# Patient Record
Sex: Female | Born: 1945 | Race: White | Hispanic: No | Smoking: Former smoker
Health system: Southern US, Community
[De-identification: ages and names within clinical notes are randomized; demographics above are authoritative.]

## PROBLEM LIST (undated history)

## (undated) DIAGNOSIS — E785 Hyperlipidemia, unspecified: Secondary | ICD-10-CM

## (undated) DIAGNOSIS — K624 Stenosis of anus and rectum: Secondary | ICD-10-CM

## (undated) DIAGNOSIS — K644 Residual hemorrhoidal skin tags: Secondary | ICD-10-CM

## (undated) DIAGNOSIS — I10 Essential (primary) hypertension: Secondary | ICD-10-CM

## (undated) DIAGNOSIS — N3281 Overactive bladder: Secondary | ICD-10-CM

## (undated) DIAGNOSIS — R000 Tachycardia, unspecified: Secondary | ICD-10-CM

## (undated) DIAGNOSIS — I77811 Abdominal aortic ectasia: Secondary | ICD-10-CM

## (undated) DIAGNOSIS — IMO0001 Reserved for inherently not codable concepts without codable children: Secondary | ICD-10-CM

## (undated) DIAGNOSIS — G459 Transient cerebral ischemic attack, unspecified: Secondary | ICD-10-CM

## (undated) DIAGNOSIS — I739 Peripheral vascular disease, unspecified: Secondary | ICD-10-CM

## (undated) DIAGNOSIS — IMO0002 Reserved for concepts with insufficient information to code with codable children: Secondary | ICD-10-CM

## (undated) DIAGNOSIS — Z923 Personal history of irradiation: Secondary | ICD-10-CM

## (undated) DIAGNOSIS — F4321 Adjustment disorder with depressed mood: Secondary | ICD-10-CM

## (undated) DIAGNOSIS — K589 Irritable bowel syndrome without diarrhea: Secondary | ICD-10-CM

## (undated) DIAGNOSIS — I679 Cerebrovascular disease, unspecified: Secondary | ICD-10-CM

## (undated) DIAGNOSIS — F419 Anxiety disorder, unspecified: Secondary | ICD-10-CM

## (undated) DIAGNOSIS — I951 Orthostatic hypotension: Secondary | ICD-10-CM

## (undated) DIAGNOSIS — C50919 Malignant neoplasm of unspecified site of unspecified female breast: Secondary | ICD-10-CM

## (undated) DIAGNOSIS — I872 Venous insufficiency (chronic) (peripheral): Secondary | ICD-10-CM

## (undated) DIAGNOSIS — K5900 Constipation, unspecified: Secondary | ICD-10-CM

## (undated) DIAGNOSIS — K602 Anal fissure, unspecified: Secondary | ICD-10-CM

## (undated) DIAGNOSIS — K222 Esophageal obstruction: Secondary | ICD-10-CM

## (undated) DIAGNOSIS — L814 Other melanin hyperpigmentation: Secondary | ICD-10-CM

## (undated) DIAGNOSIS — K200 Eosinophilic esophagitis: Secondary | ICD-10-CM

## (undated) DIAGNOSIS — K219 Gastro-esophageal reflux disease without esophagitis: Secondary | ICD-10-CM

## (undated) DIAGNOSIS — R32 Unspecified urinary incontinence: Secondary | ICD-10-CM

## (undated) HISTORY — DX: Reserved for concepts with insufficient information to code with codable children: IMO0002

## (undated) HISTORY — PX: ABDOMINAL HYSTERECTOMY: SHX81

## (undated) HISTORY — DX: Anxiety disorder, unspecified: F41.9

## (undated) HISTORY — DX: Abdominal aortic ectasia: I77.811

## (undated) HISTORY — DX: Anal fissure, unspecified: K60.2

## (undated) HISTORY — PX: BREAST SURGERY: SHX581

## (undated) HISTORY — DX: Eosinophilic esophagitis: K20.0

## (undated) HISTORY — DX: Constipation, unspecified: K59.00

## (undated) HISTORY — DX: Residual hemorrhoidal skin tags: K64.4

## (undated) HISTORY — DX: Irritable bowel syndrome without diarrhea: K58.9

## (undated) HISTORY — DX: Hyperlipidemia, unspecified: E78.5

## (undated) HISTORY — PX: OTHER SURGICAL HISTORY: SHX169

## (undated) HISTORY — DX: Adjustment disorder with depressed mood: F43.21

## (undated) HISTORY — DX: Tachycardia, unspecified: R00.0

## (undated) HISTORY — DX: Unspecified urinary incontinence: R32

## (undated) HISTORY — DX: Malignant neoplasm of unspecified site of unspecified female breast: C50.919

## (undated) HISTORY — DX: Peripheral vascular disease, unspecified: I73.9

## (undated) HISTORY — DX: Esophageal obstruction: K22.2

## (undated) HISTORY — DX: Venous insufficiency (chronic) (peripheral): I87.2

## (undated) HISTORY — DX: Essential (primary) hypertension: I10

## (undated) HISTORY — DX: Gastro-esophageal reflux disease without esophagitis: K21.9

## (undated) HISTORY — DX: Reserved for inherently not codable concepts without codable children: IMO0001

## (undated) HISTORY — DX: Cerebrovascular disease, unspecified: I67.9

## (undated) HISTORY — DX: Transient cerebral ischemic attack, unspecified: G45.9

## (undated) HISTORY — PX: CARDIAC CATHETERIZATION: SHX172

## (undated) HISTORY — DX: Stenosis of anus and rectum: K62.4

## (undated) HISTORY — DX: Orthostatic hypotension: I95.1

## (undated) HISTORY — DX: Other melanin hyperpigmentation: L81.4

## (undated) HISTORY — DX: Irritable bowel syndrome, unspecified: K58.9

## (undated) HISTORY — DX: Overactive bladder: N32.81

---

## 1970-10-22 HISTORY — PX: BREAST EXCISIONAL BIOPSY: SUR124

## 2000-07-24 ENCOUNTER — Encounter (INDEPENDENT_AMBULATORY_CARE_PROVIDER_SITE_OTHER): Payer: Self-pay | Admitting: Specialist

## 2000-07-24 ENCOUNTER — Other Ambulatory Visit: Admission: RE | Admit: 2000-07-24 | Discharge: 2000-07-24 | Payer: Self-pay | Admitting: Gastroenterology

## 2002-12-18 ENCOUNTER — Encounter: Payer: Self-pay | Admitting: Internal Medicine

## 2002-12-18 ENCOUNTER — Observation Stay (HOSPITAL_COMMUNITY): Admission: EM | Admit: 2002-12-18 | Discharge: 2002-12-19 | Payer: Self-pay | Admitting: Emergency Medicine

## 2002-12-18 ENCOUNTER — Encounter: Payer: Self-pay | Admitting: Emergency Medicine

## 2003-09-15 ENCOUNTER — Ambulatory Visit (HOSPITAL_COMMUNITY): Admission: RE | Admit: 2003-09-15 | Discharge: 2003-09-15 | Payer: Self-pay | Admitting: Cardiology

## 2003-09-30 ENCOUNTER — Ambulatory Visit (HOSPITAL_COMMUNITY): Admission: RE | Admit: 2003-09-30 | Discharge: 2003-09-30 | Payer: Self-pay | Admitting: Gastroenterology

## 2004-08-30 ENCOUNTER — Encounter: Admission: RE | Admit: 2004-08-30 | Discharge: 2004-08-30 | Payer: Self-pay | Admitting: Orthopedic Surgery

## 2004-09-11 ENCOUNTER — Ambulatory Visit: Payer: Self-pay | Admitting: Internal Medicine

## 2004-09-19 ENCOUNTER — Ambulatory Visit: Payer: Self-pay | Admitting: Internal Medicine

## 2004-09-19 ENCOUNTER — Encounter
Admission: RE | Admit: 2004-09-19 | Discharge: 2004-10-19 | Payer: Self-pay | Admitting: Physical Medicine and Rehabilitation

## 2004-10-31 ENCOUNTER — Ambulatory Visit: Payer: Self-pay | Admitting: Internal Medicine

## 2005-01-11 ENCOUNTER — Ambulatory Visit: Payer: Self-pay | Admitting: Internal Medicine

## 2005-01-13 ENCOUNTER — Ambulatory Visit (HOSPITAL_COMMUNITY): Admission: RE | Admit: 2005-01-13 | Discharge: 2005-01-13 | Payer: Self-pay | Admitting: Internal Medicine

## 2005-01-15 ENCOUNTER — Ambulatory Visit: Payer: Self-pay | Admitting: Internal Medicine

## 2005-02-20 ENCOUNTER — Ambulatory Visit: Payer: Self-pay | Admitting: Internal Medicine

## 2005-04-13 ENCOUNTER — Ambulatory Visit: Payer: Self-pay | Admitting: Gastroenterology

## 2005-04-23 ENCOUNTER — Ambulatory Visit: Payer: Self-pay | Admitting: Internal Medicine

## 2005-05-08 ENCOUNTER — Ambulatory Visit: Payer: Self-pay | Admitting: Gastroenterology

## 2005-05-14 ENCOUNTER — Ambulatory Visit: Payer: Self-pay | Admitting: Gastroenterology

## 2005-05-17 ENCOUNTER — Ambulatory Visit: Payer: Self-pay | Admitting: Internal Medicine

## 2005-07-20 ENCOUNTER — Ambulatory Visit: Payer: Self-pay | Admitting: Internal Medicine

## 2005-08-02 ENCOUNTER — Ambulatory Visit: Payer: Self-pay | Admitting: Internal Medicine

## 2005-09-14 ENCOUNTER — Ambulatory Visit: Payer: Self-pay | Admitting: Internal Medicine

## 2005-09-21 ENCOUNTER — Ambulatory Visit: Payer: Self-pay | Admitting: Internal Medicine

## 2006-01-31 ENCOUNTER — Ambulatory Visit: Payer: Self-pay | Admitting: Gastroenterology

## 2006-04-01 ENCOUNTER — Ambulatory Visit: Payer: Self-pay | Admitting: Internal Medicine

## 2006-04-16 ENCOUNTER — Ambulatory Visit: Payer: Self-pay | Admitting: Internal Medicine

## 2006-04-25 ENCOUNTER — Ambulatory Visit: Payer: Self-pay | Admitting: Internal Medicine

## 2006-06-12 ENCOUNTER — Ambulatory Visit: Payer: Self-pay | Admitting: Cardiology

## 2006-06-19 ENCOUNTER — Ambulatory Visit: Payer: Self-pay | Admitting: Internal Medicine

## 2006-07-08 ENCOUNTER — Ambulatory Visit: Payer: Self-pay | Admitting: Internal Medicine

## 2006-08-08 ENCOUNTER — Ambulatory Visit: Payer: Self-pay | Admitting: Internal Medicine

## 2006-10-17 ENCOUNTER — Ambulatory Visit: Payer: Self-pay | Admitting: Internal Medicine

## 2006-10-17 LAB — CONVERTED CEMR LAB
Chol/HDL Ratio, serum: 3.5
Cholesterol: 207 mg/dL (ref 0–200)
HDL: 58.6 mg/dL (ref >39.0)
LDL DIRECT: 134.5 mg/dL
Triglyceride fasting, serum: 58 mg/dL (ref 0–149)
VLDL: 12 mg/dL (ref 0–40)

## 2006-10-30 ENCOUNTER — Ambulatory Visit: Payer: Self-pay | Admitting: Internal Medicine

## 2006-10-30 HISTORY — PX: ELECTROCARDIOGRAM: SHX264

## 2007-04-11 ENCOUNTER — Ambulatory Visit: Payer: Self-pay | Admitting: Gastroenterology

## 2007-04-11 LAB — CONVERTED CEMR LAB
ALT: 22 units/L (ref 0–40)
AST: 22 units/L (ref 0–37)
Albumin: 3.9 g/dL (ref 3.5–5.2)
Alkaline Phosphatase: 78 units/L (ref 39–117)
Amylase: 69 units/L (ref 27–131)
BUN: 17 mg/dL (ref 6–23)
Basophils Absolute: 0.1 10*3/uL (ref 0.0–0.1)
Basophils Relative: 0.6 % (ref 0.0–1.0)
Bilirubin, Direct: 0.2 mg/dL (ref 0.0–0.3)
CO2: 34 meq/L — ABNORMAL HIGH (ref 19–32)
Calcium: 9.5 mg/dL (ref 8.4–10.5)
Chloride: 103 meq/L (ref 96–112)
Creatinine, Ser: 1 mg/dL (ref 0.4–1.2)
Eosinophils Absolute: 0.1 10*3/uL (ref 0.0–0.6)
Eosinophils Relative: 1.3 % (ref 0.0–5.0)
GFR calc Af Amer: 72 mL/min
GFR calc non Af Amer: 60 mL/min
Glucose, Bld: 94 mg/dL (ref 70–99)
HCT: 37.6 % (ref 36.0–46.0)
Hemoglobin: 12.8 g/dL (ref 12.0–15.0)
Lipase: 27 units/L (ref 11.0–59.0)
Lymphocytes Relative: 12.5 % (ref 12.0–46.0)
MCHC: 34.1 g/dL (ref 30.0–36.0)
MCV: 93 fL (ref 78.0–100.0)
Monocytes Absolute: 0.5 10*3/uL (ref 0.2–0.7)
Monocytes Relative: 6.3 % (ref 3.0–11.0)
Neutro Abs: 6.8 10*3/uL (ref 1.4–7.7)
Neutrophils Relative %: 79.3 % — ABNORMAL HIGH (ref 43.0–77.0)
Platelets: 282 10*3/uL (ref 150–400)
Potassium: 4.3 meq/L (ref 3.5–5.1)
RBC: 4.04 M/uL (ref 3.87–5.11)
RDW: 11.7 % (ref 11.5–14.6)
Sed Rate: 11 mm/hr (ref 0–25)
Sodium: 140 meq/L (ref 135–145)
TSH: 2.68 microintl units/mL (ref 0.35–5.50)
Total Bilirubin: 1.3 mg/dL — ABNORMAL HIGH (ref 0.3–1.2)
Total Protein: 6.7 g/dL (ref 6.0–8.3)
WBC: 8.6 10*3/uL (ref 4.5–10.5)

## 2007-04-15 ENCOUNTER — Ambulatory Visit: Payer: Self-pay | Admitting: Gastroenterology

## 2007-04-21 ENCOUNTER — Ambulatory Visit: Payer: Self-pay | Admitting: Gastroenterology

## 2007-04-21 HISTORY — PX: OTHER SURGICAL HISTORY: SHX169

## 2007-05-10 ENCOUNTER — Encounter: Payer: Self-pay | Admitting: Internal Medicine

## 2007-05-10 DIAGNOSIS — I10 Essential (primary) hypertension: Secondary | ICD-10-CM | POA: Insufficient documentation

## 2007-05-10 DIAGNOSIS — E785 Hyperlipidemia, unspecified: Secondary | ICD-10-CM | POA: Insufficient documentation

## 2007-05-10 DIAGNOSIS — K219 Gastro-esophageal reflux disease without esophagitis: Secondary | ICD-10-CM

## 2007-05-10 HISTORY — DX: Gastro-esophageal reflux disease without esophagitis: K21.9

## 2007-05-10 HISTORY — DX: Hyperlipidemia, unspecified: E78.5

## 2007-05-10 HISTORY — DX: Essential (primary) hypertension: I10

## 2007-05-20 ENCOUNTER — Ambulatory Visit: Payer: Self-pay | Admitting: Gastroenterology

## 2007-07-31 ENCOUNTER — Ambulatory Visit: Payer: Self-pay | Admitting: Internal Medicine

## 2007-08-02 ENCOUNTER — Ambulatory Visit: Payer: Self-pay | Admitting: Family Medicine

## 2007-08-25 LAB — CONVERTED CEMR LAB: Pap Smear: NORMAL

## 2007-11-19 ENCOUNTER — Ambulatory Visit: Payer: Self-pay | Admitting: Internal Medicine

## 2007-11-19 LAB — CONVERTED CEMR LAB
ALT: 18 units/L (ref 0–35)
AST: 24 units/L (ref 0–37)
Albumin: 4.3 g/dL (ref 3.5–5.2)
Alkaline Phosphatase: 72 units/L (ref 39–117)
BUN: 14 mg/dL (ref 6–23)
Basophils Absolute: 0.1 10*3/uL (ref 0.0–0.1)
Basophils Relative: 1 % (ref 0.0–1.0)
Bilirubin Urine: NEGATIVE
Bilirubin, Direct: 0.2 mg/dL (ref 0.0–0.3)
CO2: 33 meq/L — ABNORMAL HIGH (ref 19–32)
Calcium: 10.2 mg/dL (ref 8.4–10.5)
Chloride: 103 meq/L (ref 96–112)
Cholesterol: 229 mg/dL (ref 0–200)
Creatinine, Ser: 0.7 mg/dL (ref 0.4–1.2)
Direct LDL: 137.1 mg/dL
Eosinophils Absolute: 0.2 10*3/uL (ref 0.0–0.6)
Eosinophils Relative: 2.5 % (ref 0.0–5.0)
GFR calc Af Amer: 108 mL/min
GFR calc non Af Amer: 90 mL/min
Glucose, Bld: 100 mg/dL — ABNORMAL HIGH (ref 70–99)
HCT: 38 % (ref 36.0–46.0)
HDL: 60.7 mg/dL (ref >39.0)
Hemoglobin: 12.7 g/dL (ref 12.0–15.0)
Ketones, ur: NEGATIVE mg/dL
Leukocytes, UA: NEGATIVE
Lymphocytes Relative: 16.4 % (ref 12.0–46.0)
MCHC: 33.3 g/dL (ref 30.0–36.0)
MCV: 94.7 fL (ref 78.0–100.0)
Monocytes Absolute: 0.4 10*3/uL (ref 0.2–0.7)
Monocytes Relative: 5.1 % (ref 3.0–11.0)
Neutro Abs: 6.2 10*3/uL (ref 1.4–7.7)
Neutrophils Relative %: 75 % (ref 43.0–77.0)
Nitrite: NEGATIVE
Platelets: 267 10*3/uL (ref 150–400)
Potassium: 4.8 meq/L (ref 3.5–5.1)
RBC: 4.01 M/uL (ref 3.87–5.11)
RDW: 11.6 % (ref 11.5–14.6)
Sodium: 142 meq/L (ref 135–145)
Specific Gravity, Urine: 1.025 (ref 1.000–1.03)
TSH: 2.75 microintl units/mL (ref 0.35–5.50)
Total Bilirubin: 1.2 mg/dL (ref 0.3–1.2)
Total CHOL/HDL Ratio: 3.8
Total Protein, Urine: NEGATIVE mg/dL
Total Protein: 6.7 g/dL (ref 6.0–8.3)
Triglycerides: 75 mg/dL (ref 0–149)
Urine Glucose: NEGATIVE mg/dL
Urobilinogen, UA: 0.2 (ref 0.0–1.0)
VLDL: 15 mg/dL (ref 0–40)
WBC: 8.3 10*3/uL (ref 4.5–10.5)
pH: 5.5 (ref 5.0–8.0)

## 2007-11-27 ENCOUNTER — Ambulatory Visit: Payer: Self-pay | Admitting: Internal Medicine

## 2008-04-19 ENCOUNTER — Ambulatory Visit: Payer: Self-pay | Admitting: Internal Medicine

## 2008-04-19 LAB — CONVERTED CEMR LAB
Free T4: 1 ng/dL (ref 0.6–1.6)
TSH: 2.67 microintl units/mL (ref 0.35–5.50)

## 2008-04-20 ENCOUNTER — Encounter: Payer: Self-pay | Admitting: Internal Medicine

## 2008-04-26 ENCOUNTER — Encounter: Payer: Self-pay | Admitting: Internal Medicine

## 2008-04-26 ENCOUNTER — Ambulatory Visit: Payer: Self-pay

## 2008-04-27 ENCOUNTER — Telehealth: Payer: Self-pay | Admitting: Internal Medicine

## 2008-05-01 ENCOUNTER — Encounter: Payer: Self-pay | Admitting: Internal Medicine

## 2008-06-11 ENCOUNTER — Encounter: Payer: Self-pay | Admitting: Internal Medicine

## 2008-08-13 ENCOUNTER — Ambulatory Visit: Payer: Self-pay | Admitting: Internal Medicine

## 2009-01-10 ENCOUNTER — Ambulatory Visit: Payer: Self-pay | Admitting: Internal Medicine

## 2009-01-11 ENCOUNTER — Telehealth: Payer: Self-pay | Admitting: Gastroenterology

## 2009-01-12 DIAGNOSIS — K644 Residual hemorrhoidal skin tags: Secondary | ICD-10-CM

## 2009-01-12 DIAGNOSIS — K222 Esophageal obstruction: Secondary | ICD-10-CM

## 2009-01-12 DIAGNOSIS — K5900 Constipation, unspecified: Secondary | ICD-10-CM

## 2009-01-12 DIAGNOSIS — K589 Irritable bowel syndrome without diarrhea: Secondary | ICD-10-CM | POA: Insufficient documentation

## 2009-01-12 HISTORY — DX: Irritable bowel syndrome, unspecified: K58.9

## 2009-01-12 HISTORY — DX: Residual hemorrhoidal skin tags: K64.4

## 2009-01-12 HISTORY — DX: Esophageal obstruction: K22.2

## 2009-01-12 HISTORY — DX: Constipation, unspecified: K59.00

## 2009-01-14 ENCOUNTER — Ambulatory Visit: Payer: Self-pay | Admitting: Gastroenterology

## 2009-01-17 ENCOUNTER — Ambulatory Visit: Payer: Self-pay | Admitting: Gastroenterology

## 2009-01-17 LAB — HM COLONOSCOPY

## 2009-01-27 ENCOUNTER — Ambulatory Visit: Payer: Self-pay | Admitting: Internal Medicine

## 2009-01-27 LAB — CONVERTED CEMR LAB
ALT: 26 units/L (ref 0–35)
AST: 26 units/L (ref 0–37)
Albumin: 4 g/dL (ref 3.5–5.2)
Alkaline Phosphatase: 69 units/L (ref 39–117)
BUN: 16 mg/dL (ref 6–23)
Basophils Absolute: 0 10*3/uL (ref 0.0–0.1)
Basophils Relative: 0.1 % (ref 0.0–3.0)
Bilirubin Urine: NEGATIVE
Bilirubin, Direct: 0.2 mg/dL (ref 0.0–0.3)
CO2: 32 meq/L (ref 19–32)
Calcium: 9.6 mg/dL (ref 8.4–10.5)
Chloride: 107 meq/L (ref 96–112)
Cholesterol: 210 mg/dL — ABNORMAL HIGH (ref 0–200)
Creatinine, Ser: 0.7 mg/dL (ref 0.4–1.2)
Direct LDL: 137.5 mg/dL
Eosinophils Absolute: 0.2 10*3/uL (ref 0.0–0.7)
Eosinophils Relative: 2.4 % (ref 0.0–5.0)
GFR calc non Af Amer: 89.2 mL/min (ref >60)
Glucose, Bld: 96 mg/dL (ref 70–99)
HCT: 36.2 % (ref 36.0–46.0)
HDL: 56.1 mg/dL (ref >39.00)
Hemoglobin: 12.3 g/dL (ref 12.0–15.0)
Ketones, ur: NEGATIVE mg/dL
Leukocytes, UA: NEGATIVE
Lymphocytes Relative: 18.4 % (ref 12.0–46.0)
Lymphs Abs: 1.2 10*3/uL (ref 0.7–4.0)
MCHC: 33.9 g/dL (ref 30.0–36.0)
MCV: 95.1 fL (ref 78.0–100.0)
Monocytes Absolute: 0.4 10*3/uL (ref 0.1–1.0)
Monocytes Relative: 6.2 % (ref 3.0–12.0)
Neutro Abs: 4.7 10*3/uL (ref 1.4–7.7)
Neutrophils Relative %: 72.9 % (ref 43.0–77.0)
Nitrite: NEGATIVE
Platelets: 253 10*3/uL (ref 150.0–400.0)
Potassium: 4.7 meq/L (ref 3.5–5.1)
RBC: 3.81 M/uL — ABNORMAL LOW (ref 3.87–5.11)
RDW: 11.5 % (ref 11.5–14.6)
Sodium: 145 meq/L (ref 135–145)
Specific Gravity, Urine: 1.025 (ref 1.000–1.030)
TSH: 2.12 microintl units/mL (ref 0.35–5.50)
Total Bilirubin: 0.9 mg/dL (ref 0.3–1.2)
Total CHOL/HDL Ratio: 4
Total Protein, Urine: NEGATIVE mg/dL
Total Protein: 6.5 g/dL (ref 6.0–8.3)
Triglycerides: 66 mg/dL (ref 0.0–149.0)
Urine Glucose: NEGATIVE mg/dL
Urobilinogen, UA: 0.2 (ref 0.0–1.0)
VLDL: 13.2 mg/dL (ref 0.0–40.0)
WBC: 6.5 10*3/uL (ref 4.5–10.5)
pH: 5.5 (ref 5.0–8.0)

## 2009-01-30 ENCOUNTER — Encounter: Payer: Self-pay | Admitting: Internal Medicine

## 2009-02-16 DIAGNOSIS — K602 Anal fissure, unspecified: Secondary | ICD-10-CM

## 2009-02-16 DIAGNOSIS — K624 Stenosis of anus and rectum: Secondary | ICD-10-CM

## 2009-02-16 HISTORY — DX: Stenosis of anus and rectum: K62.4

## 2009-02-16 HISTORY — DX: Anal fissure, unspecified: K60.2

## 2009-02-17 ENCOUNTER — Ambulatory Visit: Payer: Self-pay | Admitting: Gastroenterology

## 2009-06-22 LAB — CONVERTED CEMR LAB: Pap Smear: NORMAL

## 2009-06-30 ENCOUNTER — Encounter: Payer: Self-pay | Admitting: Internal Medicine

## 2009-07-20 ENCOUNTER — Encounter: Payer: Self-pay | Admitting: Internal Medicine

## 2009-08-03 ENCOUNTER — Ambulatory Visit: Payer: Self-pay | Admitting: Internal Medicine

## 2009-08-19 ENCOUNTER — Ambulatory Visit: Payer: Self-pay | Admitting: Internal Medicine

## 2009-09-02 ENCOUNTER — Ambulatory Visit: Payer: Self-pay | Admitting: Internal Medicine

## 2009-09-03 DIAGNOSIS — R000 Tachycardia, unspecified: Secondary | ICD-10-CM

## 2009-09-03 HISTORY — DX: Tachycardia, unspecified: R00.0

## 2010-01-31 ENCOUNTER — Ambulatory Visit: Payer: Self-pay | Admitting: Internal Medicine

## 2010-01-31 DIAGNOSIS — I77811 Abdominal aortic ectasia: Secondary | ICD-10-CM

## 2010-01-31 HISTORY — DX: Abdominal aortic ectasia: I77.811

## 2010-01-31 LAB — CONVERTED CEMR LAB
ALT: 26 units/L (ref 0–35)
AST: 24 units/L (ref 0–37)
Albumin: 4.5 g/dL (ref 3.5–5.2)
Alkaline Phosphatase: 85 units/L (ref 39–117)
BUN: 15 mg/dL (ref 6–23)
Basophils Absolute: 0.1 10*3/uL (ref 0.0–0.1)
Basophils Relative: 0.7 % (ref 0.0–3.0)
Bilirubin, Direct: 0.1 mg/dL (ref 0.0–0.3)
CO2: 34 meq/L — ABNORMAL HIGH (ref 19–32)
Calcium: 10.2 mg/dL (ref 8.4–10.5)
Chloride: 103 meq/L (ref 96–112)
Cholesterol: 229 mg/dL — ABNORMAL HIGH (ref 0–200)
Creatinine, Ser: 0.7 mg/dL (ref 0.4–1.2)
Direct LDL: 144.7 mg/dL
Eosinophils Absolute: 0.2 10*3/uL (ref 0.0–0.7)
Eosinophils Relative: 2 % (ref 0.0–5.0)
Folate: 12.7 ng/mL
GFR calc non Af Amer: 88.92 mL/min (ref >60)
Glucose, Bld: 98 mg/dL (ref 70–99)
HCT: 37.3 % (ref 36.0–46.0)
HDL: 69.4 mg/dL (ref >39.00)
Hemoglobin: 12.9 g/dL (ref 12.0–15.0)
Lymphocytes Relative: 17.5 % (ref 12.0–46.0)
Lymphs Abs: 1.4 10*3/uL (ref 0.7–4.0)
MCHC: 34.6 g/dL (ref 30.0–36.0)
MCV: 93.6 fL (ref 78.0–100.0)
Monocytes Absolute: 0.4 10*3/uL (ref 0.1–1.0)
Monocytes Relative: 4.7 % (ref 3.0–12.0)
Neutro Abs: 5.9 10*3/uL (ref 1.4–7.7)
Neutrophils Relative %: 75.1 % (ref 43.0–77.0)
Platelets: 246 10*3/uL (ref 150.0–400.0)
Potassium: 4.2 meq/L (ref 3.5–5.1)
RBC: 3.98 M/uL (ref 3.87–5.11)
RDW: 12.3 % (ref 11.5–14.6)
Sodium: 145 meq/L (ref 135–145)
TSH: 1.9 microintl units/mL (ref 0.35–5.50)
Total Bilirubin: 0.9 mg/dL (ref 0.3–1.2)
Total CHOL/HDL Ratio: 3
Total Protein: 7.3 g/dL (ref 6.0–8.3)
Triglycerides: 103 mg/dL (ref 0.0–149.0)
VLDL: 20.6 mg/dL (ref 0.0–40.0)
Vitamin B-12: 370 pg/mL (ref 211–911)
WBC: 7.9 10*3/uL (ref 4.5–10.5)

## 2010-02-03 ENCOUNTER — Encounter: Admission: RE | Admit: 2010-02-03 | Discharge: 2010-02-03 | Payer: Self-pay | Admitting: Internal Medicine

## 2010-02-05 ENCOUNTER — Telehealth: Payer: Self-pay | Admitting: Internal Medicine

## 2010-03-02 ENCOUNTER — Ambulatory Visit: Payer: Self-pay | Admitting: Internal Medicine

## 2010-03-02 DIAGNOSIS — G459 Transient cerebral ischemic attack, unspecified: Secondary | ICD-10-CM

## 2010-03-02 HISTORY — DX: Transient cerebral ischemic attack, unspecified: G45.9

## 2010-03-08 ENCOUNTER — Ambulatory Visit (HOSPITAL_COMMUNITY): Admission: RE | Admit: 2010-03-08 | Discharge: 2010-03-08 | Payer: Self-pay | Admitting: Internal Medicine

## 2010-03-08 ENCOUNTER — Telehealth: Payer: Self-pay | Admitting: Internal Medicine

## 2010-06-16 ENCOUNTER — Encounter: Payer: Self-pay | Admitting: Internal Medicine

## 2010-06-16 ENCOUNTER — Ambulatory Visit: Payer: Self-pay | Admitting: Gastroenterology

## 2010-06-16 ENCOUNTER — Ambulatory Visit: Payer: Self-pay | Admitting: Internal Medicine

## 2010-06-16 DIAGNOSIS — I679 Cerebrovascular disease, unspecified: Secondary | ICD-10-CM

## 2010-06-16 HISTORY — DX: Cerebrovascular disease, unspecified: I67.9

## 2010-06-16 LAB — CONVERTED CEMR LAB
BUN: 11 mg/dL (ref 6–23)
Basophils Absolute: 0 10*3/uL (ref 0.0–0.1)
Basophils Relative: 0.3 % (ref 0.0–3.0)
CO2: 32 meq/L (ref 19–32)
Calcium: 10 mg/dL (ref 8.4–10.5)
Chloride: 104 meq/L (ref 96–112)
Creatinine, Ser: 0.6 mg/dL (ref 0.4–1.2)
Eosinophils Absolute: 0 10*3/uL (ref 0.0–0.7)
Eosinophils Relative: 0.4 % (ref 0.0–5.0)
GFR calc non Af Amer: 98.5 mL/min (ref >60)
Glucose, Bld: 100 mg/dL — ABNORMAL HIGH (ref 70–99)
HCT: 38 % (ref 36.0–46.0)
Hemoglobin: 12.9 g/dL (ref 12.0–15.0)
Lymphocytes Relative: 15.1 % (ref 12.0–46.0)
Lymphs Abs: 1.5 10*3/uL (ref 0.7–4.0)
MCHC: 34 g/dL (ref 30.0–36.0)
MCV: 95.8 fL (ref 78.0–100.0)
Monocytes Absolute: 0.5 10*3/uL (ref 0.1–1.0)
Monocytes Relative: 4.8 % (ref 3.0–12.0)
Neutro Abs: 7.6 10*3/uL (ref 1.4–7.7)
Neutrophils Relative %: 79.4 % — ABNORMAL HIGH (ref 43.0–77.0)
Platelets: 280 10*3/uL (ref 150.0–400.0)
Potassium: 4.5 meq/L (ref 3.5–5.1)
RBC: 3.96 M/uL (ref 3.87–5.11)
RDW: 12.8 % (ref 11.5–14.6)
Sodium: 145 meq/L (ref 135–145)
WBC: 9.6 10*3/uL (ref 4.5–10.5)

## 2010-06-17 DIAGNOSIS — I739 Peripheral vascular disease, unspecified: Secondary | ICD-10-CM

## 2010-06-17 HISTORY — DX: Peripheral vascular disease, unspecified: I73.9

## 2010-06-20 ENCOUNTER — Encounter (INDEPENDENT_AMBULATORY_CARE_PROVIDER_SITE_OTHER): Payer: Self-pay | Admitting: *Deleted

## 2010-06-20 ENCOUNTER — Ambulatory Visit: Payer: Self-pay | Admitting: Gastroenterology

## 2010-06-21 ENCOUNTER — Ambulatory Visit: Payer: Self-pay | Admitting: Internal Medicine

## 2010-06-28 ENCOUNTER — Ambulatory Visit: Payer: Self-pay | Admitting: Gastroenterology

## 2010-07-04 ENCOUNTER — Encounter: Payer: Self-pay | Admitting: Gastroenterology

## 2010-07-04 ENCOUNTER — Encounter: Payer: Self-pay | Admitting: Internal Medicine

## 2010-07-04 LAB — HM MAMMOGRAPHY: HM Mammogram: NORMAL

## 2010-07-06 ENCOUNTER — Telehealth: Payer: Self-pay | Admitting: Internal Medicine

## 2010-07-14 ENCOUNTER — Ambulatory Visit: Payer: Self-pay | Admitting: Gastroenterology

## 2010-07-18 ENCOUNTER — Ambulatory Visit: Payer: Self-pay | Admitting: Internal Medicine

## 2010-07-18 DIAGNOSIS — I872 Venous insufficiency (chronic) (peripheral): Secondary | ICD-10-CM

## 2010-07-18 HISTORY — DX: Venous insufficiency (chronic) (peripheral): I87.2

## 2010-08-15 ENCOUNTER — Ambulatory Visit: Payer: Self-pay | Admitting: Gastroenterology

## 2010-08-21 ENCOUNTER — Ambulatory Visit: Payer: Self-pay | Admitting: Gastroenterology

## 2010-08-21 ENCOUNTER — Ambulatory Visit (HOSPITAL_COMMUNITY): Admission: RE | Admit: 2010-08-21 | Discharge: 2010-08-21 | Payer: Self-pay | Admitting: Gastroenterology

## 2010-11-07 ENCOUNTER — Ambulatory Visit
Admission: RE | Admit: 2010-11-07 | Discharge: 2010-11-07 | Payer: Self-pay | Source: Home / Self Care | Attending: Gastroenterology | Admitting: Gastroenterology

## 2010-11-07 ENCOUNTER — Other Ambulatory Visit: Payer: Self-pay | Admitting: Gastroenterology

## 2010-11-07 ENCOUNTER — Encounter: Payer: Self-pay | Admitting: Gastroenterology

## 2010-11-07 LAB — CBC WITH DIFFERENTIAL/PLATELET
Basophils Absolute: 0 10*3/uL (ref 0.0–0.1)
Basophils Relative: 0.3 % (ref 0.0–3.0)
Eosinophils Absolute: 0 10*3/uL (ref 0.0–0.7)
Eosinophils Relative: 0.5 % (ref 0.0–5.0)
HCT: 38.3 % (ref 36.0–46.0)
Hemoglobin: 13.1 g/dL (ref 12.0–15.0)
Lymphocytes Relative: 14.1 % (ref 12.0–46.0)
Lymphs Abs: 1 10*3/uL (ref 0.7–4.0)
MCHC: 34.1 g/dL (ref 30.0–36.0)
MCV: 94.6 fl (ref 78.0–100.0)
Monocytes Absolute: 0.4 10*3/uL (ref 0.1–1.0)
Monocytes Relative: 4.8 % (ref 3.0–12.0)
Neutro Abs: 5.9 10*3/uL (ref 1.4–7.7)
Neutrophils Relative %: 80.3 % — ABNORMAL HIGH (ref 43.0–77.0)
Platelets: 274 10*3/uL (ref 150.0–400.0)
RBC: 4.05 Mil/uL (ref 3.87–5.11)
RDW: 12.2 % (ref 11.5–14.6)
WBC: 7.4 10*3/uL (ref 4.5–10.5)

## 2010-11-07 LAB — HEPATIC FUNCTION PANEL
ALT: 18 U/L (ref 0–35)
AST: 23 U/L (ref 0–37)
Albumin: 4.3 g/dL (ref 3.5–5.2)
Alkaline Phosphatase: 96 U/L (ref 39–117)
Bilirubin, Direct: 0.1 mg/dL (ref 0.0–0.3)
Total Bilirubin: 1.1 mg/dL (ref 0.3–1.2)
Total Protein: 7 g/dL (ref 6.0–8.3)

## 2010-11-07 LAB — FOLATE: Folate: 19.3 ng/mL (ref >5.9)

## 2010-11-07 LAB — SEDIMENTATION RATE: Sed Rate: 9 mm/hr (ref 0–22)

## 2010-11-07 LAB — BASIC METABOLIC PANEL
BUN: 16 mg/dL (ref 6–23)
CO2: 30 mEq/L (ref 19–32)
Calcium: 9.8 mg/dL (ref 8.4–10.5)
Chloride: 103 mEq/L (ref 96–112)
Creatinine, Ser: 0.7 mg/dL (ref 0.4–1.2)
GFR: 90.2 mL/min (ref >60.00)
Glucose, Bld: 109 mg/dL — ABNORMAL HIGH (ref 70–99)
Potassium: 4.9 mEq/L (ref 3.5–5.1)
Sodium: 142 mEq/L (ref 135–145)

## 2010-11-07 LAB — TSH: TSH: 1.67 u[IU]/mL (ref 0.35–5.50)

## 2010-11-07 LAB — IBC PANEL
Iron: 92 ug/dL (ref 42–145)
Saturation Ratios: 26 % (ref 20.0–50.0)
Transferrin: 252.8 mg/dL (ref 212.0–360.0)

## 2010-11-07 LAB — IGA: IgA: 200 mg/dL (ref 68–378)

## 2010-11-07 LAB — CONVERTED CEMR LAB: Tissue Transglutaminase Ab, IgA: 3.8 units (ref <20)

## 2010-11-07 LAB — FERRITIN: Ferritin: 35.8 ng/mL (ref 10.0–291.0)

## 2010-11-07 LAB — VITAMIN B12: Vitamin B-12: 354 pg/mL (ref 211–911)

## 2010-11-12 ENCOUNTER — Encounter: Payer: Self-pay | Admitting: Internal Medicine

## 2010-11-14 ENCOUNTER — Ambulatory Visit (HOSPITAL_COMMUNITY)
Admission: RE | Admit: 2010-11-14 | Discharge: 2010-11-14 | Payer: Self-pay | Source: Home / Self Care | Attending: Gastroenterology | Admitting: Gastroenterology

## 2010-11-21 NOTE — Procedures (Signed)
Summary: MANOMETRY  NORMAL MANOMETRY: 1. UES--NORMAL 2.LES NORMAL,MEAN PRESSURE 40MMHG,NORMAL RELAXATION 3.NORMAL MOTILITY,90% PERISTALSIS,MEAN AMPLITUDE 58 MM HG.  NORMAL ESOPHAGEAL MANOMETRY  Appended Document: MANOMETRY pt aware

## 2010-11-21 NOTE — Letter (Signed)
Summary: Patient Notice-Endo Biopsy Results  [facility]  [address] [place], [place] [address]   Phone: [phone]  Fax: [phone]        July 04, 2010 MRN: [id number]    Crystal [name] [address] [place], [place]  [address]    Dear Crystal [name],  I am pleased to inform you that the biopsies taken during your recent endoscopic examination did not show any evidence of cancer upon pathologic examination.  Additional information/recommendations:  __No further action is needed at this time.  Please follow-up with      your primary care physician for your other healthcare needs.  _x_ Please call [phone] to schedule a return visit to review      your condition.  __ Continue with the treatment plan as outlined on the day of your      exam.  __ You should have a repeat endoscopic examination for this problem              in _ months/years.   Please call us if you are having persistent problems or have questions about your condition that have not been fully answered at this time.  Sincerely,  Mardella Layman MD [facility]  This letter has been electronically signed by your physician.  Appended Document: Patient Notice-Endo Biopsy Results letter mailed

## 2010-11-21 NOTE — Letter (Signed)
Summary: EGD Instructions  [facility]  [address] [place], [place] [address]   Phone: [phone]  Fax: [phone]       Crystal [name]    26-Oct-1945    MRN: [id number]       Procedure Day Dorna Bloom: Wednesday, 06/28/10     Arrival Time: 10:30     Procedure Time: 11:30     Location of Procedure:                    _X  _ [facility] (4th Floor)    PREPARATION FOR ENDOSCOPY   On 06/28/10 THE DAY OF THE PROCEDURE:  1.   No solid foods, milk or milk products are allowed after midnight the night before your procedure.  2.   Do not drink anything colored red or purple.  Avoid juices with pulp.  No orange juice.  3.  You may drink clear liquids until 9:30, which is 2 hours before your procedure.                                                                                                CLEAR LIQUIDS INCLUDE: Water Jello Ice Popsicles Tea (sugar ok, no milk/cream) Powdered fruit flavored drinks Coffee (sugar ok, no milk/cream) Gatorade Juice: apple, white grape, white cranberry  Lemonade Clear bullion, consomm, broth Carbonated beverages (any kind) Strained chicken noodle soup Hard Candy   MEDICATION INSTRUCTIONS  Unless otherwise instructed, you should take regular prescription medications with a small sip of water as early as possible the morning of your procedure.                     OTHER INSTRUCTIONS  You will need a responsible adult at least 67 years of age to accompany you and drive you home.   This person must remain in the waiting room during your procedure.  Wear loose fitting clothing that is easily removed.  Leave jewelry and other valuables at home.  However, you may wish to bring a book to read or an iPod/MP3 player to listen to music as you wait for your procedure to start.  Remove all body piercing jewelry and leave at home.  Total time from sign-in until discharge is approximately 2-3 hours.  You  should go home directly after your procedure and rest.  You can resume normal activities the day after your procedure.  The day of your procedure you should not:   Drive   Make legal decisions   Operate machinery   Drink alcohol   Return to work  You will receive specific instructions about eating, activities and medications before you leave.    The above instructions have been reviewed and explained to me by   _______________________    I fully understand and can verbalize these instructions _____________________________ Date _________

## 2010-11-21 NOTE — Assessment & Plan Note (Signed)
Summary: trouble swallowing...e.m    History of Present Illness Visit Type: Follow-up Visit Primary GI MD: Sheryn Bison MD FACP FAGA Primary Provider: Illene Regulus, MD Requesting Provider: n/a Chief Complaint: dysphagia with solids and liquids after meals History of Present Illness:   Crystal [name] has chest pain, shortness of breath, and tachycardia. Refer to primary care for further evaluation before addressing her GI complaints.   GI Review of Systems    Reports dysphagia with liquids and  dysphagia with solids.      Denies abdominal pain, acid reflux, belching, bloating, chest pain, heartburn, loss of appetite, nausea, vomiting, vomiting blood, weight loss, and  weight gain.        Denies anal fissure, black tarry stools, change in bowel habit, constipation, diarrhea, diverticulosis, fecal incontinence, heme positive stool, hemorrhoids, irritable bowel syndrome, jaundice, light color stool, liver problems, rectal bleeding, and  rectal pain.    Current Medications (verified): 1)  Bayer Aspirin 325 Mg  Tabs (Aspirin) .... Take 1 By Mouth Qd 2)  Perdiem Overnight Relief 15 Mg  Tabs (Sennosides) .... Take 2 At Bedtime Prn 3)  Miralax   Powd (Polyethylene Glycol 3350) .... Take 17 Grams Qd 4)  Canasa 1000 Mg  Supp (Mesalamine) .... Insert One Into Rectum As Needed 5)  Amlodipine Besylate 5 Mg Tabs (Amlodipine Besylate) .Marland Kitchen.. 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 Lives alone. I-ADLs End of Life: no resuscitation, no mechanical ventilation, no futile heroic measures. Patient is a former smoker. quit 40 years ago Alcohol Use - yes occ wine Daily Caffeine Use 2 cups coffee/day Illicit Drug Use - no  Vital Signs:  Patient profile:   65 year old female Height:      63 inches Weight:      106 pounds BMI:     18.84 BSA:     1.48 Pulse rate:   128 / minute Pulse rhythm:   regular BP sitting:   170 / 100  (left arm)  Vitals Entered By: Merri Ray CMA [name] Dull) (June 16, 2010 11:04 AM)  Physical Exam  General:  Well developed, well nourished, no acute distress.healthy appearing.   Head:  Normocephalic and atraumatic. Eyes:  PERRLA, no icterus.exam deferred to patient's ophthalmologist.   Lungs:  Clear throughout to auscultation. Heart:  Regular rate and rhythm; no murmurs, rubs,  or bruits.tachycardic but appears to be in a regular rhythm. Abdomen:  Soft, nontender and nondistended. No masses, hepatosplenomegaly or hernias noted. Normal bowel sounds. Extremities:  No clubbing, cyanosis, edema or deformities noted. Neurologic:  Alert and  oriented x4;  grossly normal neurologically.   Impression & Recommendations:  Problem # 1:  TACHYARRHYTHMIA (  ICD-785.0) Assessment Deteriorated Referral to primary care for  cardiopulmonary evaluation before proceeding with further GI testing. This patient does have hypertension with an elevated blood pressure today, and recently had had MRI which demonstrated cerebral vascular disease.  Patient Instructions: 1)  referral to primary care

## 2010-11-21 NOTE — Procedures (Signed)
Summary: Upper Endoscopy  Patient: Crystal [name] Note: All result statuses are Final unless otherwise noted.  Tests: (1) Upper Endoscopy (EGD)   EGD Upper Endoscopy       DONE     [facility]     [address]     [place], [place]  [address]           ENDOSCOPY PROCEDURE REPORT           PATIENT:  [name], [name]  MR#:  [id number]     BIRTHDATE:  1946/06/16, 64 yrs. old  GENDER:  female           ENDOSCOPIST:  Vania Rea. Jarold Motto, MD, [facility]     Referred by:           PROCEDURE DATE:  06/28/2010     PROCEDURE:  EGD with biopsy     ASA CLASS:  Class II     INDICATIONS:  chest pain           MEDICATIONS:   Fentanyl 50 mcg IV, Versed 6 mg IV     TOPICAL ANESTHETIC:  Exactacain Spray           DESCRIPTION OF PROCEDURE:   After the risks benefits and     alternatives of the procedure were thoroughly explained, informed     consent was obtained.  The LB [id number]-H180 [id number] endoscope was     introduced through the mouth and advanced to the second portion of     the duodenum, without limitations.  The instrument was slowly     withdrawn as the mucosa was fully examined.     <<PROCEDUREIMAGES>>           The upper, middle, and distal third of the esophagus were     carefully inspected and no abnormalities were noted. The z-line     was well seen at the GEJ. The endoscope was pushed into the fundus     which was normal including a retroflexed view. The antrum,gastric     body, first and second part of the duodenum were unremarkable.     ESOPHAGEAL BIOPSIES DONE.    Retroflexed views revealed no     abnormalities.    The scope was then withdrawn from the patient     and the procedure completed.           COMPLICATIONS:  None           ENDOSCOPIC IMPRESSION:     1) Normal EGD     R/O EOSINOPHILIC ESOPHAGITIS VS. MOTILITY DISORDER.     RECOMMENDATIONS:     1) Await biopsy results     CONSIDER MANOMETRY AND 24H PH PROBE.           REPEAT EXAM:  No        ______________________________     Vania Rea. Jarold Motto, MD, [facility]           CC:  Jacques Navy, MD           n.     Rosalie DoctorMarland Kitchen   Vania Rea. Haidy Kackley at 06/28/2010 12:13 PM           Jeral Fruit, [id number]  Note: An exclamation mark (!) indicates a result that was not dispersed into the flowsheet. Document Creation Date: 06/28/2010 12:14 PM _______________________________________________________________________  (1) Order result status: Final Collection or observation date-time: 06/28/2010 12:06 Requested date-time:  Receipt date-time:  Reported date-time:  Referring Physician:   Ordering Physician:  Sheryn Bison [phone]) Specimen Source:  Source: Launa Grill Order Number: [id number] Lab site:

## 2010-11-21 NOTE — Assessment & Plan Note (Signed)
Summary: f/u to 8/26 appt/cd   Vital Signs:  Patient profile:   65 year old female Height:      62.5 inches Weight:      106 pounds BMI:     19.15 O2 Sat:      99 % on Room air Temp:     97.5 degrees F oral Pulse rate:   87 / minute BP sitting:   148 / 86  (left arm) Cuff size:   regular  Vitals Entered By: Bill Salinas CMA (June 21, 2010 11:10 AM)  O2 Flow:  Room air CC: follow-up visit on bp/ ab   Primary Care Provider:  Illene Regulus, MD  CC:  follow-up visit on bp/ ab.  History of Present Illness: Patient was seen last week by Dr. Jonny Ruiz: she was urgently referred from GI due to high BP. Reviewed Dr. Raphael Gibney note, labs, EKG. She had been started on benicar 20mg  once daily and stopped amlodipine. She returns for follow-up. She reports that Benicar has made her feel "draggy" and her get up and go got up and went. BP remains suboptimally controlled.  Current Medications (verified): 1)  Bayer Aspirin 325 Mg  Tabs (Aspirin) .... Take 1 By Mouth Qd 2)  Perdiem Overnight Relief 15 Mg  Tabs (Sennosides) .... Take 2 At Bedtime Prn 3)  Miralax   Powd (Polyethylene Glycol 3350) .... Take 17 Grams Qd 4)  Canasa 1000 Mg  Supp (Mesalamine) .... Insert One Into Rectum As Needed 5)  Amlodipine Besylate 5 Mg Tabs (Amlodipine Besylate) .Marland Kitchen.. 1 By Mouth Once Daily 6)  Benicar 20 Mg Tabs (Olmesartan Medoxomil) .Marland Kitchen.. 1po Once Daily 7)  Dexilant 60 Mg Cpdr (Dexlansoprazole) .... Qd  Allergies (verified): No Known Drug Allergies PMH-FH-SH reviewed-no changes except otherwise noted  Review of Systems  The patient denies anorexia, fever, weight loss, weight gain, chest pain, syncope, dyspnea on exertion, headaches, muscle weakness, and difficulty walking.         persistent swallowing difficulty.   Physical Exam  General:  alert, well-developed, well-nourished, and well-hydrated.   Head:  normocephalic and atraumatic.   Eyes:  vision grossly intact, pupils equal, pupils round, and corneas  and lenses clear.   Lungs:  normal respiratory effort.   Heart:  normal rate and regular rhythm.   Msk:  normal ROM.   Pulses:  2+ radial Neurologic:  alert & oriented X3, cranial nerves II-XII intact, gait normal, and DTRs symmetrical and normal.   Skin:  turgor normal and color normal.   Psych:  Oriented X3, memory intact for recent and remote, and normally interactive.     Impression & Recommendations:  Problem # 1:  HYPERTENSION (ICD-401.9) Intolerant of Benicar.  Plan - resume amlodipine but at increased dose of 10mg  once daily.           will follow-up Saturday, Sept 3rd - if not controlled will add low dose diuretic.   The following medications were removed from the medication list:    Benicar 20 Mg Tabs (Olmesartan medoxomil) .Marland Kitchen... 1po once daily Her updated medication list for this problem includes:    Amlodipine Besylate 10 Mg Tabs (Amlodipine besylate) .Marland Kitchen... 1 by mouth once daily  Complete Medication List: 1)  Bayer Aspirin 325 Mg Tabs (Aspirin) .... Take 1 by mouth qd 2)  Perdiem Overnight Relief 15 Mg Tabs (Sennosides) .... Take 2 at bedtime prn 3)  Miralax Powd (Polyethylene glycol 3350) .... Take 17 grams qd 4)  Canasa 1000 Mg Supp (  Mesalamine) .... Insert one into rectum as needed 5)  Amlodipine Besylate 10 Mg Tabs (Amlodipine besylate) .Marland Kitchen.. 1 by mouth once daily 6)  Dexilant 60 Mg Cpdr (Dexlansoprazole) .... Qd  Patient Instructions: 1)  Blood pressure management - today 148/86. It is common for the blood pressure to go up at the doctor's office and during stress. Plan - STOP benicar. Resume amlodipine at 10 mg once a day. Check you Blood pressure. I will call Saturday to see how it is going. If there is suboptimal control the next step will be to add a low dose diuretic.

## 2010-11-21 NOTE — Assessment & Plan Note (Signed)
Summary: F/U APPT...LSW.    History of Present Illness Visit Type: Follow-up Visit Primary GI MD: Sheryn Bison MD FACP FAGA Primary Adalida Garver: Illene Regulus, MD Requesting Jevaun Strick: n/a Chief Complaint: Belching, chest pain, dysphagia with soilds and liquids, cough, and voice changes History of Present Illness:   Continued burning substernal chest pain with new onset solid food dysphagia a 65 year old Caucasian female with chronic anxiety and depression, IBS, recurrent hemorrhoidal bleeding, stable hypertension.  She was recently reevaluated by Dr. Oliver Barre for blood pressure difficulties and she currently is on Benicar 20 mg a day and amlodipine 5 mg a day. She takes regular Perdiem and MiraLax for chronic constipation. Her last endoscopy was several years ago. In the past she has not responded to PPI therapy. I cannot see where she's had esophageal manometry 24-hour pH probe testing.  She's had some mild anorexia and weight loss related to the death of her husband. She has a chronic resting tachycardia of unexplained etiology with normal thyroid function tests. She does have known peripheral vascular disease and cerebral vascular disease. She is on daily aspirin tablet but not other anticoagulants. She denies any current neurovascular problems. Recent EKG and cardiac evaluation by Dr. Jonny Ruiz were unremarkable.   GI Review of Systems    Reports belching, chest pain, dysphagia with liquids, and  dysphagia with solids.      Denies abdominal pain, acid reflux, bloating, heartburn, loss of appetite, nausea, vomiting, vomiting blood, weight loss, and  weight gain.        Denies anal fissure, black tarry stools, change in bowel habit, constipation, diarrhea, diverticulosis, fecal incontinence, heme positive stool, hemorrhoids, irritable bowel syndrome, jaundice, light color stool, liver problems, rectal bleeding, and  rectal pain.    Current Medications (verified): 1)  Bayer Aspirin 325 Mg   Tabs (Aspirin) .... Take 1 By Mouth Qd 2)  Perdiem Overnight Relief 15 Mg  Tabs (Sennosides) .... Take 2 At Bedtime Prn 3)  Miralax   Powd (Polyethylene Glycol 3350) .... Take 17 Grams Qd 4)  Canasa 1000 Mg  Supp (Mesalamine) .... Insert One Into Rectum As Needed 5)  Amlodipine Besylate 5 Mg Tabs (Amlodipine Besylate) .Marland Kitchen.. 1 By Mouth Once Daily 6)  Benicar 20 Mg Tabs (Olmesartan Medoxomil) .Marland Kitchen.. 1po Once Daily  Allergies (verified): No Known Drug Allergies  Past History:  Past medical, surgical, family and social histories (including risk factors) reviewed for relevance to current acute and chronic problems.  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) DIZZINESS (ICD-780.4) TACHYARRHYTHMIA (ICD-785.0) SINUSITIS- ACUTE-NOS (ICD-461.9) MELANOSIS COLI (ICD-569.89) RECTAL FISSURE (ICD-565.0) STENOSIS, RECTAL (ICD-569.2) HELICOBACTER PYLORI GASTRITIS, HX OF (ICD-V12.79) IRRITABLE BOWEL SYNDROME (ICD-564.1) CONSTIPATION (ICD-564.00) EXTERNAL HEMORRHOIDS (ICD-455.3) ESOPHAGEAL STRICTURE (ICD-530.3) RECTAL PAIN (ICD-569.42) WEAKNESS (ICD-780.79) SYNCOPE (ICD-780.2) GRIEF REACTION, ACUTE (ICD-309.0) ROUTINE GENERAL MEDICAL EXAM@HEALTH  CARE FACL (ICD-V70.0) HYPERLIPIDEMIA (ICD-272.4) HYPERTENSION (ICD-401.9) GERD (ICD-530.81)  Past Surgical History: Reviewed history from 11/27/2007 and no changes required. 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 father - deceased @ 49; cancer-bone  primary, mother-deceased @49  MVA brother - deceased @67 ; CAD/MI, Neg-breast, colon cancer; DM  Social History: Reviewed history from 01/14/2009 and no changes required. HSG married '71- widowed '08 1 son - '76; no grandchildren retired.  Lives alone. I-ADLs End of Life: no resuscitation, no mechanical ventilation, no futile heroic measures. Patient is a former smoker. quit 40 years ago Alcohol Use - yes occ wine Daily Caffeine Use 2 cups coffee/day Illicit Drug Use - no  Review of Systems       The patient complains of cough, depression-new, shortness of breath, and voice change.  The patient denies allergy/sinus, anemia, anxiety-new, arthritis/joint pain, back pain, blood in urine, breast changes/lumps, change in vision, confusion, coughing up blood, fainting, fatigue, fever, headaches-new, hearing problems, heart murmur, heart rhythm changes, itching, menstrual pain, muscle pains/cramps, night sweats, nosebleeds, pregnancy symptoms, skin rash, sleeping problems, sore throat, swelling of feet/legs, swollen lymph glands, thirst - excessive , urination - excessive , urination changes/pain, urine leakage, and vision changes.    Vital Signs:  Patient profile:   65 year old female Height:      62.5 inches Weight:      107 pounds BMI:     19.33 BSA:     1.48 Pulse rate:   108 / minute Pulse rhythm:   regular BP sitting:   152 / 86  (left arm) Cuff size:   regular  Vitals Entered By: Ok Anis CMA (June 20, 2010 2:49 PM)  Physical Exam  General:  Well developed, well nourished, no acute distress.Very thin but well-nourished female. Head:  Normocephalic and atraumatic. Eyes:  PERRLA, no icterus. Neck:  Supple; no masses or thyromegaly. Lungs:  Clear throughout to auscultation. Heart:  Regular rate and rhythm; no murmurs, rubs,  or bruits.Resting tachycardia noted but she appeared to be in a regular rhythm. Abdomen:  Soft, nontender and nondistended. No masses,  hepatosplenomegaly or hernias noted. Normal bowel sounds.Prominence of the liver the right upper quadrant. I cannot appreciate splenomegaly, other abdominal masses or tenderness. Rectal:  deferred Msk:  Symmetrical with no gross deformities. Normal posture. Extremities:  No clubbing, cyanosis, edema or deformities noted. Neurologic:  Alert and  oriented x4;  grossly normal neurologically. Skin:  Intact without significant lesions or rashes. Cervical Nodes:  No significant cervical adenopathy. Psych:  Alert and cooperative. Normal mood and affect.   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 I have restarted Dexilant 60 mg a day along with standard antireflux maneuvers, and repeat her endoscopy, esophageal biopsies and dilation.Consider possible manometry and 24-hour pH probe testing.  Problem # 2:  PERIPHERAL VASCULAR DISEASE (ICD-443.9) Assessment: Unchanged blood pressure today 152/86 and pulse 108 and regular. She may need further anti-hypertensive therapy. She has been started on a statin medication.  Problem # 3:  HYPOTENSION, ORTHOSTATIC (ICD-458.0) Assessment: Improved  Problem # 4:  TACHYARRHYTHMIA (ICD-785.0) Assessment: Unchanged Consider Per Dr. Debby Bud further thyroid evaluation. Clinically observing this patient, she appears to be hyperthyroid.  Problem # 5:  RECTAL FISSURE (ICD-565.0) Assessment: Improved Continue treatment for constipation and local anal care as previously outlined  Patient Instructions: 1)  Begin Dexilant once daily.  Take before breakfast. 2)  You are scheduled for an upper endoscopy. 3)  The medication list was reviewed and reconciled.  All changed / newly prescribed medications were explained.  A complete medication list was provided to the patient / caregiver. 4)  Copy sent to  : Dr. Illene Regulus 5)  Avoid foods high in acid content ( tomatoes, citrus juices, spicy foods) . Avoid eating within 3 to 4 hours of lying down  or before exercising. Do not over eat; try smaller more frequent meals. Elevate head of bed four inches when sleeping.  6)  Conscious Sedation brochure given.  7)  Upper Endoscopy with Dilatation brochure given.   Appended Document: F/U APPT...LSW.    Clinical Lists Changes  Medications: Added new medication of DEXILANT 60 MG CPDR (DEXLANSOPRAZOLE) qd Orders: Added new Test order of EGD (EGD) - Signed

## 2010-11-21 NOTE — Assessment & Plan Note (Signed)
Summary: CPX/MEDICARE//LABS AFTER - PT AWARE/CD   Vital Signs:  Patient profile:   65 year old female Height:      63 inches (160.02 cm) Weight:      106.75 pounds (75.80 kg) BMI:     18.98 O2 Sat:      98 % on Room air Temp:     98.6 degrees F (37.00 degrees C) oral Pulse rate:   83 / minute Pulse rhythm:   regular BP sitting:   142 / 100  (left arm) Cuff size:   regular  Vitals Entered By: Brenton Grills (January 31, 2010 2:27 PM)  O2 Flow:  Room air CC: pt here for CPX/pt states she hasn't been taking bystolic/pt is due for tetanus/aj   Primary Care Provider:  Illene Regulus, MD  CC:  pt here for CPX/pt states she hasn't been taking bystolic/pt is due for tetanus/aj.  History of Present Illness: Patient presents for routine medical follow-up.  She reports a 3 month h/o dizziness and balance chronically: she has been bumping into objects and falls. She will occasionally have room spinning., especially when she first sits up in the morning. She will have position veritigo at other itmes as well. She feels very fatigued. No focal weakness. She has had intermittent paresthesia of the left arm. Blood pressues at home have been running in the [phone] range.  She reports she is sleeping better and feeling better other than the fatigue. She is adapting to being alone.   Has had mammogram, DXA and PAP with Dr. Patterson Hammersmith.   Current Medications (verified): 1)  Bayer Aspirin 325 Mg  Tabs (Aspirin) .... Take 1 By Mouth Qd 2)  Perdiem Overnight Relief 15 Mg  Tabs (Sennosides) .... Take 2 At Bedtime Prn 3)  Miralax   Powd (Polyethylene Glycol 3350) .... Take 17 Grams Qd 4)  Canasa 1000 Mg  Supp (Mesalamine) .... Insert One Into Rectum As Needed 5)  Bystolic 5 Mg Tabs (Nebivolol Hcl) .Marland Kitchen.. 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 Lives alone. I-ADLs End of Life: no resuscitation, no mechanical ventilation, no futile heroic measures. Patient is a former smoker. quit 40 years ago Alcohol Use - yes occ wine Daily Caffeine Use 2 cups coffee/day Illicit Drug Use - no  Risk Factors: Alcohol Use: 0 (12/21/07) Caffeine Use: 4 (12/21/07) Exercise: yes (December 21, 2007)  Risk Factors: Smoking Status: quit (01/14/2009)  Review of Systems  The patient denies anorexia, fever, weight loss, weight gain, vision loss, chest pain, syncope, dyspnea on exertion, headaches, abdominal pain, severe indigestion/heartburn, incontinence, muscle weakness, difficulty walking, unusual weight change, enlarged lymph nodes, angioedema, and breast masses.    Physical  Exam  General:  alert, well-developed, well-nourished, and well-hydrated slender white female.   Head:  Normocephalic and atraumatic without obvious abnormalities. No apparent alopecia or  balding. Eyes:  No corneal or conjunctival inflammation noted. EOMI. Perrla. Funduscopic exam benign, without hemorrhages, exudates or papilledema. Vision grossly normal. Ears:  External ear exam shows no significant lesions or deformities.  Otoscopic examination reveals clear canals, tympanic membranes are intact bilaterally without bulging, retraction, inflammation or discharge. Hearing is grossly normal bilaterally. Nose:  no external deformity and no external erythema.   Mouth:  Oral mucosa and oropharynx without lesions or exudates.  Teeth in good repair. Neck:  supple, full ROM, no thyromegaly, and no carotid bruits.   Chest Wall:  no deformities.   Breasts:  No mass, nodules, thickening, tenderness, bulging, retraction, inflamation, nipple discharge or skin changes noted.   Lungs:  Normal respiratory effort, chest expands symmetrically. Lungs are clear to auscultation, no crackles or wheezes. Heart:  Normal rate and regular rhythm. S1 and S2 normal without gallop, murmur, click, rub or other extra sounds. Abdomen:  soft, non-tender, normal bowel sounds, no distention, no masses, no guarding, no rigidity, and no hepatomegaly.   Msk:  normal ROM, no joint tenderness, no joint swelling, no joint warmth, no redness over joints, and no joint instability.   Pulses:  2+ radial and DP pulses Extremities:  No clubbing, cyanosis, edema, or deformity noted with normal full range of motion of all joints.   Neurologic:  alert & oriented X3, cranial nerves II-XII intact, strength normal in all extremities, and sensation intact to light touch, deep vibratory sensaton, nl gait, nl DTRs throughout, nl finger to nose, nl heel to shin, no dysdiadochokinesia, nl rapid alternating finger movement, negative rhomberg, no pronator drift.   Skin:  turgor normal, color normal, no rashes, no suspicious lesions, and no edema.   Cervical Nodes:  no anterior cervical adenopathy and no posterior cervical adenopathy.    Axillary Nodes:  no R axillary adenopathy and no L axillary adenopathy.   Psych:  Oriented X3, memory intact for recent and remote, normally interactive, good eye contact, and not anxious appearing.     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  Plan - U/S to r/o AAA  Orders: Radiology Referral (Radiology) Radiology Referral (Radiology)  Problem # 2:  DIZZINESS (ICD-780.4) Non-focal neuro exam except for minor decrease in deep vibratory sensation. she does have dizziness with position change. 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  Problem # 5:  HYPERTENSION (ICD-401.9)  Her updated medication list for this problem includes:    Bystolic 5 Mg Tabs (Nebivolol hcl) .Marland Kitchen... 1 by mouth once daily  Orders: TLB-BMP (Basic Metabolic Panel-BMET) (80048-METABOL) Prescription Created Electronically [phone])  BP today: 142/100 Prior BP: 162/100 (09/02/2009)  elevated BP. Pt never provided Rx for bystolic after using sample  Plan - bystolic 5mg  once daily          f/u BP monitoring  Labs Reviewed: K+: 4.7 (01/27/2009) Creat: : 0.7 (01/27/2009)   Chol: 210 (01/27/2009)   HDL: 56.10 (01/27/2009)   LDL: DEL (11/19/2007)   TG: 66.0 (01/27/2009)  Problem # 6:  Preventive Health Care (ICD-V70.0) Current with  mammography and colonoscopy. Current immunizations for tetnus, pneumo, flu. She is a candidate for zostavax. General labs are within normal limits.  In summary - a very nice woman who appears to be medically stable with moderate positional vertigo. She will return as needed or 1 year for exam. She should return for BP check in one month or call in report of readings.   Complete Medication List: 1)  Bayer Aspirin 325 Mg Tabs (Aspirin) .... Take 1 by mouth qd 2)  Perdiem Overnight Relief 15 Mg Tabs (Sennosides) .... Take 2 at bedtime prn 3)  Miralax Powd (Polyethylene glycol 3350) .... Take 17 grams qd 4)  Canasa 1000 Mg Supp (Mesalamine) .... Insert one into rectum as needed 5)  Bystolic 5 Mg Tabs (Nebivolol hcl) .Marland Kitchen.. 1 by mouth once daily  Other Orders: TLB-CBC Platelet - w/Differential (85025-CBCD) TD Toxoids IM 7 YR + (01027) Admin 1st Vaccine (25366)  Patient: Crystal [name] Note: All result statuses are Final unless otherwise noted.  Tests: (1) B12 + Folate Panel (B12/FOL)   Vitamin B12               370 pg/mL                   211-911   Folate                    12.7 ng/mL     Deficient  0.4 - 3.4 ng/mL     Indeterminate  3.4 - 5.4 ng/mL     Normal  >5.4 ng/mL  Tests: (2) TSH (TSH)   FastTSH                   1.90 uIU/mL                 0.35-5.50  Tests: (3) CBC Platelet w/Diff (CBCD)   White Cell Count          7.9 K/uL                    4.5-10.5   Red Cell Count            3.98 Mil/uL                 3.87-5.11   Hemoglobin                12.9 g/dL                   44.0-34.7   Hematocrit                37.3 %                      36.0-46.0   MCV                       93.6 fl                     78.0-100.0   MCHC  34.6 g/dL                   60.4-54.0   RDW                       12.3 %                      11.5-14.6   Platelet Count            246.0 K/uL                  150.0-400.0   Neutrophil %              75.1 %                      43.0-77.0    Lymphocyte %              17.5 %                      12.0-46.0   Monocyte %                4.7 %                       3.0-12.0   Eosinophils%              2.0 %                       0.0-5.0   Basophils %               0.7 %                       0.0-3.0   Neutrophill Absolute      5.9 K/uL                    1.4-7.7   Lymphocyte Absolute       1.4 K/uL                    0.7-4.0   Monocyte Absolute         0.4 K/uL                    0.1-1.0  Eosinophils, Absolute                             0.2 K/uL                    0.0-0.7   Basophils Absolute        0.1 K/uL                    0.0-0.1  Tests: (4) Lipid Panel (LIPID)   Cholesterol          [H]  229 mg/dL                   9-811     ATP III Classification            Desirable:  < 200 mg/dL                    Borderline High:  200 - 239 mg/dL  High:  > = 240 mg/dL   Triglycerides             103.0 mg/dL                 1.6-109.6     Normal:  <150 mg/dL     Borderline High:  045 - 199 mg/dL   HDL                       40.98 mg/dL                 >11.91   VLDL Cholesterol          20.6 mg/dL                  4.7-82.9  CHO/HDL Ratio:  CHD Risk                             3                    Men          Women     1/2 Average Risk     3.4          3.3     Average Risk          5.0          4.4     2X Average Risk          9.6          7.1     3X Average Risk          15.0          11.0                           Tests: (5) Hepatic/Liver Function Panel (HEPATIC)   Total Bilirubin           0.9 mg/dL                   5.6-2.1   Direct Bilirubin          0.1 mg/dL                   3.0-8.6   Alkaline Phosphatase      85 U/L                      39-117   AST                       24 U/L                      0-37   ALT                       26 U/L                      0-35   Total Protein             7.3 g/dL                    5.7-8.4   Albumin                   4.5 g/dL  3.5-5.2  Tests: (6) BMP  (METABOL)   Sodium                    145 mEq/L                   135-145   Potassium                 4.2 mEq/L                   3.5-5.1   Chloride                  103 mEq/L                   96-112   Carbon Dioxide       [H]  34 mEq/L                    19-32   Glucose                   98 mg/dL                    13-08   BUN                       15 mg/dL                    6-57   Creatinine                0.7 mg/dL                   8.4-6.9   Calcium                   10.2 mg/dL                  6.2-95.2   GFR                       88.92 mL/min                >60  Tests: (7) Cholesterol LDL - Direct (DIRLDL)  Cholesterol LDL - Direct                             144.7 mg/dL     Optimal:  <841 mg/dL     Near or Above Optimal:  100-129 mg/dL     Borderline High:  324-401 mg/dL     High:  027-253 mg/dL     Very High:  >664 mg/dLPrescriptions: BYSTOLIC 5 MG TABS (NEBIVOLOL HCL) 1 by mouth once daily  #30 x 5   Entered and Authorized by:   Jacques Navy MD   Signed by:   Jacques Navy MD on 01/31/2010   Method used:   Electronically to        [facility] [phone]* (retail)       [address] [place], [place]  [address]       Ph: [phone] or [phone]       Fax: [phone]   RxID:   [id number]    Preventive Care Screening  Pap Smear:    Date:  06/22/2009    Results:  normal  Immunizations Administered:  Tetanus Vaccine:    Vaccine Type: Td    Site: left deltoid    Mfr: Sanofi Pasteur    Dose: 0.5 ml    Route: IM    Given by: Ami Bullins CMA    Exp. Date: 11/04/2011    Lot #: [id number]    VIS given: 09/09/07 version given January 31, 2010.

## 2010-11-21 NOTE — Assessment & Plan Note (Signed)
Summary: follow up egd path/eosinophilic esophagitis/lk    History of Present Illness Primary GI MD: Sheryn Bison MD FACP FAGA Primary Provider: Illene Regulus, MD Requesting Provider: n/a Chief Complaint: F/u after EGD, Pt still having solid food and liquid dysphagia with chest tightness, and choking on saliva.  History of Present Illness:   65 year old Caucasian female who has had intractable noncardiac chest pain with multiple negative GI and cardiac evaluations. She recently has had chest pain and dysphagia and I repeat her endoscopic exam which appeared normal. However, mucosa biopsy of the esophagus have suggested eosinophilic esophagitis. She does not have a complicated or long history of drug or food allergies. She had been on PPI therapy for many years without any improvement in her noncardiac chest pain. She also has chronic constipation and a mild proctitis which has responded to Perdiem, MiraLax, and Canasa suppositories.   GI Review of Systems    Reports chest pain, dysphagia with liquids, and  dysphagia with solids.      Denies abdominal pain, acid reflux, belching, bloating, heartburn, loss of appetite, nausea, vomiting, vomiting blood, weight loss, and  weight gain.        Denies anal fissure, black tarry stools, change in bowel habit, constipation, diarrhea, diverticulosis, fecal incontinence, heme positive stool, hemorrhoids, irritable bowel syndrome, jaundice, light color stool, liver problems, rectal bleeding, and  rectal pain.    Current Medications (verified): 1)  Bayer Aspirin 325 Mg  Tabs (Aspirin) .... Take 1 By Mouth Qd 2)  Perdiem Overnight Relief 15 Mg  Tabs (Sennosides) .... Take 2 At Bedtime Prn 3)  Miralax   Powd (Polyethylene Glycol 3350) .... Take 17 Grams Qd 4)  Canasa 1000 Mg  Supp (Mesalamine) .... Insert One Into Rectum As Needed 5)  Amlodipine Besylate 10 Mg Tabs (Amlodipine Besylate) .Marland Kitchen.. 1 By Mouth Once Daily  Allergies (verified): No Known Drug  Allergies  Past History:  Past medical, surgical, family and social histories (including risk factors) reviewed for relevance to current acute and chronic problems.  Past Medical History: Reviewed history from 07/13/2010 and no changes required. 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 father - deceased @ 46; cancer-bone primary, mother-deceased @49  MVA brother - deceased @67 ; CAD/MI, Neg-breast, colon cancer; DM  Social History: Reviewed history from 01/14/2009 and no changes required. HSG married '71- widowed '08 1 son - '76; no grandchildren retired. Lives alone. I-ADLs End of Life: no resuscitation, no mechanical ventilation, no futile heroic measures. Patient is a former smoker.  quit 40 years ago Alcohol Use - yes occ wine Daily Caffeine Use 2 cups coffee/day Illicit Drug Use - no  Review of Systems  The patient denies allergy/sinus, anemia, anxiety-new, arthritis/joint pain, back pain, blood in urine, breast changes/lumps, change  in vision, confusion, cough, coughing up blood, depression-new, fainting, fatigue, fever, headaches-new, hearing problems, heart murmur, heart rhythm changes, itching, menstrual pain, muscle pains/cramps, night sweats, nosebleeds, pregnancy symptoms, shortness of breath, skin rash, sleeping problems, sore throat, swelling of feet/legs, swollen lymph glands, thirst - excessive , urination - excessive , urination changes/pain, urine leakage, vision changes, and voice change.    Vital Signs:  Patient profile:   65 year old female Height:      62.5 inches Weight:      107.25 pounds BMI:     19.37 Pulse rate:   88 / minute Pulse rhythm:   regular BP sitting:   124 / 68  (left arm) Cuff size:   regular  Vitals Entered By: Christie Nottingham CMA [name] Dull) (July 14, 2010 11:22 AM)  Physical Exam  General:  Well developed, well nourished, no acute distress.healthy appearing.   Head:  Normocephalic and atraumatic. Eyes:  vision grossly intact, pupils equal, pupils round, and corneas and lenses clear.   Psych:  Alert and cooperative. Normal mood and affect.   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 We will try this for one month office followup at that time. Hopefully, this will make a difference in her symptomatology. I see no need for PPI therapy at this time.  Problem # 2:  HYPOTENSION, ORTHOSTATIC (ICD-458.0) Assessment: Improved blood pressure today is 124/66 and pulse is 88 and regular. She currently is on Amiodipine 10 mg once a day for hypertension.  Patient Instructions: 1)  Copy sent to  : Illene Regulus, MD 2)  We have sent the Flovent to your pharmacy. Do NOT use a Spacer. 3)  Handout on eosinophilic esophagitis given. 4)  The medication list was reviewed and reconciled.  All changed / newly prescribed medications were explained.  A complete medication list was provided to the patient / caregiver. 5)  Please schedule a follow-up appointment in 1 month.  Prescriptions: FLOVENT HFA 110 MCG/ACT AERO (FLUTICASONE PROPIONATE  HFA) Swallow 4 puffs three times a day for 6 weeks---NO SPACER  #1 x 2   Entered by:   Harlow Mares CMA (AAMA)   Authorized by:   Mardella Layman MD [facility]   Signed by:   Harlow Mares CMA (AAMA) on 07/14/2010   Method used:   Electronically to        [facility] [phone]* (retail)       [address] [place], [place]  [address]       Ph: [phone] or [phone]       Fax: [phone]   RxID:   [id number]

## 2010-11-21 NOTE — Assessment & Plan Note (Signed)
Summary: BP IS HIGH Ann Held /PER DR PATTERSON/NWS  MEN'S PT   Vital Signs:  Patient profile:   65 year old female Height:      62.5 inches Weight:      106.75 pounds BMI:     19.28 O2 Sat:      98 % on Room air Temp:     98.1 degrees F oral Pulse rate:   91 / minute BP sitting:   162 / 92  (left arm) Cuff size:   regular  Vitals Entered By: Zella Ball Ewing CMA [name] Dull) (June 16, 2010 11:48 AM)  O2 Flow:  Room air CC: BP Elevated, dizzy/RE Comments Orth. vitals: Supine:  BP 180/100  Pulse 89  O2 sat 97  Sitting:  BP 180/110  Pulse 89  O2 sat  98 Standing:  BP 160/100  Pulse 98  O2 sat [address] CMA [name] Dull)  June 16, 2010 12:18 PM    Primary Care Lisabeth Mian:  Illene Regulus, MD  CC:  BP Elevated and dizzy/RE.  History of Present Illness: here referred per GI seen briefly this AM noting pt c/o CP, sob and elevated HR when presenting for evaluation for difficulty swallowing.  Upon my arrival here, pt has extremely tense demeanor, and mild angry at the turn of events , somewhat directing this towards me, although she did soften and smile twice during the exam with gentle explanations.  To me she denies significant CP or SOB, and HR is improved on arrival here, but instead c/o significant dizziness, lightheadedness, general weakness with recent recurring nausea with difficulty swallowing, which is what I think she related as "pain" at her GI visit.  Denies fever, headache, ST, cough, and Pt denies CP, worsening sob, doe, wheezing, orthopnea, pnd, worsening LE edema, palps, or syncope . Pt denies new neuro symptoms such as headache, facial or extremity weakness . Did have significant loose stool yesterday as well yesterday, which she thinks is similar to her IBS.  No worsening abd pain, vomiting, wt loss or blood.  Noted April 2011 U/S abd and labs essentially negative/normal.  No fever, wt loss, but I did not ask about other constitutional symtpoms or worsening depression/anxiety or  panic.  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) 9)  Rectal Fissure  (ICD-565.0) 10)  Stenosis, Rectal  (ICD-569.2) 11)  Helicobacter Pylori Gastritis, Hx of  (ICD-V12.79) 12)  Irritable Bowel Syndrome  (ICD-564.1) 13)  Constipation  (ICD-564.00) 14)  External Hemorrhoids  (ICD-455.3) 15)  Esophageal Stricture  (ICD-530.3) 16)  Rectal Pain  (ICD-569.42) 17)  Weakness  (ICD-780.79) 18)  Syncope  (ICD-780.2) 19)  Grief Reaction, Acute  (ICD-309.0) 20)  Routine General Medical Exam@health  Care [facility]  (ICD-V70.0) 21)  Hyperlipidemia  (ICD-272.4) 22)  Hypertension  (ICD-401.9) 23)  Gerd  (ICD-530.81)  Medications Prior to Update: 1)  Bayer Aspirin 325 Mg  Tabs (Aspirin) .... Take 1 By Mouth Qd 2)  Perdiem Overnight Relief 15 Mg  Tabs (Sennosides) .... Take 2 At Bedtime Prn 3)  Miralax   Powd (Polyethylene Glycol 3350) .... Take 17 Grams Qd 4)  Canasa 1000 Mg  Supp (Mesalamine) .... Insert One Into Rectum As Needed 5)  Amlodipine Besylate 5 Mg Tabs (Amlodipine Besylate) .Marland KitchenMarland KitchenMarland Kitchen  1 By Mouth Once Daily  Current Medications (verified): 1)  Bayer Aspirin 325 Mg  Tabs (Aspirin) .... Take 1 By Mouth Qd 2)  Perdiem Overnight Relief 15 Mg  Tabs (Sennosides) .... Take 2 At Bedtime Prn 3)  Miralax   Powd (Polyethylene Glycol 3350) .... Take 17 Grams Qd 4)  Canasa 1000 Mg  Supp (Mesalamine) .... Insert One Into Rectum As Needed 5)  Amlodipine Besylate 5 Mg Tabs (Amlodipine Besylate) .Marland Kitchen.. 1 By Mouth Once Daily 6)  Benicar 20 Mg Tabs (Olmesartan Medoxomil) .Marland Kitchen.. 1po Once Daily  Allergies (verified): No Known Drug Allergies  Past History:  Family History: Last updated: 2007-12-23 father - deceased @ 24; cancer-bone  primary, mother-deceased @49  MVA brother - deceased @67 ; CAD/MI, Neg-breast, colon cancer; DM  Social History: Last updated: 01/14/2009 HSG married '71- widowed '08 1 son - '76; no grandchildren retired. Lives alone. I-ADLs End of Life: no resuscitation, no mechanical ventilation, no futile heroic measures. Patient is a former smoker. quit 40 years ago Alcohol Use - yes occ wine Daily Caffeine Use 2 cups coffee/day Illicit Drug Use - no  Risk Factors: Alcohol Use: 0 (12/23/2007) Caffeine Use: 4 (12-23-07) Exercise: yes (12-23-2007)  Risk Factors: Smoking Status: quit (01/14/2009)  Past Medical History: Current Problems:  MELANOSIS COLI (ICD-569.89) RECTAL FISSURE (ICD-565.0) STENOSIS, RECTAL (ICD-569.2) HELICOBACTER PYLORI GASTRITIS, HX OF (ICD-V12.79) IRRITABLE BOWEL SYNDROME (ICD-564.1) CONSTIPATION (ICD-564.00) EXTERNAL HEMORRHOIDS (ICD-455.3) ESOPHAGEAL STRICTURE (ICD-530.3) RECTAL PAIN (ICD-569.42) WEAKNESS (ICD-780.79) SYNCOPE (ICD-780.2) GRIEF REACTION, ACUTE (ICD-309.0) ROUTINE GENERAL MEDICAL EXAM@HEALTH  CARE FACL (ICD-V70.0) HYPERLIPIDEMIA (ICD-272.4) HYPERTENSION (ICD-401.9) GERD (ICD-530.81) Peripheral vascular disease - 50% left subclavian, and mild bilat carotid bulb Cerebrovascular disease  Past Surgical History: Reviewed history from 12/23/2007 and no changes required. 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         all otherwise negative per pt -    Physical  Exam  General:  alert and well-developed.   Head:  normocephalic and atraumatic.   Eyes:  vision grossly intact, pupils equal, and pupils round.   Ears:  R ear normal and L ear normal.   Nose:  no external deformity and no nasal discharge.   Mouth:  no gingival abnormalities and fair dentition.   Neck:  supple and no masses.   Lungs:  normal respiratory effort and normal breath sounds.   Heart:  normal rate and regular rhythm.   Abdomen:  soft and normal bowel sounds.  with very mild low mid abd and llq tender Msk:  no joint tenderness and no joint swelling.   Extremities:  no edema, no erythema  Neurologic:  cranial nerves II-XII grossly intact, strength normal in all extremities, sensation intact to light touch, gait normal, and DTRs symmetrical and normal.   Skin:  no rashes.   Psych:  as above   Impression & Recommendations:  Problem # 1:  HYPOTENSION, ORTHOSTATIC (ICD-458.0)  has relative hypotension with postural change today that likely accounts for the dizziness and weakness, in the setting of essential HTN and recent dysphagia, nausea , and loose stool , each of unclear etiology;  currently afeb and benign exam except for extremely tense demeanor, orthostasis, and mild lower  left/mid abd mild tenderness; ;  ecg reviewed -  NSR/PAC's - o/w no signif change; unable to do UA today due to just urinated ("you didnt tell me you needed the specimen") but will check cbc, bmet and encouraged increased by mouth fluids in freq small amounts, and extra table salt in the next few days as well; does not appear to need IVF's at this time as she can take po  Orders: EKG w/ Interpretation (93000) TLB-BMP (Basic Metabolic Panel-BMET) (80048-METABOL) TLB-CBC Platelet - w/Differential (85025-CBCD)  Problem # 2:  HYPERTENSION (ICD-401.9)  Her updated medication list for this problem includes:    Amlodipine Besylate 5 Mg Tabs (Amlodipine besylate) .Marland Kitchen... 1 by mouth once daily    Benicar 20 Mg  Tabs (Olmesartan medoxomil) .Marland Kitchen... 1po once daily to add the benicar 20 mg for overall uncontrolled essential HTN after chart review in the setting of known cerebrovascular disease  BP today: 162/92 Prior BP: 170/100 (06/16/2010)  Labs Reviewed: K+: 4.2 (01/31/2010) Creat: : 0.7 (01/31/2010)   Chol: 229 (01/31/2010)   HDL: 69.40 (01/31/2010)   LDL: DEL (11/19/2007)   TG: 103.0 (01/31/2010)  Problem # 3:  GERD (ICD-530.81) as above, for PPI  Problem # 4:  OTHER DYSPHAGIA (ICD-787.29) will need further consideration per GI - encourage pt to f/u with GI as above  Complete Medication List: 1)  Bayer Aspirin 325 Mg Tabs (Aspirin) .... Take 1 by mouth qd 2)  Perdiem Overnight Relief 15 Mg Tabs (Sennosides) .... Take 2 at bedtime prn 3)  Miralax Powd (Polyethylene glycol 3350) .... Take 17 grams qd 4)  Canasa 1000 Mg Supp (Mesalamine) .... Insert one into rectum as needed 5)  Amlodipine Besylate 5 Mg Tabs (Amlodipine besylate) .Marland Kitchen.. 1 by mouth once daily 6)  Benicar 20 Mg Tabs (Olmesartan medoxomil) .Marland Kitchen.. 1po once daily  Patient Instructions: 1)  please for now take the aciphex samples at 20 mg twice per day (you can cut back to once per day if worsening diarrhea) 2)  Please drink small amounts of fluid as much as you can in the next few days 3)  You can also take some extra table salt in your diet in the next few days as well 4)  Please take all new medications as prescribed  - the benicar 20 mg per day 5)  Continue all previous medications as before this visit 6)  Please schedule an appointment with your primary doctor in : 3-5 days 7)  Please go to the Lab in the basement for your blood and/or urine tests today 8)  Please call the number on the [facility] Card for results of your testing  9)  Please re-schedule your appt with Dr Jarold Motto for next available Prescriptions: BENICAR 20 MG TABS (OLMESARTAN MEDOXOMIL) 1po once daily  #30 x 11   Entered and Authorized by:   Corwin Levins MD   Signed  by:   Corwin Levins MD on 06/16/2010   Method used:   Print then Give to Patient   RxID:   [id number]

## 2010-11-21 NOTE — Assessment & Plan Note (Signed)
Summary: SWOLLEN ANKLES AND FEET -REFUSED 415P APPT TODAY -REFUSED ANO...   Vital Signs:  Patient profile:   65 year old female Height:      62.5 inches Weight:      106 pounds BMI:     19.15 O2 Sat:      98 % on Room air Temp:     98.0 degrees F oral Pulse rate:   93 / minute BP sitting:   126 / 68  (left arm) Cuff size:   regular  Vitals Entered By: Bill Salinas CMA (July 18, 2010 9:21 AM)  O2 Flow:  Room air CC: Pt c/o swelling in feet and ankles with aching in left leg from knee down, Pt states symptoms started about 1 week after increasing her amlodipine from 5mg  to 10mg / ab Comments Pt will get flu shot today/ ab   Primary Care Provider:  Illene Regulus, MD  CC:  Pt c/o swelling in feet and ankles with aching in left leg from knee down and Pt states symptoms started about 1 week after increasing her amlodipine from 5mg  to 10mg / ab.  History of Present Illness: patinet presents for the on-set of peripheral edema that leads to pain in the legs. She has recently increased her amlodipine to 10mg  with good results and this can be a contributing factor. Her chart and labs are reviewed: normal renal function in August and no history of CHF or cardiac disease.  Current Medications (verified): 1)  Bayer Aspirin 325 Mg  Tabs (Aspirin) .... Take 1 By Mouth Qd 2)  Perdiem Overnight Relief 15 Mg  Tabs (Sennosides) .... Take 2 At Bedtime Prn 3)  Miralax   Powd (Polyethylene Glycol 3350) .... Take 17 Grams Qd 4)  Canasa 1000 Mg  Supp (Mesalamine) .... Insert One Into Rectum As Needed 5)  Amlodipine Besylate 10 Mg Tabs (Amlodipine Besylate) .Marland Kitchen.. 1 By Mouth Once Daily 6)  Flovent Hfa 110 Mcg/act Aero (Fluticasone Propionate  Hfa) .... Swallow 4 Puffs Three Times A Day For 6 Weeks---No Spacer  Allergies (verified): No Known Drug Allergies  Past History:  Past Medical History: Last updated: 07/13/2010 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) OTHER DYSPHAGIA  (ICD-787.29) PERIPHERAL VASCULAR DISEASE (ICD-443.9) CEREBROVASCULAR DISEASE (ICD-437.9) HYPOTENSION, ORTHOSTATIC (ICD-458.0) DETRUSOR, OVERACTIVE (ICD-596.51) TRANSIENT ISCHEMIC ATTACK (ICD-435.9) ABDOMINAL AORTIC ECTASIA (ICD-447.72) DIZZINESS (ICD-780.4) TACHYARRHYTHMIA (ICD-785.0) SINUSITIS- ACUTE-NOS (ICD-461.9) MELANOSIS COLI (ICD-569.89) RECTAL FISSURE (ICD-565.0) STENOSIS, RECTAL (ICD-569.2) HELICOBACTER PYLORI GASTRITIS, HX OF (ICD-V12.79) IRRITABLE BOWEL SYNDROME (ICD-564.1) CONSTIPATION (ICD-564.00) EXTERNAL HEMORRHOIDS (ICD-455.3) ESOPHAGEAL STRICTURE (ICD-530.3) RECTAL PAIN (ICD-569.42) WEAKNESS (ICD-780.79) SYNCOPE (ICD-780.2) GRIEF REACTION, ACUTE (ICD-309.0) ROUTINE GENERAL MEDICAL EXAM@HEALTH  CARE FACL (ICD-V70.0) HYPERLIPIDEMIA (ICD-272.4) HYPERTENSION (ICD-401.9) GERD (ICD-530.81)  Past Surgical History: Last updated: 11/27/2007 Cath (11/20004)-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    Physical Exam  General:  Well-developed,well-nourished,in no acute distress; alert,appropriate and cooperative throughout examination Head:  normocephalic and atraumatic.   Eyes:  C&S clear Neck:  supple.   Lungs:  normal respiratory effort and normal breath sounds.   Heart:  normal rate and regular rhythm.   Extremities:  no edema noted this AM Neurologic:  alert & oriented X3, cranial nerves II-XII intact, and gait normal.   Skin:  turgor normal, color normal, and no suspicious lesions.   Psych:  Oriented X3, memory intact for recent and remote, normally interactive, and good  eye contact.     Impression & Recommendations:  Problem # 1:  VENOUS INSUFFICIENCY, LEGS (ICD-459.81) Reviewed lab and pt with normal exam. Provided full explanation of venous insufficiency (with cartoon).  Plan - no change in medication          elevate legs as needed; use knee high otc support hosiery  Problem # 2:  HYPERTENSION (ICD-401.9)  Her updated medication list for this problem includes:    Amlodipine Besylate 10 Mg Tabs (Amlodipine besylate) .Marland Kitchen... 1 by mouth once daily  BP today: 126/68 Prior BP: 124/68 (07/14/2010)  Labs Reviewed: K+: 4.5 (06/16/2010) Creat: : 0.6 (06/16/2010)   Excellent control!!!!  Complete Medication List: 1)  Bayer Aspirin 325 Mg Tabs (Aspirin) .... Take 1 by mouth qd 2)  Perdiem Overnight Relief 15 Mg Tabs (Sennosides) .... Take 2 at bedtime prn 3)  Miralax Powd (Polyethylene glycol 3350) .... Take 17 grams qd 4)  Canasa 1000 Mg Supp (Mesalamine) .... Insert one into rectum as needed 5)  Amlodipine Besylate 10 Mg Tabs (Amlodipine besylate) .Marland Kitchen.. 1 by mouth once daily 6)  Flovent Hfa 110 Mcg/act Aero (Fluticasone propionate  hfa) .... Swallow 4 puffs three times a day for 6 weeks---no spacer  Appended Document: SWOLLEN ANKLES AND FEET -REFUSED 415P APPT TODAY -REFUSED ANO...     Clinical Lists Changes  Orders: Added new Service order of Flu Vaccine 30yrs + MEDICARE PATIENTS (J1914) - Signed Added new Service order of Administration Flu vaccine - MCR (N8295) - Signed Observations: Added new observation of FLU VAX VIS: 05/16/2010 version (07/18/2010 11:41) Added new observation of FLU VAXLOT: [id number] (07/18/2010 11:41) Added new observation of FLU VAXMFR: [employer] (07/18/2010 11:41) Added new observation of FLU VAX EXP: 04/21/2011 (07/18/2010 11:41) Added new observation of FLU VAX DSE: 0.93ml (07/18/2010 11:41) Added new observation of FLU VAX: Fluvax 3+ (07/18/2010 11:41)      Flu Vaccine Consent Questions     Do you have  a history of severe allergic reactions to this vaccine? no    Any prior history of allergic reactions to egg and/or gelatin? no    Do you have a sensitivity to the preservative Thimersol? no    Do you have a past history of Guillan-Barre Syndrome? no    Do you currently have an acute febrile illness? no    Have you ever had a severe reaction to latex? no    Vaccine information given and explained to patient? yes    Are you currently pregnant? no    Lot Number:[id number]   Exp Date:04/21/2011   Site Given  Right Deltoid IMmedflu

## 2010-11-21 NOTE — Progress Notes (Signed)
  Phone Note From Other Clinic   Summary of Call: [facility] needs corrected orders, OK PER DR Yetta Barre Initial call taken by: Lamar Sprinkles, CMA,  Mar 08, 2010 4:59 PM

## 2010-11-21 NOTE — Progress Notes (Signed)
  Phone Note Outgoing Call   Reason for Call: Discuss lab or test results Summary of Call: please call patinet: U/S abdomen normal with normal aorta  Thanks Initial call taken by: Jacques Navy MD,  February 05, 2010 4:45 PM     Appended Document:  Pt informed

## 2010-11-21 NOTE — Progress Notes (Signed)
    Preventive Care Screening  Mammogram:    Date:  07/04/2010    Results:  normal

## 2010-11-21 NOTE — Assessment & Plan Note (Signed)
Summary: 1 MO ROV /NWS  #   Vital Signs:  Patient profile:   65 year old female Height:      63 inches Weight:      107 pounds BMI:     19.02 O2 Sat:      97 % on Room air Temp:     98.0 degrees F oral Pulse rate:   73 / minute BP sitting:   138 / 72  (left arm) Cuff size:   regular  Vitals Entered By: Bill Salinas CMA (Mar 02, 2010 10:58 AM)  O2 Flow:  Room air CC: pt here for one month follow up/ ab   Primary Care Provider:  Illene Regulus, MD  CC:  pt here for one month follow up/ ab.  History of Present Illness: Pt is here for follow-up on her blood pressure.  She has been taking the Bystolic since her last visit and has gotten home blood pressure readings of 114-142/63-75.  However, she reports decreased energy, fatigue, and tiredness since starting the medication a month ago.  She also believes that her dizziness/lightheadedness has worsened since taking the medication.    Patient also reports some concerning episodes of slurred speech.  She says this has happened three times over the past month.  The slurred speech will last for about half a day and improve on its own.  One of these episodes occured while she was with friends.  They reported that she was "losing time", or having periods of inattention.  She also reports some problems with numbness and tingling in her left hand.  This problem is not associated with the episodes of slurred speech.  The numbness and tingling is transient.  She denies muscle weakness and difficulty walking.  She has also recently had problems with urinary frequency and urgency.  She reports nocturia 3x/night.  She denies burning with urination.  These problems have worsened over the last month.    Current Medications (verified): 1)  Bayer Aspirin 325 Mg  Tabs (Aspirin) .... Take 1 By Mouth Qd 2)  Perdiem Overnight Relief 15 Mg  Tabs (Sennosides) .... Take 2 At Bedtime Prn 3)  Miralax   Powd (Polyethylene Glycol 3350) .... Take 17 Grams Qd 4)   Canasa 1000 Mg  Supp (Mesalamine) .... Insert One Into Rectum As Needed 5)  Bystolic 5 Mg Tabs (Nebivolol Hcl) .Marland Kitchen.. 1 By Mouth Once Daily  Allergies (verified): No Known Drug Allergies  Past History:  Past Medical History: Last updated: 02/16/2009 Current Problems:  MELANOSIS COLI (ICD-569.89) RECTAL FISSURE (ICD-565.0) STENOSIS, RECTAL (ICD-569.2) HELICOBACTER PYLORI GASTRITIS, HX OF (ICD-V12.79) IRRITABLE BOWEL SYNDROME (ICD-564.1) CONSTIPATION (ICD-564.00) EXTERNAL HEMORRHOIDS (ICD-455.3) ESOPHAGEAL STRICTURE (ICD-530.3) RECTAL PAIN (ICD-569.42) WEAKNESS (ICD-780.79) SYNCOPE (ICD-780.2) GRIEF REACTION, ACUTE (ICD-309.0) ROUTINE GENERAL MEDICAL EXAM@HEALTH  CARE FACL (ICD-V70.0) HYPERLIPIDEMIA (ICD-272.4) HYPERTENSION (ICD-401.9) GERD (ICD-530.81)  Past Surgical History: Last updated: Dec 05, 2007 Cath (11/20004)-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 Lives alone. I-ADLs End of Life: no resuscitation, no mechanical ventilation, no futile heroic measures. Patient is a former smoker. quit 40 years ago Alcohol Use - yes occ wine Daily Caffeine Use 2 cups coffee/day Illicit Drug Use - no  Review of Systems  The patient denies anorexia, fever, weight loss, weight gain, vision loss, decreased hearing, chest pain, syncope, dyspnea on exertion, peripheral edema, headaches, abdominal pain, hematuria, incontinence, muscle weakness, and difficulty walking.    Physical Exam  General:  alert and well-developed.   Head:  normocephalic and atraumatic.     Eyes:  vision grossly intact, pupils equal, pupils round, and pupils reactive to light.   Ears:  R ear normal and L ear normal.   Mouth:  pharynx pink and moist.   Neck:  supple, no masses, and no thyromegaly.   Lungs:  normal respiratory effort, normal breath sounds, no crackles, and no wheezes.   Heart:  normal rate, regular rhythm, no murmur, no gallop, and no rub.   Abdomen:  soft, non-tender, no guarding, and no rigidity.   Neurologic:  alert & oriented X3, cranial nerves II-XII intact, strength normal in all extremities, sensation intact to light touch, sensation intact to pinprick, gait normal, DTRs symmetrical and normal, finger-to-nose normal, and heel-to-shin normal.   Skin:  turgor normal.   Cervical Nodes:  no anterior cervical adenopathy and no posterior cervical adenopathy.     Impression & Recommendations:  Problem # 1:  TRANSIENT ISCHEMIC ATTACK (ICD-435.9) Patient has had symptoms concerning for a TIA.  She has a history of TIA in 2004.  MRI in 2004 and 2006 showed no abnormalities.  She currently takes an aspirin daily.  Plan- Continue Aspirin 325 mg daily          MRI brain and MRA external vessels.  Her updated medication list for this problem includes:    Bayer Aspirin 325 Mg Tabs (Aspirin) .Marland Kitchen... Take 1 by mouth qd  Orders: Radiology Referral (Radiology)  Problem # 2:  HYPERTENSION (ICD-401.9) Blood pressure is improved since last visit, however patient is intolerant of Bystolic.    Plan- Stop Bystolic          Start Amlodipine 5mg  once daily.  Her updated medication list for this problem includes:    Amlodipine Besylate 5 Mg Tabs (Amlodipine besylate) .Marland Kitchen... 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    Plan- Trial of Vesicare 5mg .  Patient is to call after a week trial and let us know if symptoms are improved.  Will continue vesicare if symptoms improve, will reevaluate if  no improvement.  Complete Medication List: 1)  Bayer Aspirin 325 Mg Tabs (Aspirin) .... Take 1 by mouth qd 2)  Perdiem Overnight Relief 15 Mg Tabs (Sennosides) .... Take 2 at bedtime prn 3)  Miralax Powd (Polyethylene glycol 3350) .... Take 17 grams qd 4)  Canasa 1000 Mg Supp (Mesalamine) .... Insert one into rectum as needed 5)  Amlodipine Besylate 5 Mg Tabs (Amlodipine besylate) .Marland Kitchen.. 1 by mouth once daily   Prescriptions: AMLODIPINE BESYLATE 5 MG TABS (AMLODIPINE BESYLATE) 1 by mouth once daily  #30 x 2   Entered and Authorized by:   Jacques Navy MD   Signed by:   Jacques Navy MD on 03/02/2010   Method used:   Electronically to        [facility] [phone]* (retail)       [address] [place], [place]  [address]       Ph: [phone] or [phone]       Fax: [phone]  RxID:   [id number]

## 2010-11-21 NOTE — Procedures (Signed)
Summary: PH PROBE   pH Probe Study  Procedure date:  08/21/2010  Findings:      Transnasal:  Location: [facility]    NORMAL 24H PH PROBE...DEMEESTER SCORE 8.7 %. TIME LESS THAN4 ONLY IS 2.2%.GOOD CORELATION WITH COUGH AND BRIEF UPRIGHT REFLUX HOWEVER... PLAN...CONTINUE PPI RX...  Appended Document: PH PROBE billed and aware,

## 2010-11-21 NOTE — Assessment & Plan Note (Signed)
Summary: 1 MONTH FU /JMS    History of Present Illness Visit Type: Follow-up Visit Primary GI MD: Sheryn Bison MD FACP FAGA Primary Provider: Illene Regulus, MD Requesting Provider: n/a Chief Complaint: 1 month follow up visit, still c/o a choking sensation, Flo-Vent did not help History of Present Illness:   This Patient continued with choking, hoarseness, and severe coughing. Endoscopy was unremarkable but biopsies suggested possible eosinophilic esophagitis. However, treatment with topical steroids made her symptoms worse. She currently is not on any PPI or H2 blocker. Her extra esophageal symptomatology persists mostly with refractory coughing.   GI Review of Systems    Reports dysphagia with solids.      Denies abdominal pain, acid reflux, belching, bloating, chest pain, dysphagia with liquids, heartburn, loss of appetite, nausea, vomiting, vomiting blood, weight loss, and  weight gain.        Denies anal fissure, black tarry stools, change in bowel habit, constipation, diarrhea, diverticulosis, fecal incontinence, heme positive stool, hemorrhoids, irritable bowel syndrome, jaundice, light color stool, liver problems, rectal bleeding, and  rectal pain.    Current Medications (verified): 1)  Bayer Aspirin 325 Mg  Tabs (Aspirin) .... Take 1 By Mouth Qd 2)  Perdiem Overnight Relief 15 Mg  Tabs (Sennosides) .... Take 2 At Bedtime Prn 3)  Miralax   Powd (Polyethylene Glycol 3350) .... Take 17 Grams Qd 4)  Canasa 1000 Mg  Supp (Mesalamine) .... Insert One Into Rectum As Needed 5)  Amlodipine Besylate 10 Mg Tabs (Amlodipine Besylate) .Marland Kitchen.. 1 By Mouth Once Daily  Allergies (verified): No Known Drug Allergies  Past History:  Family History: Last updated: 12/20/2007 father - deceased @ 36; cancer-bone primary, mother-deceased @49  MVA brother - deceased @67 ; CAD/MI, Neg-breast, colon cancer; DM  Social History: Last updated: 01/14/2009 HSG married '71- widowed '08 1 son -  '76; no grandchildren retired. Lives alone. I-ADLs End of Life: no resuscitation, no mechanical ventilation, no futile heroic measures. Patient is a former smoker. quit 40 years ago Alcohol Use - yes occ wine Daily Caffeine Use 2 cups coffee/day Illicit Drug Use - no  Past medical, surgical, family and social histories (including risk factors) reviewed for relevance to current acute and chronic problems.  Past Medical History: Reviewed history from 07/13/2010 and no changes required. 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 father - deceased @ 92; cancer-bone primary, mother-deceased @49  MVA brother - deceased @67 ; CAD/MI, Neg-breast, colon cancer; DM  Social History: Reviewed history  from 01/14/2009 and no changes required. HSG married '71- widowed '08 1 son - '76; no grandchildren retired. Lives alone. I-ADLs End of Life: no resuscitation, no mechanical ventilation, no futile heroic measures. Patient is a former smoker. quit 40 years ago Alcohol Use - yes occ  wine Daily Caffeine Use 2 cups coffee/day Illicit Drug Use - no  Review of Systems       The patient complains of fatigue and headaches-new.  The patient denies allergy/sinus, anemia, anxiety-new, arthritis/joint pain, back pain, blood in urine, breast changes/lumps, change in vision, confusion, cough, coughing up blood, depression-new, fainting, fever, hearing problems, heart murmur, heart rhythm changes, itching, menstrual pain, muscle pains/cramps, night sweats, nosebleeds, pregnancy symptoms, shortness of breath, skin rash, sleeping problems, sore throat, swelling of feet/legs, swollen lymph glands, thirst - excessive , urination - excessive , urination changes/pain, urine leakage, vision changes, and voice change.    Vital Signs:  Patient profile:   65 year old female Height:      62.5 inches Weight:      105 pounds BMI:     18.97 BSA:     1.46 Pulse rate:   80 / minute Pulse rhythm:   regular BP sitting:   128 / 62  (left arm)  Vitals Entered By: Merri Ray CMA [name] Dull) (August 15, 2010 10:29 AM)  Physical Exam  General:  Well developed, well nourished, no acute distress. Head:  Normocephalic and atraumatic. Eyes:  PERRLA, no icterus.exam deferred to patient's ophthalmologist.  exam deferred to patient's ophthalmologist.   Psych:  Alert and cooperative. Normal mood and affect.   Impression & Recommendations:  Problem # 1:  COUGH (ICD-786.2) Assessment Deteriorated esophageal manometry and 24-hour pH probe testing ordered. Differential diagnosis is between functional problems versus esophageal spasm versus supersensitive esophagitis from acid reflux. We will leave her off all medications at  this time until her workup has been completed.  Problem # 2:  RECTAL FISSURE (ICD-565.0) Assessment: Improved  Other Orders: Mano/24 hour pH Probe (Mano/24 pH)  Patient Instructions: 1)  Copy sent to : Illene Regulus, MD 2)  Your Mano & Ph Study is scheduled for 08/21/2010, please follow your seperate instructions. 3)  Please continue current medications.  4)  The medication list was reviewed and reconciled.  All changed / newly prescribed medications were explained.  A complete medication list was provided to the patient / caregiver.

## 2010-11-23 NOTE — Assessment & Plan Note (Signed)
Summary: REFLUX/YF    History of Present Illness Visit Type: Follow-up Visit Primary GI MD: Sheryn Bison MD FACP FAGA Primary Provider: Illene Regulus, MD Requesting Provider: n/a Chief Complaint: Patient c/o 1.5 months increasing reflux which wakes her at night. She states that if she eats continuously, she is okay but if not, she feels a deep "gnawing pain." She has had increased belching and bloating as well as some midsternal chest disomfort. History of Present Illness:   Crystal [name] has rather typical reflux symptoms with burning substernal pain radiating to her back, also right upper quadrant discomfort without any definite hepatobiliary problems. She also has intermittent bright red blood per rectum with a recent negative colonoscopy except for mild proctitis.  Esophageal manometry was essentially normal, and 24-hour pH probe testing also was unremarkable but she did have documented acid reflux episodes that correlated with coughing. Esophageal biopsies had suggested possible eosinophilic esophagitis, but treatment with topical steroids to her esophagus actually made her worse symptomatically. She continues mild constipation relieved with Perdiem and MiraLax.  She denies anorexia, weight loss, or any specific food intolerances. She does have a history of peripheral vascular disease and is followed by Dr. Illene Regulus.   GI Review of Systems    Reports abdominal pain, acid reflux, belching, bloating, chest pain, and  heartburn.     Location of  Abdominal pain: RUQ.    Denies dysphagia with liquids, dysphagia with solids, loss of appetite, nausea, vomiting, vomiting blood, weight loss, and  weight gain.      Reports irritable bowel syndrome and  rectal bleeding.     Denies anal fissure, black tarry stools, change in bowel habit, constipation, diarrhea, diverticulosis, fecal incontinence, heme positive stool, hemorrhoids, jaundice, light color stool, liver problems, and  rectal  pain. Preventive Screening-Counseling & Management  Caffeine-Diet-Exercise     Does Patient Exercise: no    Current Medications (verified): 1)  Bayer Aspirin 325 Mg  Tabs (Aspirin) .... Take 1 By Mouth Qd 2)  Perdiem Overnight Relief 15 Mg  Tabs (Sennosides) .... Take 2 At Bedtime Prn 3)  Miralax   Powd (Polyethylene Glycol 3350) .... Take 17 Grams Qd 4)  Canasa 1000 Mg  Supp (Mesalamine) .... Insert One Into Rectum As Needed 5)  Amlodipine Besylate 10 Mg Tabs (Amlodipine Besylate) .Marland Kitchen.. 1 By Mouth Once Daily  Allergies (verified): No Known Drug Allergies  Past History:  Past medical, surgical, family and social histories (including risk factors) reviewed for relevance to current acute and chronic problems.  Past Medical History: Reviewed history from 07/13/2010 and no changes required. 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 father - deceased @ 91; cancer-bone primary, mother-deceased @49  MVA brother - deceased @67 ; CAD/MI, Neg-breast, colon cancer; DM No FH of Colon Cancer:  Social History: Reviewed  history from 01/14/2009 and no changes required. HSG married '71- widowed '08 1 son - '76; no grandchildren retired. Lives alone. I-ADLs End of Life: no resuscitation, no mechanical ventilation, no futile heroic measures. Patient is a former smoker. quit 40 years ago Alcohol Use - yes occ wine Daily Caffeine Use 2 cups coffee/day Illicit Drug Use - no Patient does not get regular exercise.  Does Patient Exercise:  no  Review of Systems       The patient complains of allergy/sinus, back pain, change in vision, fatigue, thirst - excessive, urination - excessive, and urine leakage.  The patient denies anemia, anxiety-new, arthritis/joint pain, blood in urine, breast changes/lumps, confusion, cough, coughing up blood, depression-new, fainting, fever, headaches-new, hearing problems, heart murmur, heart rhythm changes, itching, menstrual pain, muscle pains/cramps, night sweats, nosebleeds, pregnancy symptoms, shortness of breath, skin rash, sleeping problems, sore throat, swelling of feet/legs, swollen lymph glands, thirst - excessive , urination - excessive , urination changes/pain, vision changes, and voice change.    Vital Signs:  Patient profile:   65 year old female Height:      62.5 inches Weight:      103.50 pounds BMI:     18.70 BSA:     1.45 Pulse rate:   88 / minute Pulse rhythm:   regular BP sitting:   150 / 86  (left arm)  Vitals Entered By: Lamona Curl CMA [name] Dull) (November 07, 2010 10:06 AM)  Physical Exam  General:  Well developed, well nourished, no acute distress.healthy appearing.  healthy appearing.   Head:  Normocephalic and atraumatic. Eyes:  PERRLA, no icterus.exam deferred to patient's  ophthalmologist.  exam deferred to patient's ophthalmologist.   Lungs:  Clear throughout to auscultation. Heart:  Regular rate and rhythm; no murmurs, rubs,  or bruits. Abdomen:  Soft, nontender and nondistended. No masses, hepatosplenomegaly or hernias noted. Normal bowel sounds. Epigastric bruit noted with palpable aorta which does not seem enlarged. Her liver edge is palpable that is nonnodular and nontender. I cannot appreciate splenomegaly, other abdominal masses or tenderness. Extremities:  No clubbing, cyanosis, edema or deformities noted. Neurologic:  Alert and  oriented x4;  grossly normal neurologically. Psych:  Alert and cooperative. Normal mood and affect.   Impression & Recommendations:  Problem # 1:  ABDOMINAL PAIN RIGHT UPPER QUADRANT (ICD-789.01) Assessment Unchanged rlabs and upper abdominal ultrasound exam to exclude cholelithiasis or any structural liver lesions. Orders: TLB-CBC Platelet - w/Differential (85025-CBCD) TLB-BMP (Basic Metabolic Panel-BMET) (80048-METABOL) TLB-Hepatic/Liver Function Pnl (80076-HEPATIC) TLB-TSH (Thyroid Stimulating Hormone) (84443-TSH) TLB-B12, Serum-Total ONLY (16109-U04) TLB-Ferritin (82728-FER) TLB-Folic Acid (Folate) (82746-FOL) TLB-IBC Pnl (Iron/FE;Transferrin) (83550-IBC) TLB-Sedimentation Rate (ESR) (85652-ESR) TLB-IgA (Immunoglobulin A) (82784-IGA) T-Tissue Transglutamase Ab IgA (54098-11914) Ultrasound Abdomen (UAS)  Problem # 2:  COUGH (ICD-786.2) Assessment: Unchanged Her Cough is correlated with acid reflux and I have restarted Dexilant 60 mg a day with standard antireflux maneuvers.  Problem # 3:  PERIPHERAL VASCULAR DISEASE (ICD-443.9) Assessment: Unchanged  Problem # 4:  MELANOSIS COLI (ICD-569.89) Assessment: Comment Only  Problem # 5:  RECTAL FISSURE (ICD-565.0) Assessment: Improved p.r.n. Canasa 1 g suppositories.  Problem # 6:  GERD (ICD-530.81) Assessment: Deteriorated  Standard reflux maneuvers with  Dexilant 60 mg a day in office followup in one month's time. Orders: TLB-CBC Platelet - w/Differential (85025-CBCD) TLB-BMP (Basic Metabolic Panel-BMET) (80048-METABOL) TLB-Hepatic/Liver Function Pnl (80076-HEPATIC) TLB-TSH (Thyroid Stimulating Hormone) (84443-TSH) TLB-B12, Serum-Total ONLY (78295-A21) TLB-Ferritin (82728-FER) TLB-Folic Acid (Folate) (82746-FOL) TLB-IBC Pnl (Iron/FE;Transferrin) (83550-IBC) TLB-Sedimentation Rate (ESR) (85652-ESR) TLB-IgA (Immunoglobulin A) (82784-IGA) T-Tissue  Transglutamase Ab IgA [phone])  Patient Instructions: 1)  Copy sent to : Illene Regulus, MD 2)  Please go to the basement today for your labs.  3)  Your prescription(s) have been sent to you pharmacy.  4)  Your abdominal ultrasound is scheduled for 11/14/2010, please follow the seperate instructions.  5)  The medication list was reviewed and reconciled.  All changed / newly prescribed medications were explained.  A complete medication list was provided to the patient / caregiver. 6)  Avoid foods high in acid content ( tomatoes, citrus juices, spicy foods) . Avoid eating within 3 to 4 hours of lying down or before exercising. Do not over eat; try smaller more frequent meals. Elevate head of bed four inches when sleeping.   Appended Document: REFLUX/YF Dexiant is not covered by her insurance so I set Lansoprazole instead.   Clinical Lists Changes  Medications: Added new medication of LANSOPRAZOLE 30 MG CPDR (LANSOPRAZOLE) take one by mouth once daily - Signed Rx of LANSOPRAZOLE 30 MG CPDR (LANSOPRAZOLE) take one by mouth once daily;  #30 x 6;  Signed;  Entered by: Harlow Mares CMA (AAMA);  Authorized by: Mardella Layman MD [facility];  Method used: Electronically to [phone]*, [address], [place], [place], [place]  [address], Ph: [phone] or [phone], Fax: [phone]    Prescriptions: LANSOPRAZOLE 30 MG CPDR (LANSOPRAZOLE) take one by mouth once daily  #30 x  6   Entered by:   Harlow Mares CMA (AAMA)   Authorized by:   Mardella Layman MD [facility]   Signed by:   Harlow Mares CMA (AAMA) on 11/07/2010   Method used:   Electronically to        [facility] [phone]* (retail)       [address] [place], [place]  [address]       Ph: [phone] or [phone]       Fax: [phone]   RxID:   [id number]

## 2010-12-07 ENCOUNTER — Encounter: Payer: Self-pay | Admitting: Gastroenterology

## 2010-12-07 ENCOUNTER — Ambulatory Visit (INDEPENDENT_AMBULATORY_CARE_PROVIDER_SITE_OTHER): Payer: Medicare Other | Admitting: Gastroenterology

## 2010-12-07 DIAGNOSIS — K219 Gastro-esophageal reflux disease without esophagitis: Secondary | ICD-10-CM

## 2010-12-13 NOTE — Assessment & Plan Note (Addendum)
Summary: 1 month follow-up    History of Present Illness Visit Type: Follow-up Visit Primary GI MD: Sheryn Bison MD FACP FAGA Primary Provider: Illene Regulus, MD Requesting Provider: n/a Chief Complaint: 1 month follow-up pt. c/o reflux and nausea History of Present Illness:   Markedly Improved without real complaints today except for some early satiety. Review of all labs was unremarkable.   GI Review of Systems    Reports abdominal pain, acid reflux, and  nausea.     Location of  Abdominal pain: upper abdomen.    Denies belching, bloating, chest pain, dysphagia with liquids, dysphagia with solids, heartburn, loss of appetite, vomiting, vomiting blood, weight loss, and  weight gain.        Denies anal fissure, black tarry stools, change in bowel habit, constipation, diarrhea, diverticulosis, fecal incontinence, heme positive stool, hemorrhoids, irritable bowel syndrome, jaundice, light color stool, liver problems, rectal bleeding, and  rectal pain.    Current Medications (verified): 1)  Bayer Aspirin 325 Mg  Tabs (Aspirin) .... Take 1 By Mouth Qd 2)  Perdiem Overnight Relief 15 Mg  Tabs (Sennosides) .... Take 2 At Bedtime Prn 3)  Miralax   Powd (Polyethylene Glycol 3350) .... Take 17 Grams Qd 4)  Canasa 1000 Mg  Supp (Mesalamine) .... Insert One Into Rectum As Needed 5)  Amlodipine Besylate 10 Mg Tabs (Amlodipine Besylate) .Marland Kitchen.. 1 By Mouth Once Daily 6)  Lansoprazole 30 Mg Cpdr (Lansoprazole) .... Take One By Mouth Once Daily  Allergies (verified): No Known Drug Allergies  Past History:  Past medical, surgical, family and social histories (including risk factors) reviewed for relevance to current acute and chronic problems.  Past Medical History: Reviewed history from 07/13/2010 and no changes required. 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 father - deceased @ 18; cancer-bone primary, mother-deceased @49  MVA brother - deceased @67 ; CAD/MI, Neg-breast, colon cancer; DM No FH of Colon Cancer:  Social History: Reviewed history from 11/07/2010 and no changes required. HSG married '71- widowed '08 1 son - '76; no grandchildren retired. Lives alone. I-ADLs End of Life: no resuscitation, no mechanical ventilation, no futile heroic measures. Patient is a former smoker. quit 40 years ago Alcohol Use - yes occ wine Daily Caffeine Use 2 cups coffee/day Illicit Drug Use - no Patient does not get regular exercise.   Review of Systems  The patient denies allergy/sinus, anemia, anxiety-new, arthritis/joint pain, back pain, blood in urine, breast changes/lumps, change in vision, confusion, cough, coughing up blood, depression-new, fainting,  fatigue, fever, headaches-new, hearing problems, heart murmur, heart rhythm changes, itching, muscle pains/cramps, night sweats, nosebleeds, shortness of breath, skin rash, sleeping problems, sore throat, swelling of feet/legs, swollen lymph glands, thirst - excessive, urination - excessive, urination changes/pain, urine  leakage, vision changes, and voice change.    Vital Signs:  Patient profile:   65 year old female Height:      62.5 inches Weight:      106 pounds BMI:     19.15 Pulse rate:   88 / minute Pulse rhythm:   regular BP sitting:   128 / 72  (left arm)  Vitals Entered By: Milford Cage NCMA (December 07, 2010 10:33 AM)  Physical Exam  General:  Well developed, well nourished, no acute distress.healthy appearing.   Head:  Normocephalic and atraumatic. Eyes:  PERRLA, no icterus.exam deferred to patient's ophthalmologist.   Psych:  Alert and cooperative. Normal mood and affect.   Impression & Recommendations:  Problem # 1:  GERD (ICD-530.81) Assessment Improved Continue Lansoprazole 30 mg a day and we'll add Reglan 10 mg at bedtime as tolerated. Standard anti-reflex maneuvers to continue.  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 Canasa 1 g suppositories, continue Perdiem as directed.  Patient Instructions: 1)  Copy sent to : Illene Regulus, MD 2)  Your prescription(s) have been sent to you pharmacy.  3)  Please continue current medications.  4)  The medication list was reviewed and reconciled.  All changed / newly prescribed medications were explained.  A complete medication list was provided to the patient / caregiver. 5)  Please schedule a follow-up appointment as needed.  6)  Avoid foods high in acid content ( tomatoes, citrus juices, spicy foods) . Avoid eating within 3 to 4 hours of lying down or before exercising. Do not over eat; try smaller more frequent  meals. Elevate head of bed four inches when sleeping.  Prescriptions: METOCLOPRAMIDE HCL 10 MG TABS (METOCLOPRAMIDE HCL) take one by mouth at bedtime  #30 x 6   Entered by:   Harlow Mares CMA (AAMA)   Authorized by:   Mardella Layman MD [facility]   Signed by:   Mardella Layman MD [facility] on 12/07/2010   Method used:   Electronically to        [facility] [phone]* (retail)       [address] [place], [place]  [address]       Ph: [phone] or [phone]       Fax: [phone]   RxID:   [id number]

## 2011-01-04 ENCOUNTER — Ambulatory Visit (INDEPENDENT_AMBULATORY_CARE_PROVIDER_SITE_OTHER): Payer: Medicare Other | Admitting: Internal Medicine

## 2011-01-04 ENCOUNTER — Encounter: Payer: Self-pay | Admitting: Internal Medicine

## 2011-01-04 DIAGNOSIS — Z0000 Encounter for general adult medical examination without abnormal findings: Secondary | ICD-10-CM

## 2011-01-04 DIAGNOSIS — I872 Venous insufficiency (chronic) (peripheral): Secondary | ICD-10-CM

## 2011-01-04 DIAGNOSIS — G459 Transient cerebral ischemic attack, unspecified: Secondary | ICD-10-CM

## 2011-01-04 DIAGNOSIS — R1011 Right upper quadrant pain: Secondary | ICD-10-CM

## 2011-01-18 NOTE — Assessment & Plan Note (Signed)
Summary: YEARLY MEDICARE PHYSICAL-LB   Vital Signs:  Patient profile:   64 year old female Height:      62.5 inches Weight:      107 pounds BMI:     19.33 O2 Sat:      98 % on Room air Temp:     98.3 degrees F oral Pulse rate:   78 / minute BP sitting:   146 / 90  (left arm) Cuff size:   regular  Vitals Entered By: Bill Salinas CMA (January 04, 2011 10:09 AM)  O2 Flow:  Room air CC: yearly/ ab   Primary Care Provider:  Illene Regulus, MD  CC:  yearly/ ab.  History of Present Illness: Crystal [name] presents for an annual medicare wellness exam. she has been doing well in the interval. she has been seeing Dr. Jarold Motto for evaluation and is doing well at this time. She has had a normal GB u/s except for a polyp; celiac panel negative, sed rate normal , LFTs normal, general chemistry normal, IgA normal. Her GERD is controlled, her IBS is stable.   She is 100% independent in ADLs. She has had no falls. She is cognitively intact handling all her affairs financially and personally. She has no signs or symptoms of depression.   Preventive Screening-Counseling & Management  Alcohol-Tobacco     Alcohol drinks/day: <1     Alcohol type: wine and vodka     Alcohol Counseling: not indicated; use of alcohol is not excessive or problematic     Smoking Status: quit     Year Quit: 1967  Caffeine-Diet-Exercise     Caffeine use/day: 4 cups     Diet Comments: regular diet     Does Patient Exercise: yes     Type of exercise: walking     Times/week: 3  Hep-HIV-STD-Contraception     Hepatitis Risk: no risk noted     HIV Risk: no risk noted     STD Risk: no risk noted     Dental Visit-last 6 months yes     SBE monthly: yes     Sun Exposure-Excessive: no  Safety-Violence-Falls     Seat Belt Use: yes     Helmet Use: n/a     Firearms in the Home: no firearms in the home     Smoke Detectors: yes     Violence in the Home: no risk noted     Sexual Abuse: no     Fall Risk: low fall risk     Drug Use:  never.        Blood Transfusions:  no.    Current Medications (verified): 1)  Bayer Aspirin 325 Mg  Tabs (Aspirin) .... Take 1 By Mouth Qd 2)  Perdiem Overnight Relief 15 Mg  Tabs (Sennosides) .... Take 2 At Bedtime Prn 3)  Miralax   Powd (Polyethylene Glycol 3350) .... Take 17 Grams Qd 4)  Canasa 1000 Mg  Supp (Mesalamine) .... Insert One Into Rectum As Needed 5)  Amlodipine Besylate 10 Mg Tabs (Amlodipine Besylate) .Marland Kitchen.. 1 By Mouth Once Daily 6)  Lansoprazole 30 Mg Cpdr (Lansoprazole) .... Take One By Mouth Once Daily 7)  Metoclopramide Hcl 10 Mg Tabs (Metoclopramide Hcl) .... 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 Lives alone. I-ADLs End of Life: no resuscitation, no mechanical ventilation, no futile heroic measures. Patient is a former smoker. quit 40 years ago Alcohol Use - yes occ wine Daily Caffeine Use 2 cups coffee/day Illicit Drug Use - no Patient does not get regular exercise.   Social History: Does Patient Exercise:  yes Dental Care w/in 6 mos.:  yes Sun Exposure-Excessive:  no Seat Belt Use:  yes Fall Risk:  low fall risk Caffeine use/day:  4 cups Hepatitis Risk:  no risk noted HIV Risk:  no risk noted STD Risk:  no risk noted Drug Use:  never Blood Transfusions:  no  Review of Systems  The patient denies anorexia, fever, weight loss, weight gain, decreased hearing, chest pain, syncope, dyspnea on exertion, prolonged cough, headaches, abdominal pain, hematochezia, severe indigestion/heartburn, incontinence, muscle weakness, transient blindness, difficulty walking, unusual weight change, abnormal bleeding, enlarged lymph nodes, and breast masses.    Physical Exam  General:  Well-developed,well-nourished,in no acute distress; alert,appropriate and cooperative throughout examination Head:  Normocephalic and atraumatic without obvious abnormalities. No apparent alopecia or balding. Eyes:  vision grossly intact, pupils equal, pupils round, pupils react to accomodation, and corneas and lenses clear.   Ears:  External ear exam shows no significant lesions or deformities.  Otoscopic examination reveals clear canals, tympanic membranes are intact bilaterally without bulging, retraction, inflammation or discharge. Hearing is grossly normal bilaterally. Nose:  no external deformity and no external erythema.    Mouth:  Oral mucosa and oropharynx without lesions or exudates.  Teeth in good repair. Neck:  supple, full ROM, no thyromegaly, and no carotid bruits.   Chest Wall:  No deformities, masses, or tenderness noted. Breasts:  deferred Lungs:  Normal respiratory effort, chest expands symmetrically. Lungs are clear to auscultation, no crackles or wheezes. Heart:  Normal rate and regular rhythm. S1 and S2 normal without gallop, murmur, click, rub or other extra sounds. Abdomen:  soft and normal bowel sounds.   Genitalia:  deferred Msk:  normal ROM, no joint tenderness, no joint swelling, no joint warmth, and no joint deformities.   Pulses:  2+ radial and DP pulses Extremities:  No clubbing, cyanosis, edema, or deformity noted with normal full range of motion of all joints.   Neurologic:  alert & oriented X3, cranial nerves II-XII intact, gait normal, and DTRs symmetrical and normal.   Skin:  turgor normal, color normal, no suspicious lesions, no ecchymoses, and no ulcerations.   Cervical Nodes:  no anterior cervical adenopathy and no posterior cervical adenopathy.   Axillary Nodes:  no R axillary adenopathy and no L axillary adenopathy.   Psych:  Oriented X3, normally  interactive, good eye contact, and not anxious appearing.     Impression & Recommendations:  Problem # 1:  ABDOMINAL PAIN RIGHT UPPER QUADRANT (ICD-789.01) Full work-up with Dr. Jarold Motto. No abnormalities revealed. Her symptoms have abated.  Plan - follow-up with Dr. Jarold Motto as instructed  Her updated medication list for this problem includes:    Canasa 1000 Mg Supp (Mesalamine) ..... Insert one into rectum as needed    Metoclopramide Hcl 10 Mg Tabs (Metoclopramide hcl) .Marland Kitchen... 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  Problem # 3:  TRANSIENT ISCHEMIC ATTACK (ICD-435.9) No recurrent symptoms or neurologic issues.  Plan -  continue aspiri prophylaxis.  Her updated medication list for this problem includes:    Bayer Aspirin 325 Mg Tabs (Aspirin) .Marland Kitchen... Take 1 by mouth qd  Problem # 4:  IRRITABLE BOWEL SYNDROME (ICD-564.1) Currently stable.  Problem # 5:  HYPERLIPIDEMIA (ICD-272.4) last lipid pnel April '10 with very protective HDL at 56.1, close to normal LDL at 137.5. She follows a healthy diet.  Plan - will defer redraw of blood           continue present heart healthy low fat diet           lipid panel at next annual blood draw.   Problem # 6:  HYPERTENSION (ICD-401.9)  Her updated medication list for this problem includes:    Amlodipine Besylate 10 Mg Tabs (Amlodipine besylate) .Marland Kitchen... 1 by mouth once daily  BP today: 146/90 Prior BP: 128/72 (12/07/2010)  Labs Reviewed: K+: 4.9 (11/07/2010) Creat: : 0.7 (11/07/2010)     Mild elevation at today's visit with normal readings recently.  PLan - continue present medical regimen.  Problem # 7:  Preventive Health Care (ICD-V70.0)  Interval history with RUQ abdominal pain thoroughly evaluated with no findings and now the pain is resolved. No other interval events. Limited physical exam is normal. She is current with her gynecologist. Current with mamography. Up to date with colorectal cancer screening with last exam March '10. Immunizations: tetnus April '11; pneumonia vaccine October '09. Shingles deferred for lack of coverage.   In summary a very nice woman who appears to be medically stable at this time a doing well. She will return as needed.   Orders: Medicare -1st Annual Wellness Visit [phone])  Complete Medication List: 1)  Bayer Aspirin 325 Mg Tabs (Aspirin) .... Take 1 by mouth qd 2)  Perdiem Overnight Relief 15 Mg Tabs (Sennosides) .... Take 2 at bedtime prn 3)  Miralax Powd (Polyethylene glycol 3350) .... Take 17 grams qd 4)  Canasa 1000 Mg Supp (Mesalamine) .... Insert one into rectum as needed 5)  Amlodipine Besylate 10 Mg Tabs  (Amlodipine besylate) .Marland Kitchen.. 1 by mouth once daily 6)  Lansoprazole 30 Mg Cpdr (Lansoprazole) .... Take one by mouth once daily 7)  Metoclopramide Hcl 10 Mg Tabs (Metoclopramide hcl) .... Take one by mouth at bedtime   Orders Added: 1)  Medicare -1st Annual Wellness Visit [[id number]]

## 2011-02-26 ENCOUNTER — Other Ambulatory Visit: Payer: Self-pay | Admitting: Internal Medicine

## 2011-03-06 NOTE — Assessment & Plan Note (Signed)
[facility]                         GASTROENTEROLOGY OFFICE NOTE   NAME:[name], Crystal Donning                   MRN:          [id number]  DATE:05/20/2007                            DOB:          11-15-1945    Crystal [name] is completely asymptomatic on Prevacid and has no reflux  symptoms or abdominal pain.  Her constipation has been relieved with  MiraLax.  She underwent endoscopy on April 21, 2007, which was fairly  unremarkable.  CLO biopsy was negative.   I discussed the acid reflux management protocol with Crystal [name] and will  leave her on Prevacid along with MiraLax for her chronic complaints.  I  suspect she does have chronic acid reflux with a negative endoscopy and  is very sensitive to acid regurgitation.  We will follow her on a p.r.n.  basis along with Dr. Debby Bud.     Vania Rea. Jarold Motto, MD, Caleen Essex, FAGA  Electronically Signed    DRP/MedQ  DD: 05/20/2007  DT: 05/20/2007  Job #: [id number]   cc:   Rosalyn Gess. Norins, MD

## 2011-03-06 NOTE — Assessment & Plan Note (Signed)
[facility]                         GASTROENTEROLOGY OFFICE NOTE   NAME:Crystal [name], Crystal [name]                   MRN:          [id number]  DATE:04/11/2007                            DOB:          21-Jul-1946    Crystal [name], for the last 6 weeks, has had recurrent burning and gnawing  epigastric pain with typical peptic ulcer symptomatology.  It awakens  her from sleep at night and is made better by antacids and food.  She  also has some radiation of the pain into her right posterior back area,  but has no genitourinary or other gastrointestinal symptoms.  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  Her last endoscopic exam was approximately 3 years ago.  She  has also had normal colonoscopies and does suffer from IBS.  There has  been some question in the past as to whether or not the patient has  delayed gastric emptying and a diffuse GI motility disorder.  On  reviewing her chart, she did have severe H. pylori gastritis  approximately 5 years ago that was treated successfully with antibiotic  therapy.   Additional medical problems have included a history of rectal stenosis  form hemorrhoid surgery, borderline hypertension, and previous  appendectomy and hysterectomy.  She denies currently any hepatobiliary  complaints or history of pancreatitis, or hepatitis, abuse of NSAID,  cigarettes or alcohol.   CURRENT MEDICATIONS:  1. Aspirin 325 mg a day.  2. Perdiem daily.  3. Diuretics.  4. MiraLax daily.   PHYSICAL EXAM:  She is a healthy-appearing white female in no distress,  appearing her stated age.  She weighs 115 pounds, which is her normal weight.  Her blood pressure  is 110/70.  Pulse was 80 and regular.  I could not appreciate scleral icterus or stigmata of chronic liver  disease.  Chest was clear.  There were no murmurs, gallops, or rubs.  She was in a regular rhythm.  ABDOMEN:  She had no  distension, organomegaly, masses, and only mild  epigastric tenderness without rebound.  Bowel sounds normal.  The extremities were unremarkable.  Mental status was clear.   ASSESSMENT:  Certainly sounds like Crystal [name] has peptic ulcer disease  and may have recurrence of Helicobacter pylori infection versus  functional dyspepsia.   RECOMMENDATIONS:  1. Repeat ultrasound and endoscopic exam.  2. Check screening laboratory parameters.  3. Check exam for H. pylori at the time of gastroscopy.  4. Start Prevacid 30 mg 30 minutes before breakfast each morning.     Vania Rea. Jarold Motto, MD, Caleen Essex, FAGA  Electronically Signed    DRP/MedQ  DD: 04/11/2007  DT: 04/11/2007  Job #: [id number]

## 2011-03-08 ENCOUNTER — Telehealth: Payer: Self-pay | Admitting: *Deleted

## 2011-03-08 MED ORDER — ZOSTER VACCINE LIVE 19400 UNT/0.65ML ~~LOC~~ SOLR: 0.6500 mL | Freq: Once | SUBCUTANEOUS | Status: AC

## 2011-03-08 NOTE — Telephone Encounter (Signed)
Patient requesting a call back regarding shingles vaccine.

## 2011-03-08 NOTE — Telephone Encounter (Signed)
Rx sent in, Patient informed  

## 2011-03-09 NOTE — Assessment & Plan Note (Signed)
[facility]                           PRIMARY CARE OFFICE NOTE   NAME:Crystal [name]                   MRN:          [id number]  DATE:10/30/2006                            DOB:          1945-11-15    Ms. File is a delightful 65 year old woman who presents for followup  evaluation and exam.  She was last seen in the office on July 08, 2006 for reaction to cholesterol medications with muscle aches and pains  after being on Pravachol.  Of note, the patient now has failed multiple  statin drugs, as well as Lopid.  She has been seen in the lipid clinic  as recently as April 25, 2006.  At this time off medications, her muscle  aches, pains and discomforts have resolved.   CARDIAC RISKS:  The patient has hypertension and mild hyperlipidemia.  She otherwise is negative for diabetes, family history, obesity,  sedentary lifestyle or tobacco abuse.   Using Framingham hard data risk evaluation, her 10-year risk is 4% with  an LDL goal of less than 130.   PAST MEDICAL HISTORY:   SURGICAL:  1. 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  2. Hysterectomy.  3. Left breast lumpectomy x2.  4. Hemorrhoidal surgery.  5. Appendectomy.   MEDICAL ILLNESSES:  1. Usual childhood disease.  2. Hypertension.  3. Hyperlipidemia.  4. Hemorrhoids.  5. GERD.  6. IBS.  7. Mild hyperlipidemia.  8. Degenerative disk disease with back pain.   CHART REVIEW:  Last colonoscopy May 14, 2005 with diverticulosis,  external hemorrhoids with no other changes.  Last hospitalization was in  2004.  The patient did have a cardiac catheterization associated with  that visit which showed no coronary artery disease or obstruction with a  normal left ventricle.  Last mammogram dates from May 20, 2006 and was  a normal study.  The patient has had a lumbar myelogram performed in  November of 2005 which showed a disk protrusion at L4-5, mild narrowing  of the lateral  recesses.  The last EKG was in 2003 and was unremarkable.   FAMILY HISTORY:  Father died of cancer.  Mother was killed in a motor  vehicle accident.  The patient has a brother who died of a massive MI at  age 10.   SOCIAL HISTORY:  The patient is married.  She has 1 son.  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  She and her husband travel.  She remains very  active.   CURRENT MEDICATIONS:  1. Aspirin 325 mg daily.  2. Perdiem 2 tablet daily.  3. Uniretic 25/15 one-half tablet daily.  4. Glycolax 17 gm daily.   REVIEW OF SYSTEMS:  The patient has had no constitution, cardiovascular,  respiratory, GI or GU complaints or problems.   PHYSICAL EXAMINATION:  VITAL SIGNS:  Temperature was 97.4, blood  pressure 138/87, pulse was 90, weight 116.  GENERAL APPEARANCE:  This a slender woman looking under her stated  chronologic age in no acute distress.  HEENT:  Normocephalic and atraumatic.  EAC's and TM's were normal.  Oropharynx with native dentition  in good repair.  No buccal or palatal  lesions were normal.  Posterior pharynx was clear  Conjunctivae and  sclerae were clear.  Pupils equal, round and reactive to light and  accommodation.  Extraocular movements intact.  Funduscopic examination  was unremarkable.  NECK:  Supple without thyromegaly.  NODES:  No adenopathy was noted in the cervical or supraclavicular  regions.  CHEST:  No CVA tenderness.  LUNGS:  Clear to auscultation and percussion.  BREAST EXAM:  Deferred to gynecology.  CARDIOVASCULAR:  There were 2+ radial pulses.  No JVD or carotid bruit.  She had a quiet precordium with regular rate and rhythm without murmurs,  rubs, or gallops.  ABDOMEN:  Soft.  No guarding or rebound.  No organosplenomegaly was  noted.  RECTAL/PELVIC:  Deferred.  EXTREMITIES:  Without clubbing, cyanosis, edema or deformity.  NEUROLOGIC:  Nonfocal.   DATABASE:  A 12-lead electrocardiogram revealed a normal sinus rhythm  with a normal EKG.    Lipid panel with a cholesterol of 207, triglycerides of 58, HDL of 58.6,  LDL of 134.5.   ASSESSMENT AND PLAN:  1. Irritable bowel syndrome, stable.  The patient will continue with      Glycolax.  2. Gastroesophageal reflux disease.  The patient with no active      symptoms at this time, and is doing well without medication.  3. Hypertension.  The patient's blood pressure is adequately      controlled at 138/87, and she will continue her Uniretic.  4. Lipids.  The patient's LDL cholesterol at 134.5 is actually close      to goal of 130 or less.  In reviewing her record, the patient has      had an LDL cholesterol as high as 166.  At this time, without      medication, as noted, she is close to goal and does not require      medical intervention at this time.  5. Health maintenance.  The patient is current with her gynecologist.      Last pelvic and Pap in August of 2007, and she is status post      hysterectomy.  She is current with colorectal cancer screening.      EKG is normal.   In summary, this is a very pleasant patient who is medically stable at  this time.  I have asked her to return to see me in 1 year or on a  p.r.n. basis.     Rosalyn Gess Norins, MD  Electronically Signed    MEN/MedQ  DD: 10/30/2006  DT: 10/30/2006  Job #: [id number]   cc:   Jeral Fruit

## 2011-03-09 NOTE — Cardiovascular Report (Signed)
NAMESARABELLE, Crystal [name]                      ACCOUNT NO.:  [id number]   MEDICAL RECORD NO.:  [id number]                   PATIENT TYPE:  OIB   LOCATION:  2899                                 FACILITY:  [facility]   PHYSICIAN:  Jonelle Sidle, M.D. [facility]        DATE OF BIRTH:  Apr 23, 1946   DATE OF PROCEDURE:  09/15/2003  DATE OF DISCHARGE:                              CARDIAC CATHETERIZATION   PRIMARY CARE PHYSICIAN:  Rosalyn Gess. Norins, M.D. [facility]   [place] CARDIOLOGIST:  Rollene Rotunda, M.D.   INDICATIONS:  [name] is a 65 year old woman with a history of  hypertension, dyslipidemia, gastroesophageal reflux disease, and family  history of coronary artery disease.  She has had recent episodes of chest  discomfort and is referred for the assessment of her coronary anatomy via  coronary angiography.   PROCEDURES PERFORMED:  1. Left heart catheterization.  2. Selective coronary angiography.  3. Left ventriculography.   ACCESS AND EQUIPMENT:  The area about the right femoral artery was  anesthetized with 1% lidocaine and a 6-French sheath was placed in the right  femoral artery via the modified Seldinger technique.  Standard preformed 6-  [place] and JR4 catheters were used for selective coronary angiography  and an angled pigtail catheter was used for left heart catheterization, left  ventriculography.  All exchanges were made over a wire and the patient  tolerated the procedure well without immediate complications.   HEMODYNAMICS:  1. Left ventricle 140/11.  2. Aorta 140/75.   ANGIOGRAPHIC FINDINGS:  1. The left main coronary artery is free of significant flow limiting     coronary atherosclerosis.  2. The left anterior descending has two small diagonal branches and is     itself medium to large in caliber wrapping around the apex.  There is no     significant flow limiting coronary atherosclerosis noted.  3. The circumflex coronary artery is large and dominant with three  obtuse     marginal branches and a posterior descending branch.  No significant flow     limiting coronary atherosclerosis is noted.  4. The right coronary artery is small and nondominant without significant     flow limiting coronary atherosclerosis.   LEFT VENTRICULOGRAPHY:  Left ventriculography was performed in the RAO  projection revealing an ejection fraction of 55-60% with no focal wall  motion abnormalities and no significant mitral regurgitation.   DIAGNOSES:  1. No significant flow limiting coronary atherosclerosis within the major     epicardial vessels.  2. Left ventricular ejection fraction in the range of 55-60% without     significant mitral regurgitation or focal wall motion abnormalities.   RECOMMENDATIONS:  Continue risk factor modification.  Would search for other  etiologies of chest discomfort.  Jonelle Sidle, M.D. [facility]    SGM/MEDQ  D:  09/15/2003  T:  09/15/2003  Job:  [id number]

## 2011-03-09 NOTE — H&P (Signed)
NAME:  Crystal [name], Crystal [name]                      ACCOUNT NO.:  [id number]   MEDICAL RECORD NO.:  [id number]                   PATIENT TYPE:  EMS   LOCATION:  ED                                   FACILITY:  [facility]   PHYSICIAN:  Rosalyn Gess. Norins, M.D. [facility]         DATE OF BIRTH:  [birth year]   DATE OF ADMISSION:  12/18/2002  DATE OF DISCHARGE:                                HISTORY & PHYSICAL   CHIEF COMPLAINT:  Transient memory loss.   HISTORY OF PRESENT ILLNESS:  The patient is a very pleasant 65 year old  married white female with a history of syncope thought to be caused by  hypotension in the past, but no history of prior cerebrovascular disease.  The patient reports that this past Tuesday she had an episode in the early  morning of nausea, became diaphoretic, laid down, and then had a 30 second  loss of consciousness.  Her husband was present and witnessed this.  There  was no tonic-clonic movement or other seizure activity.   On the morning of admission the patient went to her usual activities but the  last thing she remembers is making breakfast.  After that she has no memory  until she was in the emergency department.  Her husband reports he found her  in the kitchen with her make-up kit in her hand saying that something was  wrong.  At the time of admission examination she is awake and alert, but  still has only partial memory of the morning's events.  She is now admitted  for telemetry monitoring, neurologic evaluation to rule out CVD/TIA.  Of  note, the patient does have a sinus infection for which she is taking  amoxicillin.   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  She had an abdominal hysterectomy with  appendectomy secondary to endometriosis.  Prior to that she had a BTL.  The  patient had a benign tumor removed from her left breast.   PAST MEDICAL HISTORY:  She had the usual childhood diseases.  She is a  gravida 1, para 1.  She has irritable bowel syndrome.  She has had a problem  with intermittent hypertension, currently on no medications.  She has had  mildly elevated cholesterol, but has been intolerant of Lipitor and Crestor.  Physical roster:  Vania Rea. Jarold Motto, M.D. [facility] for GI, Corwin Levins, M.D.  [facility] for primary care, Colon Flattery, M.D. in [place] for local care.   CURRENT MEDICATIONS:  Zelnorm 6 mg daily.   HABITS:  Tobacco none.  Alcohol 1-2 ounces per month.   ALLERGIES:  SULFA causes a rash.   FAMILY HISTORY:  Negative for breast cancer, colon cancer, coronary artery  disease, diabetes, or hypertension.   SOCIAL HISTORY:  The patient is married 32 years.  She has a son age 68.  She is a full-time Therapist, music.  REVIEW OF SYSTEMS:  The patient has had no constitutional symptoms.  Specifically denies fevers, chills.  Her weight has been stable.  She does  report increased fatigue and decreased exercise tolerance.  The patient has  had no ENT complaints.  She has had no visual changes or problems.  She has  had no cardiovascular or respiratory complaints except she does have  occasional palpitations.  She has no GI complaints except for IBS which is  well controlled.   HEALTH MAINTENANCE:  Last mammogram in 2003.  Last pelvic and Pap in 2003  despite being status post hysterectomy.  Last colonoscopy in 2002.   PHYSICAL EXAMINATION:  VITAL SIGNS:  Temperature was 96.9, blood pressure  initially 192/109, follow-up blood pressure not posted, heart rate 99,  respirations 18.  GENERAL:  Well-nourished, well-developed Caucasian woman in no acute  distress.  HEENT:  Normocephalic, atraumatic.  No tenderness to percussion over frontal  or maxillary sinuses.  Conjunctiva and sclera were clear.  Oropharynx  without lesions.  NECK:  Supple.  No thyromegaly.  NODES:  No adenopathy noted in the cervical or supraclavicular region.  CHEST:  No CVA tenderness.  LUNGS:  Clear  to auscultation and percussion.  CARDIOVASCULAR:  2+ peripheral pulses.  No JVD or carotid bruits.  She had a  quiet epicordium with a regular rate and rhythm.  No murmurs, rubs, or  gallops were appreciated.  BREASTS:  Deferred to outpatient examination.  ABDOMEN:  Soft.  No guarding or rebound.  No organosplenomegaly.  PELVIC:  Deferred to gynecology.  RECTAL:  Deferred to GI.  EXTREMITIES:  Without clubbing, cyanosis, edema, deformity.  NEUROLOGIC:  The patient is awake, alert, oriented to person, place, time,  and context.  Able to give the day and date.  She can name the President.  She can name current events.  The patient was able to repeat five digits in  the forward order, four digits in reverse order.  She could spell the word  world in reverse order.  The patient had trouble with serial 7's but she  was able to do simple calculation for purchase and change without  difficulty.  The patient did very poorly with parable demonstrating a lack  of abstract thinking ability.  She was able to recall two out of three words  at five minutes.  Cranial nerves II-XII were grossly intact with normal  facial symmetry and movement.  Extraocular muscles were intact.  DTRs were  2+ and symmetrical.  Motor strength was 5/5 and symmetrical throughout.  Toes were downgoing.  Sensation was well preserved.  Cerebellar function:  The patient had no tremor.  She had negative pronator drift.  She was not  stood.   DATABASE:  Hemoglobin 13.3, hematocrit 38.6, white count 7500 with a normal  differential, platelet count 231,000.  Chemistries with sodium 142,  potassium 3.8, chloride 106, CO2 26, BUN 8, creatinine 0.7, glucose 100,  calcium 9.7.  CK was negative at 40.  CT scan of the brain was unremarkable.   ASSESSMENT/PLAN:  1. Neurologic.  The patient with a recent episode of transient brief syncope     and then this morning transient loss of memory and disorientation.  This    is suggestive of  possible transient ischemic attack.  The patient has had     a history of palpitations, but had normal EKG at admission to the     emergency room with sinus rhythm and no abnormalities noted.  Would be     concerned for a possible arrhythmia versus cerebrovascular disease.  Plan     telemetry admission to rule out any arrhythmia.  Will get an MRI and MRA     to look for the extracranial vessels.  Will start the patient on aspirin     325 mg daily.  Will repeat neurologic examination in a.m.  2. Hypertension.  The patient is markedly hypertensive at admission.  She     evidently has had variable high blood pressure as an outpatient.  She was     prescribed Micardis, but discontinued taking this medication.  Will start     hydrochlorothiazide 12.5 mg daily.  Will use clonidine 0.1 mg q.6h.     p.r.n. systolic blood pressure greater than 150, diastolic pressure     greater than 100.  3. Hyperlipidemia.  The patient reports an outpatient cholesterol level of     235.  Will repeat lipid profile in hospital.  4.     Sinusitis.  The patient was treated for a sinus infection in December.  She      was seen four days ago by Colon Flattery, M.D. and was started on     amoxicillin for a recurrent sinus infection.  Of note, CT of the brain     showed sinuses to be normal.  The patient's white count is normal.  Will     continue a course of amoxicillin.                                               Rosalyn Gess Norins, M.D. [facility]    MEN/MEDQ  D:  12/18/2002  T:  12/18/2002  Job:  [id number]   cc:   Corwin Levins, M.D. [facility]  [address]  [place]  [place] [address]  Fax: [phone]

## 2011-03-09 NOTE — Discharge Summary (Signed)
NAMEHENA, Crystal [name]                      ACCOUNT NO.:  [id number]   MEDICAL RECORD NO.:  [id number]                   PATIENT TYPE:  INP   LOCATION:  0367                                 FACILITY:  [facility]   PHYSICIAN:  Rosalyn Gess. Norins, M.D. [facility]         DATE OF BIRTH:  02/15/1946   DATE OF ADMISSION:  12/18/2002  DATE OF DISCHARGE:  12/19/2002                                 DISCHARGE SUMMARY   HISTORY:  The patient is a 65 year old woman who was admitted because of a  history of transient loss of memory, and altered mental status.  Please see  the complete H&P at admission for details.   HOSPITAL COURSE:  The patient has done well.  She was on telemetry and had  no significant arrythmias.  The patient's vital signs remained stable.  She  had no recurrent episodes of mental status change, memory loss or other  neurologic deficits.   The patient had an MRI/MRA results of which are pending at the time of this  dictation.  However, if normal she will be discharged to home.   DISCHARGE EXAMINATION:  VITAL SIGNS:  The patient is afebrile at 98.8, blood  pressure 145/89, pulse 82, respirations 20.  GENERAL APPEARANCE:  This is slender woman in no acute distress.  HEENT:  Exam is unremarkable.  CARDIOVASCULAR:  2+radial pulses, precordium is quiet, she had a regular  rate and rhythm.  NEUROLOGIC:  The patient is awake, alert, oriented to person, place, time  and context.  Speech is clear.  Cranial nerves II-XII are grossly intact.  Motor strength was normal.   DATABASE:  MRI/MRA is pending at the time of discharge dictation.   LABORATORY DATA:  The patient had a lipid profile which revealed a total  cholesterol of 226, HDL 57, LDL 140.  This gives her an LDL/HDL ratio of  3.0.  Triglycerides were normal at 98.  The patient had no other repeat  laboratory other than what was done on admission.   DISPOSITION:  The patient is discharged to home in good shape, if her  MRI/MRA is  negative.   PROBLEM LIST:  1. Gastrointestinal:  The patient will continue on Zelnorm 6 mg daily, which     is a home medication.  2. Neurologic:  The patient with no abnormalities during her hospital     observation.  She is to follow up with Dr. Efrain Sella in regards to her     symptoms and need for any additional workup.  3. Hypertension:  The patient's blood pressure has been mildly elevated in     the hospital.  She was started on HCTZ 12.5 mg daily and will continue     the same.  This is to be monitored by the patient and Dr. Jonny Ruiz will     assist in adjusting her medications as needed.  4. Lipids:  The patient with mildly elevated LDL cholesterol, at 140,  with a     robust HDL.  I have encouraged the patient to follow life style     management being prudent in her use of fats.  She is also to exercise on     a regular     basis.  I believe that if she fails after 3-6 months to bring her LDL     down to a level of 130, she would be a candidate for medical     intervention, although her risk factors overall are low.   CONDITION ON DISCHARGE:  The patient's condition at the time of discharge  dictation is stable.                                               Rosalyn Gess Norins, M.D. [facility]    MEN/MEDQ  D:  12/19/2002  T:  12/19/2002  Job:  [id number]   cc:   Corwin Levins, M.D. [facility]

## 2011-05-27 ENCOUNTER — Other Ambulatory Visit: Payer: Self-pay | Admitting: Internal Medicine

## 2011-08-22 ENCOUNTER — Ambulatory Visit: Payer: Medicare Other

## 2011-08-22 ENCOUNTER — Ambulatory Visit (INDEPENDENT_AMBULATORY_CARE_PROVIDER_SITE_OTHER): Payer: Medicare Other

## 2011-08-22 DIAGNOSIS — Z23 Encounter for immunization: Secondary | ICD-10-CM

## 2011-08-22 NOTE — Progress Notes (Signed)
Addended by: Kristie Cowman on: 08/22/2011 10:52 AM   Modules accepted: Orders

## 2011-10-23 DIAGNOSIS — Z923 Personal history of irradiation: Secondary | ICD-10-CM

## 2011-10-23 HISTORY — PX: BREAST BIOPSY: SHX20

## 2011-10-23 HISTORY — DX: Personal history of irradiation: Z92.3

## 2011-10-23 HISTORY — PX: BREAST LUMPECTOMY: SHX2

## 2012-01-03 ENCOUNTER — Encounter: Payer: Self-pay | Admitting: Internal Medicine

## 2012-01-07 ENCOUNTER — Other Ambulatory Visit (INDEPENDENT_AMBULATORY_CARE_PROVIDER_SITE_OTHER): Payer: Medicare Other

## 2012-01-07 ENCOUNTER — Encounter: Payer: Self-pay | Admitting: Internal Medicine

## 2012-01-07 ENCOUNTER — Ambulatory Visit (INDEPENDENT_AMBULATORY_CARE_PROVIDER_SITE_OTHER): Payer: Medicare Other | Admitting: Internal Medicine

## 2012-01-07 VITALS — BP 122/82 | HR 94 | Temp 97.8°F | Resp 14 | Ht 62.0 in | Wt 108.2 lb

## 2012-01-07 DIAGNOSIS — M25559 Pain in unspecified hip: Secondary | ICD-10-CM

## 2012-01-07 DIAGNOSIS — E785 Hyperlipidemia, unspecified: Secondary | ICD-10-CM

## 2012-01-07 DIAGNOSIS — K644 Residual hemorrhoidal skin tags: Secondary | ICD-10-CM

## 2012-01-07 DIAGNOSIS — K219 Gastro-esophageal reflux disease without esophagitis: Secondary | ICD-10-CM | POA: Diagnosis not present

## 2012-01-07 DIAGNOSIS — I10 Essential (primary) hypertension: Secondary | ICD-10-CM

## 2012-01-07 DIAGNOSIS — I679 Cerebrovascular disease, unspecified: Secondary | ICD-10-CM

## 2012-01-07 DIAGNOSIS — K5900 Constipation, unspecified: Secondary | ICD-10-CM

## 2012-01-07 DIAGNOSIS — Z0000 Encounter for general adult medical examination without abnormal findings: Secondary | ICD-10-CM | POA: Diagnosis not present

## 2012-01-07 LAB — HEPATIC FUNCTION PANEL
ALT: 19 U/L (ref 0–35)
AST: 20 U/L (ref 0–37)
Total Bilirubin: 1.2 mg/dL (ref 0.3–1.2)
Total Protein: 7.3 g/dL (ref 6.0–8.3)

## 2012-01-07 LAB — LIPID PANEL
HDL: 76.1 mg/dL (ref >39.00)
Total CHOL/HDL Ratio: 3
Triglycerides: 93 mg/dL (ref 0.0–149.0)
VLDL: 18.6 mg/dL (ref 0.0–40.0)

## 2012-01-07 LAB — SEDIMENTATION RATE: Sed Rate: 9 mm/hr (ref 0–22)

## 2012-01-07 LAB — CBC WITH DIFFERENTIAL/PLATELET
Basophils Relative: 0.5 % (ref 0.0–3.0)
Eosinophils Absolute: 0.1 10*3/uL (ref 0.0–0.7)
Eosinophils Relative: 1.2 % (ref 0.0–5.0)
Hemoglobin: 13.2 g/dL (ref 12.0–15.0)
Lymphocytes Relative: 15.6 % (ref 12.0–46.0)
Monocytes Relative: 4.8 % (ref 3.0–12.0)
Neutro Abs: 6.2 10*3/uL (ref 1.4–7.7)
Neutrophils Relative %: 77.9 % — ABNORMAL HIGH (ref 43.0–77.0)
RBC: 4.14 Mil/uL (ref 3.87–5.11)
WBC: 8 10*3/uL (ref 4.5–10.5)

## 2012-01-07 LAB — COMPREHENSIVE METABOLIC PANEL
AST: 20 U/L (ref 0–37)
Alkaline Phosphatase: 85 U/L (ref 39–117)
Glucose, Bld: 97 mg/dL (ref 70–99)
Sodium: 143 mEq/L (ref 135–145)
Total Bilirubin: 1.2 mg/dL (ref 0.3–1.2)
Total Protein: 7.3 g/dL (ref 6.0–8.3)

## 2012-01-07 NOTE — Progress Notes (Signed)
Subjective:    Patient ID: Crystal [name], female    DOB: [birth year], 66 y.o.   MRN: [id number]  HPI The patient is here for annual Medicare wellness examination and management of other chronic and acute problems. Very aware of a "wobblying" in the substernal area - not painful, no limitation in activities. It will be spontaneous- not exertional in nature. Happening of the past 8 months.   The risk factors are reflected in the social history.  The roster of all physicians providing medical care to patient - is listed in the Snapshot section of the chart.  Activities of daily living:  The patient is 100% inedpendent in all ADLs: dressing, toileting, feeding as well as independent mobility  Home safety : The patient has smoke detectors in the home. Fall risk - no grab bars, has made the home fall safe. They wear seatbelts. No firearms at home   There is no risks for hepatitis, STDs or HIV. There is no  history of blood transfusion. They have no travel history to infectious disease endemic areas of the world.  The patient has seen their dentist in the last six month. They have  seen their eye doctor in the last year-next in April '13.. They deny any hearing difficulty and have not had audiologic testing in the last year.  They do not  have excessive sun exposure. Discussed the need for sun protection: hats, long sleeves and use of sunscreen if there is significant sun exposure.   Diet: the importance of a healthy diet is discussed. They do eat out a lot at :[place].  The patient has no  regular exercise program.  The benefits of regular aerobic exercise were discussed.  Depression screen: there are no signs or vegative symptoms of depression- irritability, change in appetite, anhedonia, sadness/tearfullness.  Cognitive assessment: the patient manages all their financial and personal affairs and is actively engaged. They could relate day,date,year and events; recalled 3/3  objects at 3 minutes; performed clock-face test normally.  The following portions of the patient's history were reviewed and updated as appropriate: allergies, current medications, past family history, past medical history,  past surgical history, past social history  and problem list.  Vision, hearing, body mass index were assessed and reviewed.   During the course of the visit the patient was educated and counseled about appropriate screening and preventive services including : fall prevention , diabetes screening, nutrition counseling, colorectal cancer screening, and recommended immunizations.  Past Medical History  Diagnosis Date  . Abdominal aortic ectasia 01/31/2010    Qualifier: Diagnosis of  By: Debby Bud MD, Rosalyn Gess ABDOMINAL PAIN RIGHT UPPER QUADRANT 11/07/2010    Qualifier: Diagnosis of  By: Jarold Motto MD Lang Snow CEREBROVASCULAR DISEASE 06/16/2010    Qualifier: Diagnosis of  By: Jonny Ruiz MD, Len Blalock   . CONSTIPATION 01/12/2009    Qualifier: Diagnosis of  By: Koleen Distance CMA (AAMA), Hulan Saas    . Cough 08/15/2010    Qualifier: Diagnosis of  By: Jarold Motto MD Lang Snow DETRUSOR, OVERACTIVE 03/02/2010    Qualifier: Diagnosis of  By: Debby Bud MD, Rosalyn Gess ESOPHAGEAL STRICTURE 01/12/2009    Qualifier: Diagnosis of  By: Koleen Distance CMA (AAMA), Hulan Saas    . EXTERNAL HEMORRHOIDS 01/12/2009    Qualifier: Diagnosis of  By: Koleen Distance CMA (AAMA), Hulan Saas    . GERD 05/10/2007    Qualifier: Diagnosis of  By: Charlsie Quest RMA, Lucy    .  HYPERLIPIDEMIA 05/10/2007    Qualifier: Diagnosis of  By: Tyrone Apple, Lucy    . HYPERTENSION 05/10/2007    Qualifier: Diagnosis of  By: Tyrone Apple, Lucy    . HYPOTENSION, ORTHOSTATIC 06/16/2010    Qualifier: Diagnosis of  By: Jonny Ruiz MD, Len Blalock   . Irritable bowel syndrome 01/12/2009    Qualifier: Diagnosis of  By: Koleen Distance CMA (AAMA), Hulan Saas    . MELANOSIS COLI 02/16/2009    Qualifier: Diagnosis of  By: Koleen Distance CMA (AAMA), Hulan Saas    . PERIPHERAL VASCULAR DISEASE 06/17/2010      Qualifier: Diagnosis of  By: Jonny Ruiz MD, Len Blalock   . RECTAL FISSURE 02/16/2009    Qualifier: Diagnosis of  By: Koleen Distance CMA (AAMA), Hulan Saas    . STENOSIS, RECTAL 02/16/2009    Qualifier: Diagnosis of  By: Koleen Distance CMA (AAMA), Hulan Saas    . SYNCOPE 04/19/2008    Qualifier: Diagnosis of  By: Debby Bud MD, Rosalyn Gess   . TACHYARRHYTHMIA 09/03/2009    Qualifier: Diagnosis of  By: Debby Bud MD, Rosalyn Gess TRANSIENT ISCHEMIC ATTACK 03/02/2010    Qualifier: Diagnosis of  By: Debby Bud MD, Rosalyn Gess VENOUS INSUFFICIENCY, LEGS 07/18/2010    Qualifier: Diagnosis of  By: Debby Bud MD, Rosalyn Gess   . Eosinophilic esophagitis   . Dysphagia   . Orthostatic hypotension   . Overactive detrusor   . Dizziness and giddiness   . Sinusitis acute   . Personal history of other diseases of digestive system   . Anal or rectal pain   . Other malaise and fatigue   . Adjustment disorder with depressed mood   . Routine general medical examination at a health care facility    Past Surgical History  Procedure Date  . Appendectomy   . Abdominal hysterectomy   . Breast surgery     left breast x 2  . Hemmorhoidectomy   . Cardiac catherization   . Esophageal tumor excised; posterior 1986   . Electrocardiogram 10/30/2006  . Edg 04/21/2007   Family History  Problem Relation Age of Onset  . Bone cancer Father   . Heart disease Brother   . Colon cancer Neg Hx   . Breast cancer Neg Hx   . Diabetes Neg Hx    History   Social History  . Marital Status: Widowed    Spouse Name: N/A    Number of Children: N/A  . Years of Education: N/A   Occupational History  . Not on file.   Social History Main Topics  . Smoking status: Former Games developer  . Smokeless tobacco: Not on file  . Alcohol Use: Yes  . Drug Use:   . Sexually Active:    Other Topics Concern  . Not on file   Social History Narrative   HSGMarried '83-  Widowed '081 son - '76; no grandchildrenRetired, Lives alone. I-ADL'sEnd of Life: No resuscitation, no  mechanical ventilation, no futile heroic measures.Patient is a former smoker, quit 40 years ago.Alcohol use-yes occ wineDaily caffeine use 2 cups coffee / dayIllicit drug use-noPatient does not get regular exercise.        Review of Systems Constitutional:  Negative for fever, chills, activity change and unexpected weight change.  HEENT:  Negative for hearing loss, ear pain, congestion, neck stiffness and postnasal drip. Negative for sore throat or swallowing problems. Negative for dental complaints.   Eyes: Negative for vision loss or change in visual acuity.  Respiratory: Negative for chest tightness and wheezing.  Negative for DOE.   Cardiovascular: Negative for chest pain or palpitations. No decreased exercise tolerance Gastrointestinal: No change in bowel habit. No bloating or gas. No reflux or indigestion Genitourinary: Negative for urgency, frequency, flank pain and difficulty urinating. Occasional overflow incontinence. Musculoskeletal: Negative for myalgias, back pain and gait problem. Right hip pain with weight bearing and work.  Neurological: Negative for dizziness, tremors, weakness and headaches.  Hematological: Negative for adenopathy.  Psychiatric/Behavioral: Negative for behavioral problems and dysphoric mood.       Objective:   Physical Exam Filed Vitals:   01/07/12 0902  BP: 122/82  Pulse: 94  Temp: 97.8 F (36.6 C)  Resp: 14   Gen'l: well nourished, well developed white woman in no distress HEENT - [place]/AT, EACs/TMs normal, oropharynx with native dentition in good condition but missing several molars, no buccal or palatal lesions, posterior pharynx clear, mucous membranes moist. C&S clear, PERRLA, fundi - normal Neck - supple, no thyromegaly Nodes- negative submental, cervical, supraclavicular regions Chest - no deformity, no CVAT Lungs - cleat without rales, wheezes. No increased work of breathing Breast - deferred to gyn Cardiovascular - regular rate and  rhythm, quiet precordium, no murmurs, rubs or gallops, 2+ radial, DP and PT pulses Abdomen - BS+ x 4, no HSM, no guarding or rebound or tenderness Pelvic - deferred to gyn Rectal - deferred to gyn Extremities - no clubbing, cyanosis, edema or deformity.  Neuro - A&O x 3, CN II-XII normal, motor strength normal and equal, DTRs 2+ and symmetrical biceps, radial, and patellar tendons. Cerebellar - no tremor, no rigidity, fluid movement and normal gait. Derm - Head, neck, back, abdomen and extremities without suspicious lesions  Lab Results  Component Value Date   WBC 8.0 01/07/2012   HGB 13.2 01/07/2012   HCT 39.2 01/07/2012   PLT 232.0 01/07/2012   GLUCOSE 97 01/07/2012   CHOL 216* 01/07/2012   TRIG 93.0 01/07/2012   HDL 76.10 01/07/2012   LDLDIRECT 128.5 01/07/2012        ALT 19 01/07/2012   AST 20 01/07/2012        NA 143 01/07/2012   K 4.9 01/07/2012   CL 106 01/07/2012   CREATININE 0.7 01/07/2012   BUN 16 01/07/2012   CO2 31 01/07/2012   TSH 1.67 11/07/2010          Assessment & Plan:

## 2012-01-08 NOTE — Assessment & Plan Note (Signed)
BP Readings from Last 3 Encounters:  01/07/12 122/82  01/04/11 146/90  12/07/10 128/72   Good control of BP. Will continue low dose norvasc.

## 2012-01-08 NOTE — Assessment & Plan Note (Signed)
LDL is down from last test, 144, and HDL remains very robust. No indication for medical therapy. Better diet may help.

## 2012-01-08 NOTE — Assessment & Plan Note (Signed)
Has been seen in GI and is now on a regimen that has regulated her bowels. No changes proposed.

## 2012-01-08 NOTE — Assessment & Plan Note (Signed)
Interval history is stable w/ improvement in GI issues. Physical exam, sans breast and pelvic, is normal. She remains current with her gynecologist. Last colonoscopy March '10 - good to '15. Immunizations are up to date except for shingles vaccine. She remains current with mammography although last report on record is '11.  In summary - a nice woman who is medically stable. She knows that she could improve her diet. She will return as needed or in 1 year.

## 2012-01-08 NOTE — Assessment & Plan Note (Signed)
Stable with no symptoms 

## 2012-01-16 ENCOUNTER — Other Ambulatory Visit: Payer: Self-pay | Admitting: Obstetrics and Gynecology

## 2012-01-16 DIAGNOSIS — N6314 Unspecified lump in the right breast, lower inner quadrant: Secondary | ICD-10-CM

## 2012-01-16 DIAGNOSIS — Z0000 Encounter for general adult medical examination without abnormal findings: Secondary | ICD-10-CM | POA: Diagnosis not present

## 2012-01-16 DIAGNOSIS — Z124 Encounter for screening for malignant neoplasm of cervix: Secondary | ICD-10-CM | POA: Diagnosis not present

## 2012-01-23 ENCOUNTER — Other Ambulatory Visit: Payer: Medicare Other

## 2012-01-29 ENCOUNTER — Other Ambulatory Visit: Payer: Self-pay | Admitting: Obstetrics and Gynecology

## 2012-01-29 ENCOUNTER — Ambulatory Visit
Admission: RE | Admit: 2012-01-29 | Discharge: 2012-01-29 | Disposition: A | Discharge status: Home / Self Care | Payer: Medicare Other | Source: Ambulatory Visit | Attending: Obstetrics and Gynecology | Admitting: Obstetrics and Gynecology

## 2012-01-29 ENCOUNTER — Other Ambulatory Visit: Payer: Self-pay | Admitting: Radiology

## 2012-01-29 DIAGNOSIS — N6314 Unspecified lump in the right breast, lower inner quadrant: Secondary | ICD-10-CM

## 2012-01-29 DIAGNOSIS — N63 Unspecified lump in unspecified breast: Secondary | ICD-10-CM | POA: Diagnosis not present

## 2012-01-29 DIAGNOSIS — C50919 Malignant neoplasm of unspecified site of unspecified female breast: Secondary | ICD-10-CM | POA: Diagnosis not present

## 2012-01-30 ENCOUNTER — Other Ambulatory Visit: Payer: Self-pay | Admitting: Obstetrics and Gynecology

## 2012-01-30 ENCOUNTER — Ambulatory Visit
Admission: RE | Admit: 2012-01-30 | Discharge: 2012-01-30 | Disposition: A | Discharge status: Home / Self Care | Payer: Medicare Other | Source: Ambulatory Visit | Attending: Obstetrics and Gynecology | Admitting: Obstetrics and Gynecology

## 2012-01-30 DIAGNOSIS — H25019 Cortical age-related cataract, unspecified eye: Secondary | ICD-10-CM | POA: Diagnosis not present

## 2012-01-30 DIAGNOSIS — N63 Unspecified lump in unspecified breast: Secondary | ICD-10-CM | POA: Diagnosis not present

## 2012-01-30 DIAGNOSIS — C50919 Malignant neoplasm of unspecified site of unspecified female breast: Secondary | ICD-10-CM | POA: Diagnosis not present

## 2012-01-30 DIAGNOSIS — N6489 Other specified disorders of breast: Secondary | ICD-10-CM

## 2012-01-30 DIAGNOSIS — C50911 Malignant neoplasm of unspecified site of right female breast: Secondary | ICD-10-CM

## 2012-01-30 DIAGNOSIS — N6314 Unspecified lump in the right breast, lower inner quadrant: Secondary | ICD-10-CM

## 2012-01-31 ENCOUNTER — Telehealth: Payer: Self-pay | Admitting: *Deleted

## 2012-01-31 ENCOUNTER — Other Ambulatory Visit: Payer: Self-pay | Admitting: *Deleted

## 2012-01-31 DIAGNOSIS — C50319 Malignant neoplasm of lower-inner quadrant of unspecified female breast: Secondary | ICD-10-CM

## 2012-01-31 NOTE — Telephone Encounter (Signed)
Confirmed [facility] for 02/06/12 at 1200.  Instructions and contact information given.  

## 2012-02-04 ENCOUNTER — Ambulatory Visit
Admission: RE | Admit: 2012-02-04 | Discharge: 2012-02-04 | Disposition: A | Discharge status: Home / Self Care | Payer: Medicare Other | Source: Ambulatory Visit | Attending: Obstetrics and Gynecology | Admitting: Obstetrics and Gynecology

## 2012-02-04 DIAGNOSIS — C50919 Malignant neoplasm of unspecified site of unspecified female breast: Secondary | ICD-10-CM | POA: Diagnosis not present

## 2012-02-04 DIAGNOSIS — C50911 Malignant neoplasm of unspecified site of right female breast: Secondary | ICD-10-CM

## 2012-02-04 MED ORDER — GADOBENATE DIMEGLUMINE 529 MG/ML IV SOLN
10.0000 mL | Freq: Once | INTRAVENOUS | Status: AC | PRN
  Administered 2012-02-04: 10 mL via INTRAVENOUS

## 2012-02-06 ENCOUNTER — Other Ambulatory Visit (HOSPITAL_BASED_OUTPATIENT_CLINIC_OR_DEPARTMENT_OTHER): Payer: Medicare Other | Admitting: Lab

## 2012-02-06 ENCOUNTER — Ambulatory Visit (HOSPITAL_BASED_OUTPATIENT_CLINIC_OR_DEPARTMENT_OTHER): Payer: Medicare Other | Admitting: Oncology

## 2012-02-06 ENCOUNTER — Encounter (INDEPENDENT_AMBULATORY_CARE_PROVIDER_SITE_OTHER): Payer: Self-pay | Admitting: General Surgery

## 2012-02-06 ENCOUNTER — Encounter: Payer: Self-pay | Admitting: *Deleted

## 2012-02-06 ENCOUNTER — Ambulatory Visit (HOSPITAL_BASED_OUTPATIENT_CLINIC_OR_DEPARTMENT_OTHER): Payer: Medicare Other | Admitting: General Surgery

## 2012-02-06 ENCOUNTER — Ambulatory Visit: Payer: Medicare Other

## 2012-02-06 ENCOUNTER — Ambulatory Visit
Admission: RE | Admit: 2012-02-06 | Discharge: 2012-02-06 | Disposition: A | Discharge status: Home / Self Care | Payer: Medicare Other | Source: Ambulatory Visit | Attending: Radiation Oncology | Admitting: Radiation Oncology

## 2012-02-06 ENCOUNTER — Telehealth: Payer: Self-pay | Admitting: *Deleted

## 2012-02-06 ENCOUNTER — Encounter: Payer: Self-pay | Admitting: Oncology

## 2012-02-06 ENCOUNTER — Ambulatory Visit: Payer: Medicare Other | Attending: General Surgery | Admitting: Physical Therapy

## 2012-02-06 VITALS — BP 162/91 | HR 92 | Temp 98.8°F | Ht 61.5 in | Wt 109.1 lb

## 2012-02-06 DIAGNOSIS — IMO0001 Reserved for inherently not codable concepts without codable children: Secondary | ICD-10-CM | POA: Insufficient documentation

## 2012-02-06 DIAGNOSIS — C50319 Malignant neoplasm of lower-inner quadrant of unspecified female breast: Secondary | ICD-10-CM

## 2012-02-06 DIAGNOSIS — C50919 Malignant neoplasm of unspecified site of unspecified female breast: Secondary | ICD-10-CM

## 2012-02-06 DIAGNOSIS — Z170 Estrogen receptor positive status [ER+]: Secondary | ICD-10-CM | POA: Diagnosis not present

## 2012-02-06 DIAGNOSIS — C50911 Malignant neoplasm of unspecified site of right female breast: Secondary | ICD-10-CM

## 2012-02-06 DIAGNOSIS — R293 Abnormal posture: Secondary | ICD-10-CM | POA: Insufficient documentation

## 2012-02-06 LAB — CBC WITH DIFFERENTIAL/PLATELET
Basophils Absolute: 0 10*3/uL (ref 0.0–0.1)
EOS%: 0.5 % (ref 0.0–7.0)
HCT: 36.5 % (ref 34.8–46.6)
HGB: 12.3 g/dL (ref 11.6–15.9)
MCH: 31.7 pg (ref 25.1–34.0)
MCV: 93.9 fL (ref 79.5–101.0)
MONO%: 5.8 % (ref 0.0–14.0)
NEUT%: 75.6 % (ref 38.4–76.8)
RDW: 12.5 % (ref 11.2–14.5)

## 2012-02-06 LAB — COMPREHENSIVE METABOLIC PANEL
Alkaline Phosphatase: 93 U/L (ref 39–117)
BUN: 13 mg/dL (ref 6–23)
Glucose, Bld: 95 mg/dL (ref 70–99)
Total Bilirubin: 1 mg/dL (ref 0.3–1.2)

## 2012-02-06 NOTE — Telephone Encounter (Signed)
gave patient appointment for 03-26-2012 at 11:00am for an hour appointment printed out calendar and gave to the patient

## 2012-02-06 NOTE — Progress Notes (Signed)
Mailed after appt letter to pt. 

## 2012-02-06 NOTE — Progress Notes (Signed)
Patient ID: Crystal [name], female   DOB: 19-Oct-1946, 66 y.o.   MRN: [id number]  No chief complaint on file.   HPI Crystal [name] is a 66 y.o. female.  This patient is seen in our multidisciplinary breast clinic for evaluation of a newly diagnosed invasive right breast cancer. She first noticed some dimpling on the inferior portion of her right breast while raising her arms but she could not feel any O. Masses. She presented to her physician and she had a followup mammogram, ultrasound and MRI which demonstrated a concerning lesion at the inferior medial portion of her right breast and biopsy was performed which demonstrated invasive breast cancer which was ER and PR positive and HER-2/neu negative. She says that she does do self breast exam and has not noticed any suspicious lesions. She denies any systemic symptoms such as headaches, bony pains, or weight loss. She does have a history of 2 benign breast tumors which were excised from her left breast. She does have a history of TIA as well in 2004 as well as some shortness of breath with activity. HPI  Past Medical History  Diagnosis Date  . Abdominal aortic ectasia 01/31/2010    Qualifier: Diagnosis of  By: Debby Bud MD, Rosalyn Gess ABDOMINAL PAIN RIGHT UPPER QUADRANT 11/07/2010    Qualifier: Diagnosis of  By: Jarold Motto MD Lang Snow CEREBROVASCULAR DISEASE 06/16/2010    Qualifier: Diagnosis of  By: Jonny Ruiz MD, Len Blalock   . CONSTIPATION 01/12/2009    Qualifier: Diagnosis of  By: Koleen Distance CMA (AAMA), Hulan Saas    . Cough 08/15/2010    Qualifier: Diagnosis of  By: Jarold Motto MD Lang Snow DETRUSOR, OVERACTIVE 03/02/2010    Qualifier: Diagnosis of  By: Debby Bud MD, Rosalyn Gess ESOPHAGEAL STRICTURE 01/12/2009    Qualifier: Diagnosis of  By: Koleen Distance CMA (AAMA), Hulan Saas    . EXTERNAL HEMORRHOIDS 01/12/2009    Qualifier: Diagnosis of  By: Koleen Distance CMA (AAMA), Hulan Saas    . GERD 05/10/2007    Qualifier: Diagnosis of  By: Charlsie Quest RMA, Lucy    .  HYPERLIPIDEMIA 05/10/2007    Qualifier: Diagnosis of  By: Tyrone Apple, Lucy    . HYPERTENSION 05/10/2007    Qualifier: Diagnosis of  By: Tyrone Apple, Lucy    . HYPOTENSION, ORTHOSTATIC 06/16/2010    Qualifier: Diagnosis of  By: Jonny Ruiz MD, Len Blalock   . Irritable bowel syndrome 01/12/2009    Qualifier: Diagnosis of  By: Koleen Distance CMA (AAMA), Hulan Saas    . MELANOSIS COLI 02/16/2009    Qualifier: Diagnosis of  By: Koleen Distance CMA (AAMA), Hulan Saas    . PERIPHERAL VASCULAR DISEASE 06/17/2010    Qualifier: Diagnosis of  By: Jonny Ruiz MD, Len Blalock   . RECTAL FISSURE 02/16/2009    Qualifier: Diagnosis of  By: Koleen Distance CMA (AAMA), Hulan Saas    . STENOSIS, RECTAL 02/16/2009    Qualifier: Diagnosis of  By: Koleen Distance CMA (AAMA), Hulan Saas    . SYNCOPE 04/19/2008    Qualifier: Diagnosis of  By: Debby Bud MD, Rosalyn Gess   . TACHYARRHYTHMIA 09/03/2009    Qualifier: Diagnosis of  By: Debby Bud MD, Rosalyn Gess TRANSIENT ISCHEMIC ATTACK 03/02/2010    Qualifier: Diagnosis of  By: Debby Bud MD, Rosalyn Gess VENOUS INSUFFICIENCY, LEGS 07/18/2010    Qualifier: Diagnosis of  By: Debby Bud MD, Rosalyn Gess   . Eosinophilic esophagitis   . Dysphagia   . Orthostatic hypotension   .  Overactive detrusor   . Dizziness and giddiness   . Sinusitis acute   . Personal history of other diseases of digestive system   . Anal or rectal pain   . Other malaise and fatigue   . Adjustment disorder with depressed mood   . Routine general medical examination at a health care facility     Past Surgical History  Procedure Date  . Appendectomy   . Abdominal hysterectomy   . Breast surgery     left breast x 2  . Hemmorhoidectomy   . Cardiac catherization   . Esophageal tumor excised; posterior 1986   . Electrocardiogram 10/30/2006  . Edg 04/21/2007    Family History  Problem Relation Age of Onset  . Bone cancer Father   . Cancer Father     Bone  . Heart disease Brother   . Colon cancer Neg Hx   . Breast cancer Neg Hx   . Diabetes Neg Hx     Social History History    Substance Use Topics  . Smoking status: Former Smoker    Quit date: 10/22/1965  . Smokeless tobacco: Not on file  . Alcohol Use: 1.8 oz/week    3 Glasses of wine per week    No Known Allergies  Current Outpatient Prescriptions  Medication Sig Dispense Refill  . amLODipine (NORVASC) 5 MG tablet TAKE ONE TABLET BY MOUTH ONE TIME DAILY  30 tablet  8  . aspirin 325 MG EC tablet Take 325 mg by mouth daily.      . lansoprazole (PREVACID) 30 MG capsule Take 30 mg by mouth daily.      . mesalamine (CANASA) 1000 MG suppository Place 1,000 mg rectally at bedtime as needed.       . metoCLOPramide (REGLAN) 10 MG/10ML SOLN Take 10 mg by mouth.      . polyethylene glycol (MIRALAX / GLYCOLAX) packet Take 17 g by mouth daily.      . Sennosides (PERDIEM OVERNIGHT RELIEF) 15 MG TABS Take 30 mg by mouth at bedtime as needed.        Review of Systems Review of Systems All other review of systems negative or noncontributory except as stated in the HPI  There were no vitals taken for this visit.  Physical Exam Physical Exam Wt Readings from Last 3 Encounters:  02/06/12 109 lb 1.6 oz (49.487 kg)  01/07/12 108 lb 4 oz (49.102 kg)  01/04/11 107 lb (48.535 kg)   Temp Readings from Last 3 Encounters:  02/06/12 98.8 F (37.1 C) Oral  01/07/12 97.8 F (36.6 C) Oral   BP Readings from Last 3 Encounters:  02/06/12 162/91  01/07/12 122/82  01/04/11 146/90   Pulse Readings from Last 3 Encounters:  02/06/12 92  01/07/12 94  01/04/11 78  Physical Exam  Nursing note and vitals reviewed. Constitutional: She is oriented to person, place, and time. She appears well-developed and well-nourished. No distress.  HENT:  Head: Normocephalic and atraumatic.  Mouth/Throat: No oropharyngeal exudate.  Eyes: Conjunctivae and EOM are normal. Pupils are equal, round, and reactive to light. Right eye exhibits no discharge. Left eye exhibits no discharge. No scleral icterus.  Neck: Normal range of motion. Neck  supple. No tracheal deviation present.  Cardiovascular: Normal rate, regular rhythm, normal heart sounds and intact distal pulses.   Pulmonary/Chest: Effort normal and breath sounds normal. No stridor. No respiratory distress. She has no wheezes.  Abdominal: Soft. Bowel sounds are normal. She exhibits no distension and no  mass. There is no tenderness. There is no rebound and no guarding.  Musculoskeletal: Normal range of motion. She exhibits no edema and no tenderness.  Neurological: She is alert and oriented to person, place, and time.  Skin: Skin is warm and dry. No rash noted. She is not diaphoretic. No erythema. No pallor.  Psychiatric: She has a normal mood and affect. Her behavior is normal. Judgment and thought content normal.  Breasts: the right breast she does have some dimpling at the 5:00 position of her breast near the inframammary crease. She also has a palpable mass at the 5:00 position which measures approximately 3 cm wide by 2 cm tall with a well-healed biopsy tract in the medial aspect. I do not appreciate any suspicious lymphadenopathy or skin changes on the right. Her left breast is normal on exam with no significant skin changes or palpable masses or lymphadenopathy  Data Reviewed Ultrasound, mammogram, MRI, pathology results  Assessment    Right invasive breast cancer Along discussion with the patient regarding the surgical options including breast conservation therapy versus mastectomy. We discussed the local recurrence risks and survival benefits of each procedure as well as the risks and benefits of each. She is leaning towards breast conservation therapy to include right-sided needle localized lumpectomy with sentinel lymph node biopsy followed by radiation therapy. We discussed the risks of possible need for a completion and axillary dissection or reexcision of margins. We also discussed the risk of infection, bleeding, pain, scarring, recurrence, poor cosmesis, nerve  injury, and lymphedema and she expressed understanding and would like to proceed with needle localized right-sided lumpectomy with sentinel lymph node biopsy. spent counseling this patient.    Plan    We will set her up for surgery as soon as possible and she will also meet with her oncologist Dr. Darnelle Catalan and the radiation oncologist Dr. Mitzi Hansen to discuss adjuvant therapies. Will also request surgical evaluation/clearance with Dr. Arthur Holms.       Lodema Pilot DAVID 02/06/2012, 3:13 PM

## 2012-02-07 ENCOUNTER — Telehealth: Payer: Self-pay | Admitting: *Deleted

## 2012-02-07 NOTE — Telephone Encounter (Signed)
Patient is awaiting surgery date to be set w/Dr Lodema Pilot at [facility] [address] [place]; needs order to stop ASA 325 mg 7 days prior to surgery [breast cancer]. Please advise [in Dr. Debby Bud' absence].

## 2012-02-07 NOTE — Telephone Encounter (Signed)
ok 

## 2012-02-08 NOTE — Telephone Encounter (Signed)
Patient notified and Kristi at Dr. Lodema Pilot office notified [phone])

## 2012-02-10 ENCOUNTER — Other Ambulatory Visit: Payer: Self-pay | Admitting: Oncology

## 2012-02-10 NOTE — Progress Notes (Signed)
ID: Crystal [name]   DOB: 03-01-1946  MR#: [id number]  CSN#:[id number]  HISTORY OF PRESENT ILLNESS: The patient saw Dr. Huel Cote for routine gynecologic followup and she noted a palpable mass in the right breast she said [name] up for right mammography and ultrasonography, performed at the breast center 01/29/2012. The breasts were heterogeneously dense. It mass with dense calcification was noted centrally in the right breast consistent with a benign fibroadenoma. The new palpable mass was not well demonstrated by mammography. It was firm, mobile, and measured approximately 1 cm by palpation. Ultrasound showed an irregularly marginated hypoechoic mass in this area, measuring 1.1 cm.  Biopsy was performed the same day, and showed [phone]) an invasive lobular breast cancer, e-cadherin negative,grade 1, estrogen and progesterone receptor positive, both of 100%, with an MIB-1 of 14%, and no HER-2 amplification.  The patient underwent a left diagnostic mammography 01/30/2012 and bilateral breast MRI 0415/2013.there were no other areas of concern in either breast, and there was no adenopathy noted. By MRI of the right breast mass measured 1.4 cm.  INTERVAL HISTORY: The patient tolerated her biopsies without unusual complications. She was seen at the multidisciplinary breast cancer clinic 02/06/2012 for definitive treatment plan.  REVIEW OF SYSTEMS: She complains of frequent chills, insomnia, mild fatigue, which does not affect her activities of daily living, some changes in her vision a related to changes in her prescription, chronic ringing in your ears, frequent runny nose and sinus problems this time of year, some difficulty swallowing and hoarseness at times, a dry cough associated with her sinus symptoms, poor appetite, nausea, vomiting, history of constipation, history of urinary leakage, easy bruising, low back pain, forgetfulness, anxiety, and a history of fainting. None of these  symptoms are new, none of them are more frequent or intense than previously. A detailed review of systems was otherwise noncontributory  PAST MEDICAL HISTORY: Past Medical History  Diagnosis Date  . Abdominal aortic ectasia 01/31/2010    Qualifier: Diagnosis of  By: Debby Bud MD, Rosalyn Gess ABDOMINAL PAIN RIGHT UPPER QUADRANT 11/07/2010    Qualifier: Diagnosis of  By: Jarold Motto MD Lang Snow CEREBROVASCULAR DISEASE 06/16/2010    Qualifier: Diagnosis of  By: Jonny Ruiz MD, Len Blalock   . CONSTIPATION 01/12/2009    Qualifier: Diagnosis of  By: Koleen Distance CMA (AAMA), Hulan Saas    . Cough 08/15/2010    Qualifier: Diagnosis of  By: Jarold Motto MD Lang Snow DETRUSOR, OVERACTIVE 03/02/2010    Qualifier: Diagnosis of  By: Debby Bud MD, Rosalyn Gess ESOPHAGEAL STRICTURE 01/12/2009    Qualifier: Diagnosis of  By: Koleen Distance CMA (AAMA), Hulan Saas    . EXTERNAL HEMORRHOIDS 01/12/2009    Qualifier: Diagnosis of  By: Koleen Distance CMA (AAMA), Hulan Saas    . GERD 05/10/2007    Qualifier: Diagnosis of  By: Charlsie Quest RMA, Lucy    . HYPERLIPIDEMIA 05/10/2007    Qualifier: Diagnosis of  By: Tyrone Apple, Lucy    . HYPERTENSION 05/10/2007    Qualifier: Diagnosis of  By: Tyrone Apple, Lucy    . HYPOTENSION, ORTHOSTATIC 06/16/2010    Qualifier: Diagnosis of  By: Jonny Ruiz MD, Len Blalock   . Irritable bowel syndrome 01/12/2009    Qualifier: Diagnosis of  By: Koleen Distance CMA (AAMA), Hulan Saas    . MELANOSIS COLI 02/16/2009    Qualifier: Diagnosis of  By: Koleen Distance CMA (AAMA), Hulan Saas    . PERIPHERAL VASCULAR DISEASE 06/17/2010    Qualifier: Diagnosis  of  By: Jonny Ruiz MD, Len Blalock   . RECTAL FISSURE 02/16/2009    Qualifier: Diagnosis of  By: Koleen Distance CMA (AAMA), Hulan Saas    . STENOSIS, RECTAL 02/16/2009    Qualifier: Diagnosis of  By: Koleen Distance CMA (AAMA), Hulan Saas    . SYNCOPE 04/19/2008    Qualifier: Diagnosis of  By: Debby Bud MD, Rosalyn Gess   . TACHYARRHYTHMIA 09/03/2009    Qualifier: Diagnosis of  By: Debby Bud MD, Rosalyn Gess TRANSIENT ISCHEMIC ATTACK 03/02/2010    Qualifier:  Diagnosis of  By: Debby Bud MD, Rosalyn Gess VENOUS INSUFFICIENCY, LEGS 07/18/2010    Qualifier: Diagnosis of  By: Debby Bud MD, Rosalyn Gess   . Eosinophilic esophagitis   . Dysphagia   . Orthostatic hypotension   . Overactive detrusor   . Dizziness and giddiness   . Sinusitis acute   . Personal history of other diseases of digestive system   . Anal or rectal pain   . Other malaise and fatigue   . Adjustment disorder with depressed mood   . Routine general medical examination at a health care facility     PAST SURGICAL HISTORY: Past Surgical History  Procedure Date  . Appendectomy   . Abdominal hysterectomy   . Breast surgery     left breast x 2  . Hemmorhoidectomy   . Cardiac catherization   . Esophageal tumor excised; posterior 1986   . Electrocardiogram 10/30/2006  . Edg 04/21/2007    FAMILY HISTORY Family History  Problem Relation Age of Onset  . Bone cancer Father   . Cancer Father     Bone  . Heart disease Brother   . Colon cancer Neg Hx   . Breast cancer Neg Hx   . Diabetes Neg Hx   The patient's father died from "bone cancer" at the age of 43. The patient's mother died in an automobile accident at the age of 4. The patient had one brother who died from a heart attack at the age of 47. The patient had no sisters.  GYNECOLOGIC HISTORY: Menarche age 70, she is GX P1 with first live birth at age 57. She underwent to the change of life approximately 1978. She did not take hormone replacement.  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 Her husband died in a plane crash approximately 5 years ago. The husband son, Crystal [name], 37, works in [employer] as an Personnel officer. The patient has no grandchildren. She lives alone with her cat Nicholaus Bloom.  ADVANCED DIRECTIVES: in place per Dr Debby Bud' note  HEALTH MAINTENANCE: History  Substance Use Topics  . Smoking status: Former Smoker    Quit date: 10/22/1965  . Smokeless tobacco: Not on file  . Alcohol  Use: 1.8 oz/week    3 Glasses of wine per week     Colonoscopy: 2010  PAP: 2011  Bone density: 2011/ osteopenia  Lipid panel: per Dr Debby Bud  No Known Allergies  Current Outpatient Prescriptions  Medication Sig Dispense Refill  . amLODipine (NORVASC) 5 MG tablet TAKE ONE TABLET BY MOUTH ONE TIME DAILY  30 tablet  8  . aspirin 325 MG EC tablet Take 325 mg by mouth daily.      . lansoprazole (PREVACID) 30 MG capsule Take 30 mg by mouth daily.      . mesalamine (CANASA) 1000 MG suppository Place 1,000 mg rectally at bedtime as needed.       . metoCLOPramide (REGLAN) 10 MG/10ML SOLN  Take 10 mg by mouth.      . polyethylene glycol (MIRALAX / GLYCOLAX) packet Take 17 g by mouth daily.      . Sennosides (PERDIEM OVERNIGHT RELIEF) 15 MG TABS Take 30 mg by mouth at bedtime as needed.        OBJECTIVE: elderly white woman who appears anxious Filed Vitals:   02/06/12 1244  BP: 162/91  Pulse: 92  Temp: 98.8 F (37.1 C)     Body mass index is 20.28 kg/(m^2).    ECOG FS:1  Sclerae unicteric Oropharynx clear No peripheral adenopathy Lungs no rales or rhonchi Heart regular rate and rhythm Abd benign MSK no focal spinal tenderness, no peripheral edema Neuro: nonfocal Breasts: in the lower right breast there is a palpable mass measuring approximately 1-1/2-2 cm by palpation, without associated skin changes or nipple retraction. The right axilla is negative. The left breast and left axilla are unremarkable  LAB RESULTS: Lab Results  Component Value Date   WBC 8.2 02/06/2012   NEUTROABS 6.2 02/06/2012   HGB 12.3 02/06/2012   HCT 36.5 02/06/2012   MCV 93.9 02/06/2012   PLT 248 02/06/2012      Chemistry      Component Value Date/Time   NA 143 02/06/2012 1217   K 3.4* 02/06/2012 1217   CL 103 02/06/2012 1217   CO2 31 02/06/2012 1217   BUN 13 02/06/2012 1217   CREATININE 1.00 02/06/2012 1217      Component Value Date/Time   CALCIUM 9.4 02/06/2012 1217   ALKPHOS 93 02/06/2012 1217   AST 22  02/06/2012 1217   ALT 13 02/06/2012 1217   BILITOT 1.0 02/06/2012 1217       Lab Results  Component Value Date   LABCA2 10 02/06/2012    No components found with this basename: [id number]    No results found for this basename: INR:1;PROTIME:1 in the last 168 hours  Urinalysis    Component Value Date/Time   COLORURINE LT. YELLOW 01/27/2009 0833   APPEARANCEUR CLEAR 01/27/2009 0833   LABSPEC 1.025 01/27/2009 0833   PHURINE 5.5 01/27/2009 0833   BILIRUBINUR NEGATIVE 01/27/2009 0833   KETONESUR NEGATIVE 01/27/2009 0833   UROBILINOGEN 0.2 01/27/2009 0833   NITRITE NEGATIVE 01/27/2009 0833   LEUKOCYTESUR NEGATIVE 01/27/2009 1027    STUDIES:  Mr Breast Bilateral W Wo Contrast  02/05/2012  *RADIOLOGY REPORT*  Clinical Data: New diagnosis right-sided breast cancer  BILATERAL BREAST MRI WITH AND WITHOUT CONTRAST  Technique: Multiplanar, multisequence MR images of both breasts were obtained prior to and following the intravenous administration of 10ml of Multihance.  Three dimensional images were evaluated at the independent DynaCad workstation.  Comparison:  Multiple prior mammograms most recently dated 01/29/2012.  Findings: Foci of nonspecific enhancement are seen bilaterally.  A round mass with irregular margins and low grade enhancement is seen in the lower central aspect of the right breast, posteriorly measuring 1.3 x 1.0 x 1.4 cm compatible the biopsy-proven malignancy. A biopsy clip is seen in association with the mass.  No enhancement is seen involving the pectoralis muscle.  No other suspicious mass or enhancement is seen in either breast. A 1.0 cm enhancing mass with nonenhancing septations and circumscribed margins is seen in the retroareolar region of the right breast, middle third compatible with a fibroadenoma.  No axillary or internal mammary adenopathy is present.  IMPRESSION: Biopsy-proven malignancy, right breast detailed above.  No axillary or internal mammary adenopathy is seen.  No MRI specific  evidence  of malignancy, left breast.  THREE-DIMENSIONAL MR IMAGE RENDERING ON INDEPENDENT WORKSTATION:  Three-dimensional MR images were rendered by post-processing of the original MR data on an independent workstation.  The three- dimensional MR images were interpreted, and findings were reported in the accompanying complete MRI report for this study.  BI-RADS CATEGORY 6:  Known biopsy-proven malignancy - appropriate action should be taken.  Original Report Authenticated By: Hiram Gash, M.D.   ASSESSMENT: 66 year old [place] woman status post right breast biopsy 01/29/2012 for a clinical T1CN0, stage IA, invasive lobular breast cancer, grade 1, strongly estrogen and progesterone receptor positive, with a low MIB and no evidence of HER-2 amplification.  PLAN: we spent the better part of her hour-long visit today discussing her situation in detail, and the patient understands she is a very good candidate for breast conservation surgery. Given that we are dealing with a lobular, estrogen receptor positive, and grade 1 tumor, all those factors predicting little benefit from chemotherapy, I anticipate she will not need chemotherapy, although that option was discussed today as suggested by NCCN guidelines. An Oncotype may help [place] to clarify that decision. If margins are ample and there is no lymph node involvement, it may be that radiation will be optional for her. She will discuss this further with Dr. Mitzi Hansen.  In any case she will need antiestrogen therapy for 5-10 years. We will discuss that in more detail when she returns to see me in early June. By then we should have all the necessary data to make a definitive decision regarding optimal adjuvant therapy.   Magen Suriano C    02/10/2012

## 2012-02-11 ENCOUNTER — Telehealth: Payer: Self-pay | Admitting: *Deleted

## 2012-02-11 NOTE — Telephone Encounter (Signed)
Spoke to pt concerning [facility] from 02/06/12.  Pt denies questions or concerns regarding dx or treatment care plan.  Pt relate she is "anxious to get surgery over with".  Encourage pt to call with needs.  Received verbal understanding.  Contact information given.

## 2012-02-16 NOTE — Progress Notes (Addendum)
CC:   Lodema Pilot, MD Lowella Dell, M.D.  REFERRING PHYSICIAN:  Lodema Pilot, MD  HISTORY OF PRESENT ILLNESS:  Crystal [name] is a pleasant 66 year old female who was seen today regarding a recent diagnosis of invasive mammary carcinoma of the right breast.  The patient states that she was found to have a palpable left breast on a routine gynecologic clinical visit. She feels that there may have been a little bit of subtle changes in the right breast previously when she looked back on things but this was not really something that she noticed prior to this finding on exam.  She therefore presented for [facility].  The new palpable mass was not well demonstrated on mammogram but a palpable firm finding was present once again on exam.  Ultrasound showed an irregular hypoechoic mass in the area of the palpable abnormality measuring 1.1 cm.  The patient has therefore undergone biopsy of this lesion and this returned positive for an invasive lobular carcinoma.  Receptor studies have indicated that the tumor is strongly ER and PR positive, as well as HER-2/neu negative.  The KI 67 staining was 14%.  The patient then proceeded to undergo an MRI scan of the breast bilaterally.  Within the lower central aspect of the right breast posteriorly there is a 1.3 x 1.0 x 1.4 cm mass compatible with the biopsy proven malignancy.  A 1.0 cm mass was also present within the retroareolar region compatible with a fibroadenoma.  No axillary or internal mammary adenopathy was present. Clinically, therefore, on imaging this corresponded to a T1c N0 tumor. The patient this therefore seen In [facility]idisciplinary [facility] Clinic today for consideration and discussion of a possible course of adjuvant radiation treatment.  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  CURRENT MEDICATIONS:  Amlodipine and aspirin.  ALLERGIES:  No known drug allergies.  FAMILY HISTORY:  There is no family history of breast cancer.  The patient indicates that her father suffered from what she described as "bone cancer".  SOCIAL HISTORY:  The patient is widowed.  She is currently retired.  She has 1 son who is age 36.  She denies any history of current smoking. She states that she quit in 1967.  She drinks alcohol on an occasional basis.  REVIEW OF SYSTEMS:  Fully reviewed and negative other than as above. Overall the patient states that she feels quite healthy and she feels like she is quite active.  PHYSICAL EXAM:  Vital Signs:  Weight 109 pounds, blood pressure 162/91, pulse 92, respiratory rate 20, temperature 98.8.  General:  Well- developed female in no acute distress, alert and oriented x3.  HEENT: Normocephalic, atraumatic.  Oral cavity clear.  Neck:  Supple without any lymphadenopathy.  Cardiovascular:  Regular rate and rhythm. Respiratory:  Clear to auscultation bilaterally.  GI:  Abdomen soft, nontender, normal bowel sounds.  Extremities:  No edema present. Neurologic:  No focal deficits.  Breasts:  There is some bruising within the right breast.  A palpable abnormality is present within the lower inner quadrant of the right breast, extending to midline.  No palpable lymphadenopathy present within the right axilla.  No palpable findings in the left breast and no axillary adenopathy present on the left.  IMPRESSION AND PLAN:  The patient is a pleasant 66 year old female with  a diagnosis of invasive lobular carcinoma of the right breast.  The patient clinically has a T1c N0 M0 invasive lobular carcinoma.  Receptor studies have indicated that the tumor is an ER positive, PR positive and HER-2/neu negative tumor.  I had the patient seen in multidisciplinary clinic  today for discussion of a possible course of adjuvant radiotherapy.  We discussed breast conservation treatment in addition to other modalities such as mastectomy.  She does appear to be a good candidate for breast conservation treatment at this point and she expressed interest in this.  The patient is seeing Dr. Biagio Quint today to discuss surgery and it was our recommendation at breast conference to proceed with this if this was her desire.  Dr. Darnelle Catalan is seeing the patient today to discuss possible systemic treatment, and he feels that an OncoType may be helpful in her case to clarify the role of possible chemotherapy.  It is his suspicion that the benefit of this would be quite low and he does not feel that he will, in all likelihood, ultimately recommend chemotherapy.  Based on the patient's case, age, and health status, I believe that an adjuvant course of radiation treatment would be reasonable to strongly consider for her case.  I discussed with her the possible benefit of such a treatment in terms of local and regional control and I also discussed the possible side effects and risks of such a treatment as well.  We did discuss what would be entailed in a typical and anticipated 6-1/2 week course of such a treatment.  All of her questions were answered.  I therefore would like to see the patient back postoperatively to review her final pathology at that time and to further discuss a possible course of radiation treatment.    ______________________________ Radene Gunning, M.D., Ph.D. JSM/MEDQ  D:  02/15/2012  T:  02/16/2012  Job:  [id number]

## 2012-02-18 ENCOUNTER — Encounter (INDEPENDENT_AMBULATORY_CARE_PROVIDER_SITE_OTHER): Payer: Self-pay

## 2012-02-19 ENCOUNTER — Other Ambulatory Visit (INDEPENDENT_AMBULATORY_CARE_PROVIDER_SITE_OTHER): Payer: Self-pay | Admitting: General Surgery

## 2012-02-19 DIAGNOSIS — C50911 Malignant neoplasm of unspecified site of right female breast: Secondary | ICD-10-CM

## 2012-02-26 DIAGNOSIS — N9489 Other specified conditions associated with female genital organs and menstrual cycle: Secondary | ICD-10-CM | POA: Diagnosis not present

## 2012-02-26 DIAGNOSIS — N390 Urinary tract infection, site not specified: Secondary | ICD-10-CM | POA: Diagnosis not present

## 2012-03-03 ENCOUNTER — Encounter (HOSPITAL_COMMUNITY)
Admission: RE | Admit: 2012-03-03 | Discharge: 2012-03-03 | Disposition: A | Discharge status: Home / Self Care | Payer: Medicare Other | Source: Ambulatory Visit | Attending: General Surgery | Admitting: General Surgery

## 2012-03-03 ENCOUNTER — Encounter (HOSPITAL_COMMUNITY): Payer: Self-pay

## 2012-03-03 ENCOUNTER — Encounter (HOSPITAL_COMMUNITY)
Admission: RE | Admit: 2012-03-03 | Discharge: 2012-03-03 | Disposition: A | Discharge status: Home / Self Care | Payer: Medicare Other | Source: Ambulatory Visit | Attending: Anesthesiology | Admitting: Anesthesiology

## 2012-03-03 VITALS — BP 145/80 | HR 98 | Temp 97.6°F | Resp 20 | Ht 62.0 in | Wt 107.4 lb

## 2012-03-03 DIAGNOSIS — I10 Essential (primary) hypertension: Secondary | ICD-10-CM | POA: Diagnosis not present

## 2012-03-03 DIAGNOSIS — Z01811 Encounter for preprocedural respiratory examination: Secondary | ICD-10-CM | POA: Diagnosis not present

## 2012-03-03 DIAGNOSIS — Z170 Estrogen receptor positive status [ER+]: Secondary | ICD-10-CM | POA: Diagnosis not present

## 2012-03-03 DIAGNOSIS — C50919 Malignant neoplasm of unspecified site of unspecified female breast: Secondary | ICD-10-CM | POA: Diagnosis not present

## 2012-03-03 DIAGNOSIS — C50911 Malignant neoplasm of unspecified site of right female breast: Secondary | ICD-10-CM

## 2012-03-03 DIAGNOSIS — J984 Other disorders of lung: Secondary | ICD-10-CM | POA: Diagnosis not present

## 2012-03-03 LAB — CBC
HCT: 37.2 % (ref 36.0–46.0)
Hemoglobin: 12.7 g/dL (ref 12.0–15.0)
MCH: 31.8 pg (ref 26.0–34.0)
MCV: 93 fL (ref 78.0–100.0)
Platelets: 300 10*3/uL (ref 150–400)
RBC: 4 MIL/uL (ref 3.87–5.11)
RDW: 11.8 % (ref 11.5–15.5)
WBC: 5.5 10*3/uL (ref 4.0–10.5)

## 2012-03-03 LAB — BASIC METABOLIC PANEL
CO2: 27 mEq/L (ref 19–32)
Calcium: 10.2 mg/dL (ref 8.4–10.5)
Chloride: 99 mEq/L (ref 96–112)
Creatinine, Ser: 0.8 mg/dL (ref 0.50–1.10)
Glucose, Bld: 88 mg/dL (ref 70–99)
Sodium: 137 mEq/L (ref 135–145)

## 2012-03-03 NOTE — Pre-Procedure Instructions (Addendum)
20 Crystal [name]  03/03/2012   Your procedure is scheduled on:  Tuesday, May 21st. 9:30 Appointment at [facility]   Report to [facility] at after breast center   Call this number if you have problems the morning of surgery: [phone]   Remember:   Do not eat food:After Midnight.  May have clear liquids: up to 4 Hours before arrival.  Clear liquids include soda, tea, black coffee, apple or grape juice, broth.  Take these medicines the morning of surgery with A SIP OF WATER: Amlodipine (Norvasc), Lansoprazole (Prevacid), Metoclopramide (Reglan)   Do not wear jewelry, make-up or nail polish.  Do not wear lotions, powders, or perfumes. You may wear deodorant.  Do not shave 48 hours prior to surgery.  Do not bring valuables to the hospital.  Contacts, dentures or bridgework may not be worn into surgery.  Leave suitcase in the car. After surgery it may be brought to your room.  For patients admitted to the hospital, checkout time is 11:00 AM the day of discharge.   Patients discharged the day of surgery will not be allowed to drive home.  Name and phone number of your driver [name] Special Instructions: CHG Shower Use Special Wash: 1/2 bottle night before surgery and 1/2 bottle morning of surgery.   Please read over the following fact sheets that you were given: Pain Booklet, Coughing and Deep Breathing, MRSA Information and Surgical Site Infection Prevention

## 2012-03-04 ENCOUNTER — Encounter: Payer: Self-pay | Admitting: *Deleted

## 2012-03-04 ENCOUNTER — Other Ambulatory Visit: Payer: Self-pay | Admitting: Oncology

## 2012-03-04 ENCOUNTER — Telehealth: Payer: Self-pay | Admitting: Oncology

## 2012-03-04 NOTE — Telephone Encounter (Signed)
lmonvm adviisng the pt of her r/s appt with dr Darnelle Catalan

## 2012-03-10 MED ORDER — CEFAZOLIN SODIUM-DEXTROSE 2-3 GM-% IV SOLR
2.0000 g | INTRAVENOUS | Status: AC
  Administered 2012-03-11: 2 g via INTRAVENOUS
  Filled 2012-03-10: qty 50

## 2012-03-11 ENCOUNTER — Encounter (HOSPITAL_COMMUNITY): Payer: Self-pay | Admitting: Anesthesiology

## 2012-03-11 ENCOUNTER — Encounter (HOSPITAL_COMMUNITY)
Admission: RE | Disposition: A | Discharge status: Home / Self Care | Payer: Self-pay | Source: Ambulatory Visit | Attending: General Surgery

## 2012-03-11 ENCOUNTER — Ambulatory Visit
Admission: RE | Admit: 2012-03-11 | Discharge: 2012-03-11 | Disposition: A | Discharge status: Home / Self Care | Payer: Medicare Other | Source: Ambulatory Visit | Attending: General Surgery | Admitting: General Surgery

## 2012-03-11 ENCOUNTER — Ambulatory Visit (HOSPITAL_COMMUNITY)
Admission: RE | Admit: 2012-03-11 | Discharge: 2012-03-11 | Disposition: A | Discharge status: Home / Self Care | Payer: Medicare Other | Source: Ambulatory Visit | Attending: General Surgery | Admitting: General Surgery

## 2012-03-11 ENCOUNTER — Encounter (HOSPITAL_COMMUNITY): Payer: Self-pay

## 2012-03-11 ENCOUNTER — Ambulatory Visit (HOSPITAL_COMMUNITY): Payer: Medicare Other | Admitting: Anesthesiology

## 2012-03-11 ENCOUNTER — Other Ambulatory Visit (INDEPENDENT_AMBULATORY_CARE_PROVIDER_SITE_OTHER): Payer: Self-pay | Admitting: General Surgery

## 2012-03-11 DIAGNOSIS — C50919 Malignant neoplasm of unspecified site of unspecified female breast: Secondary | ICD-10-CM

## 2012-03-11 DIAGNOSIS — Z170 Estrogen receptor positive status [ER+]: Secondary | ICD-10-CM | POA: Insufficient documentation

## 2012-03-11 DIAGNOSIS — C50911 Malignant neoplasm of unspecified site of right female breast: Secondary | ICD-10-CM

## 2012-03-11 DIAGNOSIS — I10 Essential (primary) hypertension: Secondary | ICD-10-CM | POA: Diagnosis not present

## 2012-03-11 HISTORY — DX: Malignant neoplasm of unspecified site of unspecified female breast: C50.919

## 2012-03-11 SURGERY — BREAST LUMPECTOMY WITH NEEDLE LOCALIZATION AND AXILLARY SENTINEL LYMPH NODE BX
Anesthesia: General | Laterality: Right | Wound class: Clean

## 2012-03-11 MED ORDER — LACTATED RINGERS IV SOLN
INTRAVENOUS | Status: DC | PRN
  Administered 2012-03-11 (×2): via INTRAVENOUS

## 2012-03-11 MED ORDER — FENTANYL CITRATE 0.05 MG/ML IJ SOLN
INTRAMUSCULAR | Status: DC | PRN
  Administered 2012-03-11 (×2): 50 ug via INTRAVENOUS

## 2012-03-11 MED ORDER — HYDROMORPHONE HCL PF 1 MG/ML IJ SOLN
0.2500 mg | INTRAMUSCULAR | Status: DC | PRN
  Administered 2012-03-11 (×4): 0.25 mg via INTRAVENOUS

## 2012-03-11 MED ORDER — MIDAZOLAM HCL 5 MG/5ML IJ SOLN
INTRAMUSCULAR | Status: DC | PRN
  Administered 2012-03-11: 1 mg via INTRAVENOUS

## 2012-03-11 MED ORDER — PROMETHAZINE HCL 25 MG/ML IJ SOLN: 6.2500 mg | INTRAMUSCULAR | Status: DC | PRN

## 2012-03-11 MED ORDER — SODIUM CHLORIDE 0.9 % IJ SOLN
INTRAMUSCULAR | Status: DC | PRN
  Administered 2012-03-11: 3 mL

## 2012-03-11 MED ORDER — LIDOCAINE HCL (CARDIAC) 20 MG/ML IV SOLN
INTRAVENOUS | Status: DC | PRN
  Administered 2012-03-11: 100 mg via INTRAVENOUS

## 2012-03-11 MED ORDER — SODIUM CHLORIDE 0.9 % IJ SOLN
INTRAMUSCULAR | Status: DC | PRN
  Administered 2012-03-11: 12:00:00 via INTRAMUSCULAR

## 2012-03-11 MED ORDER — TECHNETIUM TC 99M SULFUR COLLOID FILTERED
1.0000 | Freq: Once | INTRAVENOUS | Status: AC | PRN
  Administered 2012-03-11: 1 via INTRADERMAL

## 2012-03-11 MED ORDER — MEPERIDINE HCL 25 MG/ML IJ SOLN: 6.2500 mg | INTRAMUSCULAR | Status: DC | PRN

## 2012-03-11 MED ORDER — HYDROCODONE-ACETAMINOPHEN 5-500 MG PO TABS: 1.0000 | ORAL_TABLET | Freq: Four times a day (QID) | ORAL | Status: AC | PRN

## 2012-03-11 MED ORDER — LACTATED RINGERS IV SOLN
INTRAVENOUS | Status: DC
  Administered 2012-03-11: 12:00:00 via INTRAVENOUS

## 2012-03-11 MED ORDER — ONDANSETRON HCL 4 MG/2ML IJ SOLN
INTRAMUSCULAR | Status: DC | PRN
  Administered 2012-03-11: 4 mg via INTRAVENOUS

## 2012-03-11 MED ORDER — PROPOFOL 10 MG/ML IV EMUL
INTRAVENOUS | Status: DC | PRN
  Administered 2012-03-11: 150 mg via INTRAVENOUS

## 2012-03-11 MED ORDER — EPHEDRINE SULFATE 50 MG/ML IJ SOLN
INTRAMUSCULAR | Status: DC | PRN
  Administered 2012-03-11: 5 mg via INTRAVENOUS
  Administered 2012-03-11: 10 mg via INTRAVENOUS

## 2012-03-11 MED ORDER — LACTATED RINGERS IV SOLN: INTRAVENOUS | Status: DC

## 2012-03-11 MED ORDER — BUPIVACAINE HCL 0.25 % IJ SOLN
INTRAMUSCULAR | Status: DC | PRN
  Administered 2012-03-11: 20 mL

## 2012-03-11 MED ORDER — LIDOCAINE-EPINEPHRINE 1 %-1:100000 IJ SOLN
INTRAMUSCULAR | Status: DC | PRN
  Administered 2012-03-11: 20 mL

## 2012-03-11 SURGICAL SUPPLY — 46 items
APPLIER CLIP 9.375 SM OPEN (CLIP)
BINDER BREAST LRG (GAUZE/BANDAGES/DRESSINGS) IMPLANT
BINDER BREAST MEDIUM (GAUZE/BANDAGES/DRESSINGS) ×4 IMPLANT
BINDER BREAST XLRG (GAUZE/BANDAGES/DRESSINGS) IMPLANT
BNDG COHESIVE 4X5 TAN STRL (GAUZE/BANDAGES/DRESSINGS) ×2 IMPLANT
CANISTER SUCTION 2500CC (MISCELLANEOUS) ×2 IMPLANT
CHLORAPREP W/TINT 26ML (MISCELLANEOUS) ×2 IMPLANT
CLIP APPLIE 9.375 SM OPEN (CLIP) IMPLANT
CLOTH BEACON ORANGE TIMEOUT ST (SAFETY) ×2 IMPLANT
CONT SPEC 4OZ CLIKSEAL STRL BL (MISCELLANEOUS) ×2 IMPLANT
COVER MAYO STAND STRL (DRAPES) ×2 IMPLANT
COVER PROBE W GEL 5X96 (DRAPES) ×4 IMPLANT
COVER SURGICAL LIGHT HANDLE (MISCELLANEOUS) ×2 IMPLANT
DERMABOND ADHESIVE PROPEN (GAUZE/BANDAGES/DRESSINGS) ×1
DERMABOND ADVANCED (GAUZE/BANDAGES/DRESSINGS) ×1
DERMABOND ADVANCED .7 DNX12 (GAUZE/BANDAGES/DRESSINGS) ×1 IMPLANT
DERMABOND ADVANCED .7 DNX6 (GAUZE/BANDAGES/DRESSINGS) ×1 IMPLANT
DEVICE DUBIN SPECIMEN MAMMOGRA (MISCELLANEOUS) ×2 IMPLANT
DRAPE CHEST BREAST 15X10 FENES (DRAPES) ×2 IMPLANT
DRAPE UTILITY 15X26 W/TAPE STR (DRAPE) ×4 IMPLANT
ELECT COATED BLADE 2.86 ST (ELECTRODE) ×2 IMPLANT
ELECT REM PT RETURN 9FT ADLT (ELECTROSURGICAL) ×2
ELECTRODE REM PT RTRN 9FT ADLT (ELECTROSURGICAL) ×1 IMPLANT
GLOVE BIOGEL PI IND STRL 7.0 (GLOVE) ×2 IMPLANT
GLOVE BIOGEL PI INDICATOR 7.0 (GLOVE) ×2
GLOVE SURG SS PI 7.5 STRL IVOR (GLOVE) ×4 IMPLANT
GOWN PREVENTION PLUS XLARGE (GOWN DISPOSABLE) ×2 IMPLANT
GOWN STRL NON-REIN LRG LVL3 (GOWN DISPOSABLE) ×2 IMPLANT
KIT BASIN OR (CUSTOM PROCEDURE TRAY) ×2 IMPLANT
KIT ROOM TURNOVER OR (KITS) ×2 IMPLANT
NEEDLE 18GX1X1/2 (RX/OR ONLY) (NEEDLE) ×2 IMPLANT
NEEDLE HYPO 25GX1X1/2 BEV (NEEDLE) ×4 IMPLANT
NS IRRIG 1000ML POUR BTL (IV SOLUTION) ×2 IMPLANT
PACK GENERAL/GYN (CUSTOM PROCEDURE TRAY) ×2 IMPLANT
PAD ARMBOARD 7.5X6 YLW CONV (MISCELLANEOUS) ×2 IMPLANT
SPONGE GAUZE 4X4 12PLY (GAUZE/BANDAGES/DRESSINGS) IMPLANT
STOCKINETTE IMPERVIOUS 9X36 MD (GAUZE/BANDAGES/DRESSINGS) ×2 IMPLANT
SUT ETHILON 3 0 FSL (SUTURE) IMPLANT
SUT MON AB 4-0 PC3 18 (SUTURE) ×2 IMPLANT
SUT SILK 2 0 SH (SUTURE) ×2 IMPLANT
SUT VIC AB 3-0 54X BRD REEL (SUTURE) ×1 IMPLANT
SUT VIC AB 3-0 BRD 54 (SUTURE) ×1
SUT VIC AB 3-0 SH 8-18 (SUTURE) ×2 IMPLANT
SYR CONTROL 10ML LL (SYRINGE) ×4 IMPLANT
TOWEL OR 17X24 6PK STRL BLUE (TOWEL DISPOSABLE) ×2 IMPLANT
TOWEL OR 17X26 10 PK STRL BLUE (TOWEL DISPOSABLE) ×2 IMPLANT

## 2012-03-11 NOTE — H&P (Signed)
Crystal [name] is a 66 y.o. female. This patient is seen in our multidisciplinary breast clinic for evaluation of a newly diagnosed invasive right breast cancer. She first noticed some dimpling on the inferior portion of her right breast while raising her arms but she could not feel any O. Masses. She presented to her physician and she had a followup mammogram, ultrasound and MRI which demonstrated a concerning lesion at the inferior medial portion of her right breast and biopsy was performed which demonstrated invasive breast cancer which was ER and PR positive and HER-2/neu negative. She says that she does do self breast exam and has not noticed any suspicious lesions. She denies any systemic symptoms such as headaches, bony pains, or weight loss. She does have a history of 2 benign breast tumors which were excised from her left breast. She does have a history of TIA as well in 2004 as well as some shortness of breath with activity.  HPI  Past Medical History   Diagnosis  Date   .  Abdominal aortic ectasia  01/31/2010     Qualifier: Diagnosis of By: Debby Bud MD, Rosalyn Gess  ABDOMINAL PAIN RIGHT UPPER QUADRANT  11/07/2010     Qualifier: Diagnosis of By: Jarold Motto MD Lang Snow  CEREBROVASCULAR DISEASE  06/16/2010     Qualifier: Diagnosis of By: Jonny Ruiz MD, Len Blalock   .  CONSTIPATION  01/12/2009     Qualifier: Diagnosis of By: Koleen Distance CMA (AAMA), Hulan Saas   .  Cough  08/15/2010     Qualifier: Diagnosis of By: Jarold Motto MD Lang Snow  DETRUSOR, OVERACTIVE  03/02/2010     Qualifier: Diagnosis of By: Debby Bud MD, Rosalyn Gess  ESOPHAGEAL STRICTURE  01/12/2009     Qualifier: Diagnosis of By: Koleen Distance CMA (AAMA), Hulan Saas   .  EXTERNAL HEMORRHOIDS  01/12/2009     Qualifier: Diagnosis of By: Koleen Distance CMA (AAMA), Hulan Saas   .  GERD  05/10/2007     Qualifier: Diagnosis of By: Charlsie Quest RMA, Lucy   .  HYPERLIPIDEMIA  05/10/2007     Qualifier: Diagnosis of By: Tyrone Apple, Lucy   .  HYPERTENSION  05/10/2007    Qualifier: Diagnosis of By: Tyrone Apple, Lucy   .  HYPOTENSION, ORTHOSTATIC  06/16/2010     Qualifier: Diagnosis of By: Jonny Ruiz MD, Len Blalock   .  Irritable bowel syndrome  01/12/2009     Qualifier: Diagnosis of By: Koleen Distance CMA (AAMA), Hulan Saas   .  MELANOSIS COLI  02/16/2009     Qualifier: Diagnosis of By: Koleen Distance CMA (AAMA), Hulan Saas   .  PERIPHERAL VASCULAR DISEASE  06/17/2010     Qualifier: Diagnosis of By: Jonny Ruiz MD, Len Blalock   .  RECTAL FISSURE  02/16/2009     Qualifier: Diagnosis of By: Koleen Distance CMA (AAMA), Hulan Saas   .  STENOSIS, RECTAL  02/16/2009     Qualifier: Diagnosis of By: Koleen Distance CMA (AAMA), Hulan Saas   .  SYNCOPE  04/19/2008     Qualifier: Diagnosis of By: Debby Bud MD, Rosalyn Gess   .  TACHYARRHYTHMIA  09/03/2009     Qualifier: Diagnosis of By: Debby Bud MD, Rosalyn Gess  TRANSIENT ISCHEMIC ATTACK  03/02/2010     Qualifier: Diagnosis of By: Debby Bud MD, Rosalyn Gess  VENOUS INSUFFICIENCY, LEGS  07/18/2010     Qualifier: Diagnosis of By: Debby Bud MD, Rosalyn Gess   .  Eosinophilic esophagitis    .  Dysphagia    .  Orthostatic hypotension    .  Overactive detrusor    .  Dizziness and giddiness    .  Sinusitis acute    .  Personal history of other diseases of digestive system    .  Anal or rectal pain    .  Other malaise and fatigue    .  Adjustment disorder with depressed mood    .  Routine general medical examination at a health care facility     Past Surgical History   Procedure  Date   .  Appendectomy    .  Abdominal hysterectomy    .  Breast surgery      left breast x 2   .  Hemmorhoidectomy    .  Cardiac catherization    .  Esophageal tumor excised; posterior 1986    .  Electrocardiogram  10/30/2006   .  Edg  04/21/2007    Family History   Problem  Relation  Age of Onset   .  Bone cancer  Father    .  Cancer  Father       Bone    .  Heart disease  Brother    .  Colon cancer  Neg Hx    .  Breast cancer  Neg Hx    .  Diabetes  Neg Hx    Social History  History   Substance Use Topics   .   Smoking status:  Former Smoker     Quit date:  10/22/1965   .  Smokeless tobacco:  Not on file   .  Alcohol Use:  1.8 oz/week     3 Glasses of wine per week   No Known Allergies  Current Outpatient Prescriptions   Medication  Sig  Dispense  Refill   .  amLODipine (NORVASC) 5 MG tablet  TAKE ONE TABLET BY MOUTH ONE TIME DAILY  30 tablet  8   .  aspirin 325 MG EC tablet  Take 325 mg by mouth daily.     .  lansoprazole (PREVACID) 30 MG capsule  Take 30 mg by mouth daily.     .  mesalamine (CANASA) 1000 MG suppository  Place 1,000 mg rectally at bedtime as needed.     .  metoCLOPramide (REGLAN) 10 MG/10ML SOLN  Take 10 mg by mouth.     .  polyethylene glycol (MIRALAX / GLYCOLAX) packet  Take 17 g by mouth daily.     .  Sennosides (PERDIEM OVERNIGHT RELIEF) 15 MG TABS  Take 30 mg by mouth at bedtime as needed.     Review of Systems  Review of Systems  All other review of systems negative or noncontributory except as stated in the HPI  There were no vitals taken for this visit.  Physical Exam  Physical Exam  Wt Readings from Last 3 Encounters:   02/06/12  109 lb 1.6 oz (49.487 kg)   01/07/12  108 lb 4 oz (49.102 kg)   01/04/11  107 lb (48.535 kg)    Temp Readings from Last 3 Encounters:   02/06/12  98.8 F (37.1 C) Oral   01/07/12  97.8 F (36.6 C) Oral    BP Readings from Last 3 Encounters:   02/06/12  162/91   01/07/12  122/82   01/04/11  146/90    Pulse Readings from Last 3 Encounters:   02/06/12  92   01/07/12  94   01/04/11  55   Physical Exam  Nursing note and vitals reviewed.  Constitutional: She is oriented to person, place, and time. She appears well-developed and well-nourished. No distress.  HENT:  Head: Normocephalic and atraumatic.  Mouth/Throat: No oropharyngeal exudate.  Eyes: Conjunctivae and EOM are normal. Pupils are equal, round, and reactive to light. Right eye exhibits no discharge. Left eye exhibits no discharge. No scleral icterus.  Neck: Normal range  of motion. Neck supple. No tracheal deviation present.  Cardiovascular: Normal rate, regular rhythm, normal heart sounds and intact distal pulses.  Pulmonary/Chest: Effort normal and breath sounds normal. No stridor. No respiratory distress. She has no wheezes.  Abdominal: Soft. Bowel sounds are normal. She exhibits no distension and no mass. There is no tenderness. There is no rebound and no guarding.  Musculoskeletal: Normal range of motion. She exhibits no edema and no tenderness.  Neurological: She is alert and oriented to person, place, and time.  Skin: Skin is warm and dry. No rash noted. She is not diaphoretic. No erythema. No pallor.  Psychiatric: She has a normal mood and affect. Her behavior is normal. Judgment and thought content normal.  Breasts: the right breast she does have some dimpling at the 5:00 position of her breast near the inframammary crease. She also has a palpable mass at the 5:00 position which measures approximately 3 cm wide by 2 cm tall with a well-healed biopsy tract in the medial aspect. I do not appreciate any suspicious lymphadenopathy or skin changes on the right.  Her left breast is normal on exam with no significant skin changes or palpable masses or lymphadenopathy  Data Reviewed  Ultrasound, mammogram, MRI, pathology results  Assessment  Right invasive breast cancer  Pt seen and evaluated in the preop area.  RIsks of the procedure again discussed.  She denies any changes since our last visit.  She desires right-sided needle localized lumpectomy with sentinel lymph node biopsy followed by radiation therapy. We discussed the risks of possible need for a completion and axillary dissection or reexcision of margins. We also discussed the risk of infection, bleeding, pain, scarring, recurrence, poor cosmesis, nerve injury, and lymphedema and she expressed understanding and would like to proceed with needle localized right-sided lumpectomy with sentinel lymph node  biopsy. Site marked and needle placed.

## 2012-03-11 NOTE — Anesthesia Postprocedure Evaluation (Signed)
  Anesthesia Post-op Note  Patient: Crystal [name]  Procedure(s) Performed: Procedure(s) (LRB): BREAST LUMPECTOMY WITH NEEDLE LOCALIZATION AND AXILLARY SENTINEL LYMPH NODE BX (Right)  Patient Location: PACU  Anesthesia Type: General  Level of Consciousness: awake and alert   Airway and Oxygen Therapy: Patient Spontanous Breathing  Post-op Pain: mild  Post-op Assessment: Post-op Vital signs reviewed, Patient's Cardiovascular Status Stable, Respiratory Function Stable, Patent Airway and No signs of Nausea or vomiting  Post-op Vital Signs: stable  Complications: No apparent anesthesia complications

## 2012-03-11 NOTE — Anesthesia Procedure Notes (Signed)
Procedure Name: LMA Insertion Date/Time: 03/11/2012 12:07 PM Performed by: Carmela Rima Pre-anesthesia Checklist: Patient identified, Emergency Drugs available, Suction available, Patient being monitored and Timeout performed Patient Re-evaluated:Patient Re-evaluated prior to inductionOxygen Delivery Method: Circle system utilized Preoxygenation: Pre-oxygenation with 100% oxygen Intubation Type: IV induction LMA: LMA inserted LMA Size: 4.0 Number of attempts: 1 Placement Confirmation: positive ETCO2 and breath sounds checked- equal and bilateral Tube secured with: Tape Dental Injury: Teeth and Oropharynx as per pre-operative assessment

## 2012-03-11 NOTE — Op Note (Signed)
NAMEGILBERT, NARAIN            ACCOUNT NO.:  [id number]  MEDICAL RECORD NO.:  [id number]  LOCATION:  [facility]                         FACILITY:  [facility]  PHYSICIAN:  Lodema Pilot, MD       DATE OF BIRTH:  1946/07/23  DATE OF PROCEDURE:  03/11/2012 DATE OF DISCHARGE:  03/11/2012                              OPERATIVE REPORT   PROCEDURE:  Needle localized right breast lumpectomy with right sentinel lymph node biopsy.  PREOPERATIVE DIAGNOSIS:  Right breast cancer.  POSTOPERATIVE DIAGNOSIS:  Right breast cancer.  SURGEON:  Lodema Pilot, MD  ASSISTANT:  None.  ANESTHESIA:  General LMA anesthesia with 40 mL of 1% lidocaine with epinephrine and 0.25% Marcaine in a 50:50 mixture.  FLUIDS:  1100 mL of crystalloid.  ESTIMATED BLOOD LOSS:  Minimal.  DRAINS:  None.  SPECIMENS: 1. Sentinel lymph node number, which was hot and blue level 1 lymph     node. 2. Sentinel lymph node, which was level 1 lymph node, which was not     hot and not blue. 3. Right breast needle localized lumpectomy with a short stitch     marking superior margin and the long stitch marking the lateral     margin.  COMPLICATIONS:  None apparent.  FINDINGS:  Two sentinel lymph nodes removed.  No residual activity in the axillary basin.  Successful removal of the previous biopsy clip as confirmed by Dr. Deboraha Sprang and short silk stitch marking the superior margin and long silk stitch marking the lateral margin.  INDICATION FOR PROCEDURE:  MS. Britz is a 66 year old female with a recently diagnosed right breast cancer who has elected for breast conservation therapy.  OPERATIVE DETAILS:  Ms. Leath was seen and evaluated in the preoperative area and risks and benefits of procedure were again discussed in lay terms.  Informed consent was obtained.  She had already been injected with the radioactive technetium as well as had a needle localization of her right breast cancer, and the surgical site was marked prior to  anesthetic administration.  Prophylactic antibiotics were given, and she was taken to the operating room, placed on table in supine position, and general LMA anesthesia was obtained.  Her breast was prepped, and then she was injected with 5 mL of methylene blue in 4 quadrants in the circumareolar region and vigorous breast massage was performed, and her breast and chest and arm were prepped and draped in a standard surgical fashion, and the Neoprobe was used to identify the area of greatest activity in the right axilla, and a small incision was made in this area and dissection carried down to the clavipectoral fascia using Bovie electrocautery.  The Neoprobe was used to guide the dissection, and she had a small blue lymph node, which was identified with the technetium, and this node was elevated and dissected circumferentially using multiple small hemoclips and Bovie electrocautery, dissected down the node as to minimize the chance of nerve injury and again, multiple clips were used on any lymphovascular structures.  This lymph node was removed, which was both hot and blue with a gamma count of 1708 level 1 lymph node.  The probe was placed back in activity, and there was  not much additional activity.  There was little bit activity just distal to this 3 other nodes, and a second lymph node was taken, which was not blue and not hot level 1 lymph node, which was adjacent to the prior sentinel lymph node #1.  This was dissected in similar fashion and had a gamma count of 32, and was sent as sentinel lymph node #2.  There were no other blue nodes identified. There was no other significant radioactive activity in the axilla, and the basin count was 3.  There were no other palpable lymph nodes as well.  The wound was noted be hemostatic and the wound was irrigated with sterile saline solution, and the clavipectoral fascia was approximated with 2 interrupted 3-0 Vicryl sutures, and the skin  edges were approximated with 4-0 Monocryl subcuticular suture.  Then, attention was turned to the right breast lumpectomy in the needle localization site.  A circumareolar incision was made in the skin between the entrance site of the needle, which was very medial and between the edge of the nipple areolar complex.  Dissection carried down into the breast tissue using Bovie electrocautery, and breast flaps were elevated circumferentially in all directions.  After the breast flaps were elevated circumferentially, I dissected down to the pectoralis muscle medially and undermined the lesion along the pectoralis muscle taking the pectoralis fascia with the specimen, not much medial tissue additional medial margin, which can be obtained.  The inferior margin and the superficial margin would be difficult to obtain additional margin as well.  If additional superior and lateral margins are necessary, these would be amenable to additional resection.  The lesion was undermined and then the tip of the needle was identified and dissected lateral to the tip of the needle down to the pectoralis muscle, and the lesion was completely amputated.  Prior to removing the specimen, I placed a short silk stitch on the superior margin and a long silk stitch on the lateral margin, and the lesion was sent to Pathology for permanent section.  Then, the wound was inspected for hemostasis, which was noted be adequate, and we also x-rayed the specimen, and prior biopsy clip was noted to be within the specimen, and Dr. Deboraha Sprang confirmed that the specimen was adequate.  After the wound was noted be hemostatic, the wound was irrigated with sterile saline solution, and the irrigation returned clear, and the dermis was approximated with multiple interrupted 3-0 Vicryl sutures, but prior to securing the medial sutures, I filled the cavity with 40 mL of 1% lidocaine with epinephrine and 0.25% Marcaine in a 50:50 mixture using  the angiocatheter, and then the final sutures were secured for watertight closure.  The skin edges were approximated with 4-0 Monocryl subcuticular suture.  Skin was washed and dried, and Dermabond was applied at all skin incisions.  All sponge, needle, and instrument counts correct at the end of the case.  The patient tolerated the procedure well without apparent complications.          ______________________________ Lodema Pilot, MD     BL/MEDQ  D:  03/11/2012  T:  03/11/2012  Job:  [id number]

## 2012-03-11 NOTE — Transfer of Care (Signed)
Immediate Anesthesia Transfer of Care Note  Patient: Crystal [name]  Procedure(s) Performed: Procedure(s) (LRB): BREAST LUMPECTOMY WITH NEEDLE LOCALIZATION AND AXILLARY SENTINEL LYMPH NODE BX (Right)  Patient Location: PACU  Anesthesia Type: General  Level of Consciousness: awake, alert  and oriented  Airway & Oxygen Therapy: Patient Spontanous Breathing and Patient connected to nasal cannula oxygen  Post-op Assessment: Report given to PACU RN, Post -op Vital signs reviewed and stable and Patient moving all extremities X 4  Post vital signs: Reviewed and stable  Complications: No apparent anesthesia complications

## 2012-03-11 NOTE — Anesthesia Preprocedure Evaluation (Addendum)
Anesthesia Evaluation  Patient identified by MRN, date of birth, ID band Patient awake    Reviewed: Allergy & Precautions, H&P , NPO status , Patient's Chart, lab work & pertinent test results  Airway Mallampati: III TM Distance: >3 FB Neck ROM: Full  Mouth opening: Limited Mouth Opening  Dental No notable dental hx. (+) Dental Advidsory Given and Teeth Intact   Pulmonary neg pulmonary ROS,  breath sounds clear to auscultation  Pulmonary exam normal       Cardiovascular hypertension, Pt. on medications negative cardio ROS  - dysrhythmias (negative w/u) Rhythm:Regular Rate:Normal     Neuro/Psych TIAnegative neurological ROS  negative psych ROS   GI/Hepatic negative GI ROS, Neg liver ROS,   Endo/Other  negative endocrine ROS  Renal/GU negative Renal ROS  negative genitourinary   Musculoskeletal negative musculoskeletal ROS (+)   Abdominal   Peds negative pediatric ROS (+)  Hematology negative hematology ROS (+)   Anesthesia Other Findings   Reproductive/Obstetrics negative OB ROS                         Anesthesia Physical Anesthesia Plan  ASA: II  Anesthesia Plan: General   Post-op Pain Management:    Induction: Intravenous  Airway Management Planned: LMA  Additional Equipment:   Intra-op Plan:   Post-operative Plan: Extubation in OR  Informed Consent: I have reviewed the patients History and Physical, chart, labs and discussed the procedure including the risks, benefits and alternatives for the proposed anesthesia with the patient or authorized representative who has indicated his/her understanding and acceptance.   Dental advisory given and Dental Advisory Given  Plan Discussed with: CRNA, Anesthesiologist and Surgeon  Anesthesia Plan Comments:        Anesthesia Quick Evaluation

## 2012-03-11 NOTE — Progress Notes (Signed)
Spoke with [facility] in [employer]. Med. , reviewed schedule for injection.

## 2012-03-11 NOTE — Preoperative (Signed)
Beta Blockers   Reason not to administer Beta Blockers:Not Applicable 

## 2012-03-11 NOTE — Brief Op Note (Signed)
03/11/2012  1:52 PM  PATIENT:  Crystal [name]  66 y.o. female  PRE-OPERATIVE DIAGNOSIS:  right breast cancer  POST-OPERATIVE DIAGNOSIS:  Right Breast Cancer  PROCEDURE:  Procedure(s) (LRB): BREAST LUMPECTOMY WITH NEEDLE LOCALIZATION AND AXILLARY SENTINEL LYMPH NODE BX (Right)  SURGEON:  Surgeon(s) and Role:    * Lodema Pilot, DO - Primary  PHYSICIAN ASSISTANT:   ASSISTANTS: none   ANESTHESIA:   general  EBL:  Total I/O In: 1000 [I.V.:1000] Out: 25 [Blood:25]  BLOOD ADMINISTERED:none  DRAINS: none   LOCAL MEDICATIONS USED:  MARCAINE    and LIDOCAINE   SPECIMEN:  Source of Specimen:  sentinel node 1 and 2, right lumpectomy SS/LL  DISPOSITION OF SPECIMEN:  PATHOLOGY  COUNTS:  YES  TOURNIQUET:  * No tourniquets in log *  DICTATION: .Other Dictation: Dictation Number  [id number]  PLAN OF CARE: Discharge to home after PACU  PATIENT DISPOSITION:  PACU - hemodynamically stable.   Delay start of Pharmacological VTE agent (>24hrs) due to surgical blood loss or risk of bleeding: no

## 2012-03-18 ENCOUNTER — Encounter: Payer: Self-pay | Admitting: *Deleted

## 2012-03-18 NOTE — Progress Notes (Signed)
Ordered Oncotype Dx test w/ [facility].  Faxed request to Path. 

## 2012-03-20 ENCOUNTER — Ambulatory Visit (INDEPENDENT_AMBULATORY_CARE_PROVIDER_SITE_OTHER): Payer: Medicare Other | Admitting: General Surgery

## 2012-03-20 DIAGNOSIS — Z4889 Encounter for other specified surgical aftercare: Secondary | ICD-10-CM

## 2012-03-20 DIAGNOSIS — Z5189 Encounter for other specified aftercare: Secondary | ICD-10-CM

## 2012-03-20 NOTE — Progress Notes (Signed)
Subjective:     Patient ID: Crystal [name], female   DOB: Aug 08, 1946, 66 y.o.   MRN: [id number]  HPI F/u s/p lumpectomy with sln bx. No complaints. Path with 1.5cm tumor and positive posterior margin.  Nodes negative.  No fevers or chills.  Review of Systems     Objective:   Physical Exam Her incision is healing well without sign of infection. She does have some swelling in her right breast and likely seroma. There is no erythema or sign of infection at this time. She has an appropriate amount of tenderness.    Assessment:     S/p right lumpectomy with SLN bx.  Though it is surely reassuring that her nodes are negative, her posterior margin was grossly positive despite removal of the rectus fascia.  There was no evidence of invasion of the fascia at that time but I commented at the time of surgery of the inability to obtain any further posterior margin if positive.  I have discussed this with Dr. Mitzi Hansen and he will plan on boosting the area.  Also we will discuss her again in our breast conference.  I am not sure that I can offer her much else for surgery.  I explained that she will be higher risk for local recurrence and she may need to be followed closely with MRI since the positive margin was on the muscle.      Plan:     I will represent her in breast conference but we will plan for radiation and adjuvant therapies per oncology.  She has a seroma but appears uninfected currently. F/u in 1 month to recheck her wound and to ensure that she is getting her adjuvant treatments.

## 2012-03-25 DIAGNOSIS — C50919 Malignant neoplasm of unspecified site of unspecified female breast: Secondary | ICD-10-CM | POA: Diagnosis not present

## 2012-03-26 ENCOUNTER — Ambulatory Visit: Payer: Medicare Other | Admitting: Oncology

## 2012-03-26 ENCOUNTER — Encounter: Payer: Self-pay | Admitting: *Deleted

## 2012-03-26 NOTE — Progress Notes (Signed)
Received Oncotype dx results of 23.  Gave copy to MD.  Rochele Pages copy to Med Rec to scan.

## 2012-03-27 ENCOUNTER — Encounter: Payer: Self-pay | Admitting: *Deleted

## 2012-03-28 ENCOUNTER — Ambulatory Visit
Admission: RE | Admit: 2012-03-28 | Discharge: 2012-03-28 | Disposition: A | Discharge status: Home / Self Care | Payer: Medicare Other | Source: Ambulatory Visit | Attending: Radiation Oncology | Admitting: Radiation Oncology

## 2012-03-28 ENCOUNTER — Encounter: Payer: Self-pay | Admitting: Radiation Oncology

## 2012-03-28 VITALS — BP 142/86 | HR 84 | Temp 97.5°F | Resp 18 | Ht 62.0 in | Wt 106.4 lb

## 2012-03-28 DIAGNOSIS — C50319 Malignant neoplasm of lower-inner quadrant of unspecified female breast: Secondary | ICD-10-CM | POA: Diagnosis not present

## 2012-03-28 DIAGNOSIS — Z510 Encounter for antineoplastic radiation therapy: Secondary | ICD-10-CM | POA: Insufficient documentation

## 2012-03-28 DIAGNOSIS — C50919 Malignant neoplasm of unspecified site of unspecified female breast: Secondary | ICD-10-CM | POA: Insufficient documentation

## 2012-03-28 NOTE — Progress Notes (Signed)
Encounter addended by: Delynn Flavin, RN on: 03/28/2012  7:37 PM<BR>     Documentation filed: Charges VN

## 2012-03-28 NOTE — Progress Notes (Signed)
HERE TODAY FOR CONSULT OF RIGHT BREAST.  ER POS,   PR POS,   HAS HAD LUMPECTOMY.  SAYS THAT THE BREAST IS SWOLLEN AND FEELS TIGHT AND TENDER TOUCH.      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

## 2012-03-29 ENCOUNTER — Other Ambulatory Visit: Payer: Self-pay | Admitting: Oncology

## 2012-03-29 NOTE — Progress Notes (Signed)
Radiation Oncology         [phone] ________________________________  Name: Crystal [name] MRN: [id number]  Date: 03/29/2012  DOB: 1945-12-25  Follow-Up Visit Note  CC: Illene Regulus, MD, MD  Lodema Pilot, DO  Diagnosis:   Invasive lobular carcinoma of the right breast  Interval Since Last Radiation:  Not applicable   Narrative:  The patient returns today for routine follow-up.  The patient was initially seen in breast clinic. Since that time she has proceeded with a right-sided lumpectomy which was completed on 03/11/2012. Both sentinel lymph nodes were negative. The lumpectomy specimen revealed an invasive lobular carcinoma which was grade 1. This and 1.5 cm. It was felt that the invasive carcinoma was broadly present at the posterior resection margin. Receptor studies have indicated that the tumor is ER and PR positive as well as HER-2/neu negative. This represented a T1 C. N0 tumor. The patient states that she has had some fluid buildup and some swelling in the right breast area but otherwise has done well. She has seen a surgeon to discuss her surgical results and she presents today for discussion of adjuvant radiation treatment. She is also due for followup to discuss chemotherapy within the next couple of weeks.                              ALLERGIES:   has no known allergies.  Meds: Current Outpatient Prescriptions  Medication Sig Dispense Refill  . amLODipine (NORVASC) 5 MG tablet Take 5 mg by mouth daily.      Marland Kitchen aspirin 325 MG EC tablet Take 325 mg by mouth daily.      . mesalamine (CANASA) 1000 MG suppository Place 1,000 mg rectally at bedtime as needed.      . metoCLOPramide (REGLAN) 10 MG/10ML SOLN Take 10 mg by mouth.      . polyethylene glycol (MIRALAX / GLYCOLAX) packet Take 17 g by mouth 3 (three) times a week.      . Senna-Psyllium (PERDIEM PO) Take 2 tablets by mouth at bedtime.        Physical Findings: The patient is in no acute distress. Patient is alert  and oriented.  height is 5\' 2"  (1.575 m) and weight is 106 lb 6.4 oz (48.263 kg). Her oral temperature is 97.5 F (36.4 C). Her blood pressure is 142/86 and her pulse is 84. Her respiration is 18. .   General: Well-developed, in no acute distress HEENT: Normocephalic, atraumatic; oral cavity clear Neck: Supple without any lymphadenopathy Cardiovascular: Regular rate and rhythm Respiratory: Clear to auscultation bilaterally Breast: Right breast incision looks good as does the axillary incision. There is a significant seroma present with some tenderness. No sign of infection. GI: Soft, nontender, normal bowel sounds Extremities: No edema present Neuro: No focal deficits    Lab Findings: Lab Results  Component Value Date   WBC 5.5 03/03/2012   HGB 12.7 03/03/2012   HCT 37.2 03/03/2012   MCV 93.0 03/03/2012   PLT 300 03/03/2012     Radiographic Findings: Dg Chest 2 View  03/03/2012  *RADIOLOGY REPORT*  Clinical Data: Preop breast mass  CHEST - 2 VIEW  Comparison: None.  Findings: Heart size and vascularity are normal.  Mild hyperinflation.  Lungs are clear without infiltrate or mass lesion. Apical scarring bilaterally.  Surgical clips in the left paratracheal thyroid region.  IMPRESSION: No active cardiopulmonary disease.  Chronic lung disease.  Original Report  Authenticated By: Camelia Phenes, M.D.   Nm Sentinel Node Inj-no Rpt (breast)  03/11/2012  CLINICAL DATA: right breast cancer   Sulfur colloid was injected intradermally by the nuclear medicine  technologist for breast cancer sentinel node localization.     [place] Wire Localization Right  03/11/2012  *RADIOLOGY REPORT*  Clinical Data:  Right breast cancer  RIGHT BREAST NEEDLE LOCALIZATION USING ULTRASOUND GUIDANCE AND SPECIMEN RADIOGRAPH  Patient presents for needle localization prior to surgical excision. The patient and I discussed the procedure of needle localization including benefits and alternatives. We discussed the high likelihood  of a successful procedure. We discussed the risks of the procedure, including infection, bleeding, tissue injury and further surgery. Informed written consent was given.  Using ultrasound guidance, sterile technique, 2% lidocaine and a 7 cm Ultrawire needle, the mass at 5 o'clock just above the right inframammary fold was localized using a mediolateral approach. Films were labeled and sent with the patient to surgery. She tolerated the procedure well.  Specimen radiograph is performed at [facility] operating room and confirms the mass, clip and wire to be present in the tissue sample.  The specimen is marked for pathology.  IMPRESSION: Needle localization right breast.  No apparent complications.  Original Report Authenticated By: Daryl Eastern, M.D.   Mm Breast Surgical Specimen  03/11/2012  *RADIOLOGY REPORT*  Clinical Data:  Right breast cancer  RIGHT BREAST NEEDLE LOCALIZATION USING ULTRASOUND GUIDANCE AND SPECIMEN RADIOGRAPH  Patient presents for needle localization prior to surgical excision. The patient and I discussed the procedure of needle localization including benefits and alternatives. We discussed the high likelihood of a successful procedure. We discussed the risks of the procedure, including infection, bleeding, tissue injury and further surgery. Informed written consent was given.  Using ultrasound guidance, sterile technique, 2% lidocaine and a 7 cm Ultrawire needle, the mass at 5 o'clock just above the right inframammary fold was localized using a mediolateral approach. Films were labeled and sent with the patient to surgery. She tolerated the procedure well.  Specimen radiograph is performed at [facility] operating room and confirms the mass, clip and wire to be present in the tissue sample.  The specimen is marked for pathology.  IMPRESSION: Needle localization right breast.  No apparent complications.  Original Report Authenticated By: Daryl Eastern, M.D.     Impression:    Pleasant 66 year old female status post lumpectomy for her right-sided breast cancer  Plan:  The patient is an appropriate candidate to proceed with adjuvant radiation treatment. She is to make a final decision within the next couple of weeks regarding possible chemotherapy. She was initially felt to be at low risk for this and I believe that an Oncotype test is pending. I discussed with her going ahead and scheduling her tentatively for a simulation after this upcoming visit. Certainly this can be canceled depending on what decision is made regarding chemotherapy. I also discussed with her in detail the issue of the positive margin. The surgeon that the resection down to the fascia and there was not any concerns for muscle invasion on initial workup and this appears to be a small grade 1 tumor. I therefore don't believe that being more aggressive in terms of resection would offer enough benefit to justify this. I discussed therefore boosting this area with radiation.  I spent 30 minutes with the patient today, the majority of which was spent counseling the patient on the diagnosis of cancer and coordinating care.  Jodelle Gross, M.D., Ph.D.

## 2012-03-29 NOTE — Progress Notes (Signed)
Encounter addended by: Jonna Coup, MD on: 03/29/2012  8:08 AM<BR>     Documentation filed: Notes Section

## 2012-04-02 ENCOUNTER — Ambulatory Visit (INDEPENDENT_AMBULATORY_CARE_PROVIDER_SITE_OTHER): Payer: Medicare Other | Admitting: General Surgery

## 2012-04-02 ENCOUNTER — Encounter (INDEPENDENT_AMBULATORY_CARE_PROVIDER_SITE_OTHER): Payer: Self-pay | Admitting: General Surgery

## 2012-04-02 VITALS — BP 130/88 | HR 88 | Temp 97.8°F | Resp 14 | Ht 62.0 in | Wt 105.8 lb

## 2012-04-02 DIAGNOSIS — Z4889 Encounter for other specified surgical aftercare: Secondary | ICD-10-CM

## 2012-04-02 DIAGNOSIS — Z5189 Encounter for other specified aftercare: Secondary | ICD-10-CM

## 2012-04-02 NOTE — Progress Notes (Signed)
Subjective:     Patient ID: Crystal [name], female   DOB: 21-Mar-1946, 66 y.o.   MRN: [id number]  HPI Status post right partial mastectomy and sentinel lymph node biopsy. She has had some firmness and swelling of her right breast consistent with a possible hematoma or seroma. Saturday she called him and spoke with Dr. Johna Sheriff stating that she had some pressure and "throbbing" in the area of an occult and some doxycycline. She's been taking this for a week and says that she has been feeling better and denies any fevers or chills.  Review of Systems     Objective:   Physical Exam Her incision is healing well. She has good cosmesis. She does have what appears to be a palpable hematoma or seroma in the area under nipple and in the area of the lumpectomy cavity. There is some yellowing of the skin consistent with resolving bruising. I do not see any cellulitis or sign of active infection.    Assessment:     Status post right lumpectomy with sentinel lymph node biopsy with postoperative hematoma/seroma This does not appear to be an infected hematoma or seroma and seems to be improving over the last week. She is at risk for infection but again, no signs of this currently. If this does not continue to improve, then we will consider aspiration of of this fluid collection. She is in need of radiation and we will like to get this resolved as soon as possible so she can begin her adjuvant therapies.    Plan:     This fluid collection appears to be improving and if this does not continue to improve over the next 2 weeks then we will consider aspiration. She will follow up with Dr. Mitzi Hansen and Dr. Darnelle Catalan for adjuvant therapies. I will see her back in 2 weeks.

## 2012-04-06 ENCOUNTER — Other Ambulatory Visit: Payer: Self-pay | Admitting: Internal Medicine

## 2012-04-08 ENCOUNTER — Ambulatory Visit (HOSPITAL_BASED_OUTPATIENT_CLINIC_OR_DEPARTMENT_OTHER): Payer: Medicare Other | Admitting: Oncology

## 2012-04-08 ENCOUNTER — Telehealth: Payer: Self-pay | Admitting: *Deleted

## 2012-04-08 VITALS — BP 143/82 | HR 91 | Temp 98.2°F | Ht 62.0 in | Wt 105.0 lb

## 2012-04-08 DIAGNOSIS — E559 Vitamin D deficiency, unspecified: Secondary | ICD-10-CM

## 2012-04-08 DIAGNOSIS — C50319 Malignant neoplasm of lower-inner quadrant of unspecified female breast: Secondary | ICD-10-CM

## 2012-04-08 NOTE — Telephone Encounter (Signed)
MADE PATIENT APPOINTMENT FOR BONE DENSITY  AT [facility] ON 05-29-2012 AT 9:30AM MADE PATIENT APPOINTMENT FOR 06-04-2012 LABS 06-11-2012 MD 9:30AM PRINTED OUT CALENDAR AND GAVE TO THE PATIENT

## 2012-04-08 NOTE — Progress Notes (Signed)
ID: [name]   DOB: 12-21-1945  MR#: [id number]  CSN#:[id number]  HISTORY OF PRESENT ILLNESS: The patient saw Dr. Huel Cote for routine gynecologic followup and she noted a palpable mass in the right breast she said Crystal [name] up for right mammography and ultrasonography, performed at the breast center 01/29/2012. The breasts were heterogeneously dense. It mass with dense calcification was noted centrally in the right breast consistent with a benign fibroadenoma. The new palpable mass was not well demonstrated by mammography. It was firm, mobile, and measured approximately 1 cm by palpation. Ultrasound showed an irregularly marginated hypoechoic mass in this area, measuring 1.1 cm.  Biopsy was performed the same day, and showed [phone]) an invasive lobular breast cancer, e-cadherin negative,grade 1, estrogen and progesterone receptor positive, both of 100%, with an MIB-1 of 14%, and no HER-2 amplification.  The patient underwent a left diagnostic mammography 01/30/2012 and bilateral breast MRI 0415/2013.there were no other areas of concern in either breast, and there was no adenopathy noted. By MRI of the right breast mass measured 1.4 cm. Her subsequent history is as detailed below.  INTERVAL HISTORY: Crystal [name] returns today with a friend for followup of her breast cancer. Since the last visit here as she underwent definitive surgery. She did well except for a postoperative infection, and some swelling. She still having some pain, but not taking any analgesics. She has not yet resumed her usual exercise routine.  REVIEW OF SYSTEMS: Aside from postoperative issues, she is sleeping poorly, waking up many times about 4 AM. Her cat is sometimes to blame for this. She describes herself as mildly fatigued. She has a runny nose, poor appetite, some urinary leakage, easy bruising, and some forgetfulness and anxiety. A detailed review of systems was otherwise noncontributory.  PAST MEDICAL  HISTORY: Past Medical History  Diagnosis Date  . Abdominal aortic ectasia 01/31/2010    Qualifier: Diagnosis of  By: Crystal [name], Crystal [name] ABDOMINAL PAIN RIGHT UPPER QUADRANT 11/07/2010    Qualifier: Diagnosis of  By: Crystal Motto [name] Crystal [name] CEREBROVASCULAR DISEASE 06/16/2010    Qualifier: Diagnosis of  By: Crystal Ruiz [name], Crystal [name]   . CONSTIPATION 01/12/2009    Qualifier: Diagnosis of  By: Crystal [name] CMA (AAMA), Crystal [name]    . Cough 08/15/2010    Qualifier: Diagnosis of  By: Crystal Motto [name] Crystal [name] DETRUSOR, OVERACTIVE 03/02/2010    Qualifier: Diagnosis of  By: Crystal [name], Crystal [name] ESOPHAGEAL STRICTURE 01/12/2009    Qualifier: Diagnosis of  By: Crystal [name] CMA (AAMA), Crystal [name]    . EXTERNAL HEMORRHOIDS 01/12/2009    Qualifier: Diagnosis of  By: Crystal [name] CMA (AAMA), Crystal [name]    . GERD 05/10/2007    Qualifier: Diagnosis of  By: Crystal [name] RMA, Crystal [name]    . HYPERLIPIDEMIA 05/10/2007    Qualifier: Diagnosis of  By: Crystal [name], Crystal [name]    . HYPERTENSION 05/10/2007    Qualifier: Diagnosis of  By: Crystal [name], Crystal [name]    . HYPOTENSION, ORTHOSTATIC 06/16/2010    Qualifier: Diagnosis of  By: Crystal Ruiz [name], Crystal [name]   . Irritable bowel syndrome 01/12/2009    Qualifier: Diagnosis of  By: Crystal [name] CMA (AAMA), Crystal [name]    . MELANOSIS COLI 02/16/2009    Qualifier: Diagnosis of  By: Crystal [name] CMA (AAMA), Crystal [name]    . PERIPHERAL VASCULAR DISEASE 06/17/2010    Qualifier: Diagnosis of  By: Crystal Ruiz [name], Crystal [name]   . RECTAL FISSURE 02/16/2009  Qualifier: Diagnosis of  By: Crystal [name] CMA (AAMA), Crystal [name]    . STENOSIS, RECTAL 02/16/2009    Qualifier: Diagnosis of  By: Crystal [name] CMA (AAMA), Crystal [name]    . SYNCOPE 04/19/2008    Qualifier: Diagnosis of  By: Crystal [name], Crystal [name]   . TACHYARRHYTHMIA 09/03/2009    Qualifier: Diagnosis of  By: Crystal [name], Crystal [name] TRANSIENT ISCHEMIC ATTACK 03/02/2010    Qualifier: Diagnosis of  By: Crystal [name], Crystal [name] VENOUS INSUFFICIENCY, LEGS 07/18/2010    Qualifier: Diagnosis of  By: Crystal [name], Crystal [name]   . Eosinophilic  esophagitis   . Dysphagia   . Orthostatic hypotension   . Overactive detrusor   . Dizziness and giddiness   . Sinusitis acute   . Personal history of other diseases of digestive system   . Anal or rectal pain   . Other malaise and fatigue   . Adjustment disorder with depressed mood   . Routine general medical examination at a health care facility   . Breast cancer 03/11/12    right breast lumpectomy=invasive lobular ca,grade I/III,ER?PR=positive    PAST SURGICAL HISTORY: Past Surgical History  Procedure Date  . Appendectomy   . Abdominal hysterectomy   . Hemmorhoidectomy   . Cardiac catherization   . Esophageal tumor excised; posterior 1986   . Electrocardiogram 10/30/2006  . Edg 04/21/2007  . Breast surgery     left breast x 2  . Cardiac catheterization     FAMILY HISTORY Family History  Problem Relation Age of Onset  . Bone cancer Father   . Cancer Father     Bone  . Heart disease Brother   . Colon cancer Neg Hx   . Breast cancer Neg Hx   . Diabetes Neg Hx   . Anesthesia problems Neg Hx   The patient's father died from "bone cancer" at the age of 46. The patient's mother died in an automobile accident at the age of 35. The patient had one brother who died from a heart attack at the age of 79. The patient had no sisters.  GYNECOLOGIC HISTORY: Menarche age 22, she is GX P1 with first live birth at age 58. She underwent to the change of life approximately 1978. She did not take hormone replacement.  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 Her husband died in a plane crash approximately 5 years ago. Her son, Sitara Cashwell, 37, works in [employer] as an Personnel officer. The patient has no grandchildren. She lives alone with her cat Nicholaus Bloom.  ADVANCED DIRECTIVES: in place per Dr Crystal [name]' note  HEALTH MAINTENANCE: History  Substance Use Topics  . Smoking status: Former Smoker    Quit date: 10/22/1965  . Smokeless tobacco: Never Used  . Alcohol  Use: 1.8 oz/week    3 Glasses of wine per week     occasional      Colonoscopy: 2010  PAP: 2011  Bone density: 2011/ osteopenia  Lipid panel: per Dr Crystal [name]  No Known Allergies  Current Outpatient Prescriptions  Medication Sig Dispense Refill  . amLODipine (NORVASC) 5 MG tablet Take 5 mg by mouth daily.      Marland Kitchen aspirin 325 MG EC tablet Take 325 mg by mouth daily.      . mesalamine (CANASA) 1000 MG suppository Place 1,000 mg rectally at bedtime as needed.      . polyethylene glycol (MIRALAX / GLYCOLAX) packet Take 17 g by mouth 3 (  three) times a week.      . Senna-Psyllium (PERDIEM PO) Take 2 tablets by mouth at bedtime.      Marland Kitchen DISCONTD: amLODipine (NORVASC) 5 MG tablet TAKE ONE TABLET BY MOUTH ONE TIME DAILY  30 tablet  5    OBJECTIVE: elderly white woman in no acute distress Filed Vitals:   04/08/12 1014  BP: 143/82  Pulse: 91  Temp: 98.2 F (36.8 C)     Body mass index is 19.20 kg/(m^2).    ECOG FS:1  Sclerae unicteric Oropharynx clear No cervical or supraclavicular adenopathy Lungs no rales or rhonchi Heart irregularly irregular rate and rhythm. No murmur appreciated Abd soft, nontender, positive bowel sounds MSK no focal spinal tenderness, no peripheral edema Neuro: nonfocal Breasts: The right breast is status post recent lumpectomy. There is a minimal pink blush in the anterior aspect of the breast, and some firmness in the inferior aspect of the breast suggestive of this aromatase there is no nipple inversion. There are no skin changes of concern. There is no evidence of local recurrence. The left breast is unremarkable  LAB RESULTS: Lab Results  Component Value Date   WBC 5.5 03/03/2012   NEUTROABS 6.2 02/06/2012   HGB 12.7 03/03/2012   HCT 37.2 03/03/2012   MCV 93.0 03/03/2012   PLT 300 03/03/2012      Chemistry      Component Value Date/Time   NA 137 03/03/2012 0936   K 4.3 03/03/2012 0936   CL 99 03/03/2012 0936   CO2 27 03/03/2012 0936   BUN 11 03/03/2012 0936    CREATININE 0.80 03/03/2012 0936      Component Value Date/Time   CALCIUM 10.2 03/03/2012 0936   ALKPHOS 93 02/06/2012 1217   AST 22 02/06/2012 1217   ALT 13 02/06/2012 1217   BILITOT 1.0 02/06/2012 1217       Lab Results  Component Value Date   LABCA2 10 02/06/2012    No components found with this basename: [id number]    No results found for this basename: INR:1;PROTIME:1 in the last 168 hours  Urinalysis    Component Value Date/Time   COLORURINE LT. YELLOW 01/27/2009 0833   APPEARANCEUR CLEAR 01/27/2009 0833   LABSPEC 1.025 01/27/2009 0833   PHURINE 5.5 01/27/2009 0833   BILIRUBINUR NEGATIVE 01/27/2009 0833   KETONESUR NEGATIVE 01/27/2009 0833   UROBILINOGEN 0.2 01/27/2009 0833   NITRITE NEGATIVE 01/27/2009 0833   LEUKOCYTESUR NEGATIVE 01/27/2009 0833    STUDIES: FINAL DIAGNOSIS Diagnosis 1. Lymph node, sentinel, biopsy, right #1 - THERE IS NO EVIDENCE OF CARCINOMA IN 1 OF 1 LYMPH NODE (0/1). - SEE COMMENT. 1 of 3 FINAL for Mihelich, CHARLOTTE C [phone]) Diagnosis(continued) 2. Lymph node, sentinel, biopsy, right #2 - THERE IS NO EVIDENCE OF CARCINOMA IN 1 OF 1 LYMPH NODE (0/1). - SEE COMMENT. 3. Breast, lumpectomy, right - INVASIVE LOBULAR CARCINOMA, GRADE I/III, SPANNING 1.5 CM. - ATYPICAL LOBULAR HYPERPLASIA. - INVASIVE CARCINOMA IS BROADLY PRESENT AT THE POSTERIOR RESECTION MARGIN. - SEE ONCOLOGY TABLE BELOW. Microscopic Comment 3. BREAST, INVASIVE TUMOR, WITH LYMPH NODE SAMPLING Specimen, including laterality: Right breast Procedure: Lumpectomy Grade: 1 Tubule formation: 3 Nuclear pleomorphism: 1 Mitotic:1 Tumor size (gross measurement): 1.5 cm Margins: Invasive, [name] to closest margin: Broadly present at the posterior resection margin Lymphovascular invasion: Not identified Ductal carcinoma in situ: Not identified Tumor focality: Unifocal Treatment effect: N/A Extent of tumor: Confined to breast parenchyma Lymph nodes: # examined: 2 Lymph nodes with  metastasis: 0 Breast  prognostic profile: [phone] Estrogen receptor: 100%, strong staining intensity Progesterone receptor: 100%, strong staining intensity Her 2 neu: No amplification was detected. The ratio was 1.31. Her2 will be repeated on the current case and the results reported separately. Ki-67: 14% Non-neoplastic breast: Intraductal papilloma with usual ductal hyperplasia, healing biopsy site TNM: pT1c, pN0  ASSESSMENT: 66 year old [place] woman status post right lumpectomy 03/11/2012 for a pT1c N0, stage IA, invasive lobular breast cancer, grade 1, with a broadly positive margin which according to surgery could not be cleared short of mastectomy. The tumor is strongly estrogen and progesterone receptor positive, with a low MIB and no evidence of HER-2 amplification. The Oncotype recurrence score is 23, predicting a 15% chance of distant recurrence with adjuvant tamoxifen. The Adjuvant! Program predicts a risk of recurrence of 17%, dropping to 9% with aromatase inhibitors.  PLAN: We discussed her situation in detail and she has a good understanding of the issues involved. Her case was discussed at the multidisciplinary breast cancer conference 03/26/2012, and the consensus there was to rely on radiation therapy to improve the risk of local recurrence. She will benefit from antiestrogens, and if she takes aromatase inhibitors the final risk of recurrence will be better than that predicted by the Oncotype program. I calculate that she can get between 1% and 4% risk reduction with chemotherapy These numbers are not a motivator for her.  I am comfortable with forgoing chemotherapy in her case particularly since her tumor is lobular, and she understands the databases quoted above are more than 80% based on ductal tumors. We do know that lobular ones do not respond as well to chemotherapy as ductals.  Accordingly she is ready to start her radiation treatments. She will see me again in  mid-to-late August. We will repeat a bone density prior to that visit. Today I also gave her written information on anastrozole and tamoxifen. My recommendation will be that we go with anastrozole and if there is a real concern regarding bone density issues, add zoledronic acid yearly. She knows to call for any problems that may develop before the next visit Mariem Skolnick C    04/08/2012

## 2012-04-08 NOTE — Telephone Encounter (Signed)
Gave patient appointment for 06-04-2012 lab only 06-11-2012 md at 9:30am made patient an bone density appointment for 05-29-2012 at th breast center

## 2012-04-10 ENCOUNTER — Ambulatory Visit
Admission: RE | Admit: 2012-04-10 | Discharge: 2012-04-10 | Disposition: A | Discharge status: Home / Self Care | Payer: Medicare Other | Source: Ambulatory Visit | Attending: Radiation Oncology | Admitting: Radiation Oncology

## 2012-04-10 DIAGNOSIS — C50919 Malignant neoplasm of unspecified site of unspecified female breast: Secondary | ICD-10-CM | POA: Diagnosis not present

## 2012-04-10 DIAGNOSIS — Z510 Encounter for antineoplastic radiation therapy: Secondary | ICD-10-CM | POA: Diagnosis not present

## 2012-04-10 DIAGNOSIS — C50319 Malignant neoplasm of lower-inner quadrant of unspecified female breast: Secondary | ICD-10-CM | POA: Diagnosis not present

## 2012-04-10 NOTE — Progress Notes (Signed)
Met with patient to discuss RO billing.  Dx:  Lower-inner quadrant of breast  Attending Rad: Dr. Sanjuana Letters Tx: [id number] Extrl Beam

## 2012-04-12 NOTE — Progress Notes (Signed)
  Radiation Oncology         (336) [phone] ________________________________  Name: Crystal [name] MRN: [id number]  Date: 04/10/2012  DOB: 07/06/1946  SIMULATION AND TREATMENT PLANNING NOTE  The patient presented for simulation prior to beginning her course of radiation treatment for her diagnosis of right-sided breast cancer. The patient was placed in a supine position on a breast board. A customized accuform device was also constructed and this complex treatment device will be used on a daily basis during her treatment. In this fashion, a CT scan was obtained through the chest area and an isocenter was placed near the chest wall within the right breast.  The patient will be planned to receive a course of radiation initially to a dose of 50.4 gray. This will consist of a whole breast radiotherapy technique. To accomplish this, 2 customized blocks have been designed which will correspond to medial and lateral whole breast tangent fields. This treatment will be accomplished at 1.8 gray per fraction. A complex isodose plan is requested to ensure that the breast target area is adequately covered dosimetrically. A forward planning technique will also be evaluated to determine if this approach improves the plan. It is anticipated that the patient will then receive a 15.6 gray boost to the seroma cavity which has been contoured. This will be accomplished at 1.95 gray per fraction. The final anticipated total dose therefore will correspond to 66 gray.   _______________________________   Radene Gunning, MD, PhD

## 2012-04-14 ENCOUNTER — Encounter: Payer: Self-pay | Admitting: *Deleted

## 2012-04-14 NOTE — Progress Notes (Signed)
[facility] Psychosocial Distress Screening Clinical Social Work  Clinical Social Work was referred by distress screening protocol.  The patient scored a 7 on the Psychosocial Distress Thermometer which indicates moderate distress. Clinical Social Worker contacted pt at home to assess for distress and other psychosocial needs.  Pt stated she was doing well and experiencing no distress at this time.  Pt stated she was experiencing some fatigue, but felt her energy was gradually increasing.  CSW informed pt of the support team and supportive services at [facility].  Pt stated she was very pleased with the care and support she has received at [facility].  CSW encouraged pt to call with any needs or concerns.      Clinical Social Worker follow up needed:not at this time  Tamala Julian, MSW, LCSW Clinical Social Worker [facility] [phone]

## 2012-04-17 ENCOUNTER — Ambulatory Visit
Admission: RE | Admit: 2012-04-17 | Discharge: 2012-04-17 | Disposition: A | Discharge status: Home / Self Care | Payer: Medicare Other | Source: Ambulatory Visit | Attending: Radiation Oncology | Admitting: Radiation Oncology

## 2012-04-17 DIAGNOSIS — C50919 Malignant neoplasm of unspecified site of unspecified female breast: Secondary | ICD-10-CM | POA: Diagnosis not present

## 2012-04-17 DIAGNOSIS — C50319 Malignant neoplasm of lower-inner quadrant of unspecified female breast: Secondary | ICD-10-CM | POA: Diagnosis not present

## 2012-04-17 DIAGNOSIS — Z510 Encounter for antineoplastic radiation therapy: Secondary | ICD-10-CM | POA: Diagnosis not present

## 2012-04-17 NOTE — Progress Notes (Signed)
  Radiation Oncology         (336) [phone] ________________________________  Name: Crystal [name] MRN: [id number]  Date: 04/17/2012  DOB: [birth year]  Simulation Verification Note   NARRATIVE: The patient was brought to the treatment unit and placed in the planned treatment position. The clinical setup was verified. Then port films were obtained and uploaded to the radiation oncology medical record software.  The treatment beams were carefully compared against the planned radiation fields. The position, location, and shape of the radiation fields was reviewed. The targeted volume of tissue appears to be appropriately covered by the radiation beams. Based on my personal review, I approved the simulation verification. The patient's treatment will proceed as planned.  ________________________________   Radene Gunning, MD, PhD

## 2012-04-18 ENCOUNTER — Encounter (INDEPENDENT_AMBULATORY_CARE_PROVIDER_SITE_OTHER): Payer: Medicare Other | Admitting: General Surgery

## 2012-04-21 ENCOUNTER — Ambulatory Visit
Admission: RE | Admit: 2012-04-21 | Discharge: 2012-04-21 | Disposition: A | Discharge status: Home / Self Care | Payer: Medicare Other | Source: Ambulatory Visit | Attending: Radiation Oncology | Admitting: Radiation Oncology

## 2012-04-21 DIAGNOSIS — Z510 Encounter for antineoplastic radiation therapy: Secondary | ICD-10-CM | POA: Diagnosis not present

## 2012-04-21 DIAGNOSIS — C50319 Malignant neoplasm of lower-inner quadrant of unspecified female breast: Secondary | ICD-10-CM | POA: Diagnosis not present

## 2012-04-21 DIAGNOSIS — C50919 Malignant neoplasm of unspecified site of unspecified female breast: Secondary | ICD-10-CM | POA: Diagnosis not present

## 2012-04-21 MED ORDER — ALRA NON-METALLIC DEODORANT (RAD-ONC)
1.0000 "application " | Freq: Once | TOPICAL | Status: AC
  Administered 2012-04-21: 1 via TOPICAL

## 2012-04-21 MED ORDER — RADIAPLEXRX EX GEL
Freq: Once | CUTANEOUS | Status: AC
  Administered 2012-04-21: 10:00:00 via TOPICAL

## 2012-04-21 NOTE — Progress Notes (Signed)
Patient given radiation therapy and you book, my business card, alra deodorant,radiaplex gel   Skin products and flyer on skin products, scheduled calendar printed also and given to patient, discusses sign/symptoms to report, will see MD weekly/prn,patient gave teach back understanding,no c/o pain 1 9:52 AM st  Rad treatment breast  today

## 2012-04-22 ENCOUNTER — Ambulatory Visit
Admission: RE | Admit: 2012-04-22 | Discharge: 2012-04-22 | Disposition: A | Discharge status: Home / Self Care | Payer: Medicare Other | Source: Ambulatory Visit | Attending: Radiation Oncology | Admitting: Radiation Oncology

## 2012-04-22 DIAGNOSIS — Z510 Encounter for antineoplastic radiation therapy: Secondary | ICD-10-CM | POA: Diagnosis not present

## 2012-04-22 DIAGNOSIS — C50919 Malignant neoplasm of unspecified site of unspecified female breast: Secondary | ICD-10-CM | POA: Diagnosis not present

## 2012-04-23 ENCOUNTER — Ambulatory Visit
Admission: RE | Admit: 2012-04-23 | Discharge: 2012-04-23 | Disposition: A | Discharge status: Home / Self Care | Payer: Medicare Other | Source: Ambulatory Visit | Attending: Radiation Oncology | Admitting: Radiation Oncology

## 2012-04-23 DIAGNOSIS — Z510 Encounter for antineoplastic radiation therapy: Secondary | ICD-10-CM | POA: Diagnosis not present

## 2012-04-23 DIAGNOSIS — C50919 Malignant neoplasm of unspecified site of unspecified female breast: Secondary | ICD-10-CM | POA: Diagnosis not present

## 2012-04-25 ENCOUNTER — Ambulatory Visit
Admission: RE | Admit: 2012-04-25 | Discharge: 2012-04-25 | Disposition: A | Discharge status: Home / Self Care | Payer: Medicare Other | Source: Ambulatory Visit | Attending: Radiation Oncology | Admitting: Radiation Oncology

## 2012-04-25 ENCOUNTER — Encounter: Payer: Self-pay | Admitting: Radiation Oncology

## 2012-04-25 VITALS — BP 152/81 | HR 81 | Temp 97.7°F | Resp 20 | Wt 104.8 lb

## 2012-04-25 DIAGNOSIS — C50919 Malignant neoplasm of unspecified site of unspecified female breast: Secondary | ICD-10-CM | POA: Diagnosis not present

## 2012-04-25 DIAGNOSIS — C50319 Malignant neoplasm of lower-inner quadrant of unspecified female breast: Secondary | ICD-10-CM

## 2012-04-25 DIAGNOSIS — Z510 Encounter for antineoplastic radiation therapy: Secondary | ICD-10-CM | POA: Diagnosis not present

## 2012-04-25 NOTE — Progress Notes (Signed)
   Department of Radiation Oncology  Phone:  [phone] Fax:        [phone]  Weekly Treatment Note    Name: Crystal [name] Date: 04/25/2012 MRN: [id number] DOB: Jul 10, 1946   Current dose: 7.2 Gy  Current fraction: 4   MEDICATIONS: Current Outpatient Prescriptions  Medication Sig Dispense Refill  . amLODipine (NORVASC) 5 MG tablet Take 5 mg by mouth daily.      Marland Kitchen aspirin 325 MG EC tablet Take 325 mg by mouth daily.      . mesalamine (CANASA) 1000 MG suppository Place 1,000 mg rectally at bedtime as needed.      . polyethylene glycol (MIRALAX / GLYCOLAX) packet Take 17 g by mouth 3 (three) times a week.      . Senna-Psyllium (PERDIEM PO) Take 2 tablets by mouth at bedtime.         ALLERGIES: Review of patient's allergies indicates no known allergies.   LABORATORY DATA:  Lab Results  Component Value Date   WBC 5.5 03/03/2012   HGB 12.7 03/03/2012   HCT 37.2 03/03/2012   MCV 93.0 03/03/2012   PLT 300 03/03/2012   Lab Results  Component Value Date   NA 137 03/03/2012   K 4.3 03/03/2012   CL 99 03/03/2012   CO2 27 03/03/2012   Lab Results  Component Value Date   ALT 13 02/06/2012   AST 22 02/06/2012   ALKPHOS 93 02/06/2012   BILITOT 1.0 02/06/2012     NARRATIVE: Crystal [name] was seen today for weekly treatment management. The chart was checked and the patient's films were reviewed. The patient is doing very well. No complaints during her first week of treatment other than some occasional shoulder and elbow aching. She is using skin cream.  PHYSICAL EXAMINATION: weight is 104 lb 12.8 oz (47.537 kg). Her axillary temperature is 97.7 F (36.5 C). Her blood pressure is 152/81 and her pulse is 81. Her respiration is 20.        ASSESSMENT: The patient is doing satisfactorily with treatment.  PLAN: We will continue with the patient's radiation treatment as planned.

## 2012-04-25 NOTE — Progress Notes (Signed)
Patient alert,oriented x3 right breast slight erythema, c/o right elbow to shoulder aches at times, eating poorly, drinks ensure, completed 4 tx of 33, using radiaplex gel bid 9:23 AM

## 2012-04-28 ENCOUNTER — Ambulatory Visit
Admission: RE | Admit: 2012-04-28 | Discharge: 2012-04-28 | Disposition: A | Discharge status: Home / Self Care | Payer: Medicare Other | Source: Ambulatory Visit | Attending: Radiation Oncology | Admitting: Radiation Oncology

## 2012-04-28 DIAGNOSIS — Z510 Encounter for antineoplastic radiation therapy: Secondary | ICD-10-CM | POA: Diagnosis not present

## 2012-04-28 DIAGNOSIS — C50919 Malignant neoplasm of unspecified site of unspecified female breast: Secondary | ICD-10-CM | POA: Diagnosis not present

## 2012-04-29 ENCOUNTER — Ambulatory Visit
Admission: RE | Admit: 2012-04-29 | Discharge: 2012-04-29 | Disposition: A | Discharge status: Home / Self Care | Payer: Medicare Other | Source: Ambulatory Visit | Attending: Radiation Oncology | Admitting: Radiation Oncology

## 2012-04-29 DIAGNOSIS — C50319 Malignant neoplasm of lower-inner quadrant of unspecified female breast: Secondary | ICD-10-CM | POA: Diagnosis not present

## 2012-04-29 DIAGNOSIS — C50919 Malignant neoplasm of unspecified site of unspecified female breast: Secondary | ICD-10-CM | POA: Diagnosis not present

## 2012-04-29 DIAGNOSIS — Z510 Encounter for antineoplastic radiation therapy: Secondary | ICD-10-CM | POA: Diagnosis not present

## 2012-04-30 ENCOUNTER — Encounter: Payer: Self-pay | Admitting: Radiation Oncology

## 2012-04-30 ENCOUNTER — Ambulatory Visit
Admission: RE | Admit: 2012-04-30 | Discharge: 2012-04-30 | Disposition: A | Discharge status: Home / Self Care | Payer: Medicare Other | Source: Ambulatory Visit | Attending: Radiation Oncology | Admitting: Radiation Oncology

## 2012-04-30 ENCOUNTER — Ambulatory Visit
Admission: RE | Admit: 2012-04-30 | Discharge: 2012-04-30 | Disposition: A | Discharge status: Home / Self Care | Payer: Medicare Other | Source: Ambulatory Visit | Admitting: Radiation Oncology

## 2012-04-30 VITALS — BP 159/82 | HR 76 | Temp 97.4°F | Resp 20 | Wt 104.1 lb

## 2012-04-30 DIAGNOSIS — C50919 Malignant neoplasm of unspecified site of unspecified female breast: Secondary | ICD-10-CM | POA: Diagnosis not present

## 2012-04-30 DIAGNOSIS — C50319 Malignant neoplasm of lower-inner quadrant of unspecified female breast: Secondary | ICD-10-CM

## 2012-04-30 DIAGNOSIS — Z510 Encounter for antineoplastic radiation therapy: Secondary | ICD-10-CM | POA: Diagnosis not present

## 2012-04-30 NOTE — Progress Notes (Signed)
Patient arrived  Alert,oriented x3, no skin changes, no erythema noted on right breast, but patient still has heaviness in right arm throughout the day stated,"it feels heavy like it will just fall off,doesn't last long, " TAKES TYLENOL PO PRN AND IT HELPS , eating and drinking ok, slight fatigue in the afternoons stated also, completed 7/36 txs,  9:26 AM

## 2012-04-30 NOTE — Progress Notes (Signed)
Weekly Management Note Current Dose:  12.6 Gy  Projected Dose: 66.0 Gy   Narrative:  The patient presents for routine under treatment assessment.  CBCT/MVCT images/Port film x-rays were reviewed.  The chart was checked. She is doing well with treatment thus far. She denies any itching or discomfort in the breast area. The patient denies any fatigue. She has mild discomfort in the upper arm with position set up. This clears up later in the day.  Physical Findings: Weight: 104 lb 1.6 oz (47.219 kg). No palpable supraclavicular or axillary adenopathy.  The lungs are clear to auscultation. The heart has a regular rhythm and rate. Examination right breast reveals some slight hyperpigmentation changes and erythema. Patient's lumpectomy scar is well-healed.  Impression:  The patient is tolerating radiation well.  Plan:  Continue treatment as planned.   -----------------------------------  Billie Lade, PhD, MD

## 2012-05-01 ENCOUNTER — Ambulatory Visit
Admission: RE | Admit: 2012-05-01 | Discharge: 2012-05-01 | Disposition: A | Discharge status: Home / Self Care | Payer: Medicare Other | Source: Ambulatory Visit | Attending: Radiation Oncology | Admitting: Radiation Oncology

## 2012-05-01 DIAGNOSIS — C50919 Malignant neoplasm of unspecified site of unspecified female breast: Secondary | ICD-10-CM | POA: Diagnosis not present

## 2012-05-01 DIAGNOSIS — Z510 Encounter for antineoplastic radiation therapy: Secondary | ICD-10-CM | POA: Diagnosis not present

## 2012-05-02 ENCOUNTER — Encounter (INDEPENDENT_AMBULATORY_CARE_PROVIDER_SITE_OTHER): Payer: Medicare Other | Admitting: General Surgery

## 2012-05-02 ENCOUNTER — Ambulatory Visit
Admission: RE | Admit: 2012-05-02 | Discharge: 2012-05-02 | Disposition: A | Discharge status: Home / Self Care | Payer: Medicare Other | Source: Ambulatory Visit | Attending: Radiation Oncology | Admitting: Radiation Oncology

## 2012-05-02 DIAGNOSIS — Z510 Encounter for antineoplastic radiation therapy: Secondary | ICD-10-CM | POA: Diagnosis not present

## 2012-05-02 DIAGNOSIS — C50919 Malignant neoplasm of unspecified site of unspecified female breast: Secondary | ICD-10-CM | POA: Diagnosis not present

## 2012-05-05 ENCOUNTER — Ambulatory Visit
Admission: RE | Admit: 2012-05-05 | Discharge: 2012-05-05 | Disposition: A | Discharge status: Home / Self Care | Payer: Medicare Other | Source: Ambulatory Visit | Attending: Radiation Oncology | Admitting: Radiation Oncology

## 2012-05-05 DIAGNOSIS — Z510 Encounter for antineoplastic radiation therapy: Secondary | ICD-10-CM | POA: Diagnosis not present

## 2012-05-05 DIAGNOSIS — C50919 Malignant neoplasm of unspecified site of unspecified female breast: Secondary | ICD-10-CM | POA: Diagnosis not present

## 2012-05-06 ENCOUNTER — Ambulatory Visit: Payer: Medicare Other

## 2012-05-07 ENCOUNTER — Ambulatory Visit (INDEPENDENT_AMBULATORY_CARE_PROVIDER_SITE_OTHER): Payer: Medicare Other | Admitting: General Surgery

## 2012-05-07 ENCOUNTER — Ambulatory Visit
Admission: RE | Admit: 2012-05-07 | Discharge: 2012-05-07 | Disposition: A | Discharge status: Home / Self Care | Payer: Medicare Other | Source: Ambulatory Visit | Attending: Radiation Oncology | Admitting: Radiation Oncology

## 2012-05-07 ENCOUNTER — Encounter (INDEPENDENT_AMBULATORY_CARE_PROVIDER_SITE_OTHER): Payer: Self-pay | Admitting: General Surgery

## 2012-05-07 VITALS — BP 138/81 | HR 94 | Temp 98.4°F | Ht 62.0 in | Wt 103.8 lb

## 2012-05-07 DIAGNOSIS — Z853 Personal history of malignant neoplasm of breast: Secondary | ICD-10-CM | POA: Diagnosis not present

## 2012-05-07 DIAGNOSIS — C50319 Malignant neoplasm of lower-inner quadrant of unspecified female breast: Secondary | ICD-10-CM | POA: Diagnosis not present

## 2012-05-07 DIAGNOSIS — C50919 Malignant neoplasm of unspecified site of unspecified female breast: Secondary | ICD-10-CM | POA: Diagnosis not present

## 2012-05-07 DIAGNOSIS — Z510 Encounter for antineoplastic radiation therapy: Secondary | ICD-10-CM | POA: Diagnosis not present

## 2012-05-07 NOTE — Progress Notes (Signed)
Subjective:     Patient ID: Crystal [name], female   DOB: 07-Jul-1946, 66 y.o.   MRN: [id number]  HPI Patient follows up status post right breast lumpectomy and sentinel lymph node biopsy for a T1 C. N0 right breast cancer. She has no complaints and is doing very well. She is currently in a week 3 of 8 of her radiation. She is tolerating this well. She has not noticed any suspicious changes on her breast exam.  Review of Systems     Objective:   Physical Exam Her right breast is slightly erythematous in the area of the radiation field. There is no sign of infection no. Her incisions are healing well without infection she has cosmesis. She has some firmness in the area of the lumpectomy cavity but no suspicious masses.    Assessment:     Personal history of right breast cancer status post breast conservation therapy for a T1 C. N0 cancer She is currently undergoing radiation therapy and seems to be tolerating this well. She has no complaints and has not noticed any changes on her breast exam. She is going to be high risk for local recurrence given her posterior margin which was positive along the pectoralis muscle. I again emphasized the need  monthly self breast exam for surveillance given her positive margins and her high risk for local recurrence.     Plan:     She looks good today and is tolerating her adjuvant therapies. There is no evidence of recurrence on exam. I recommended that she continue monthly self breast exam and I will see her back in 3 months for repeat evaluation.

## 2012-05-08 ENCOUNTER — Ambulatory Visit
Admission: RE | Admit: 2012-05-08 | Discharge: 2012-05-08 | Disposition: A | Discharge status: Home / Self Care | Payer: Medicare Other | Source: Ambulatory Visit | Attending: Radiation Oncology | Admitting: Radiation Oncology

## 2012-05-08 ENCOUNTER — Encounter: Payer: Self-pay | Admitting: Radiation Oncology

## 2012-05-08 VITALS — BP 147/81 | HR 78 | Temp 97.4°F | Resp 20 | Wt 103.9 lb

## 2012-05-08 DIAGNOSIS — Z510 Encounter for antineoplastic radiation therapy: Secondary | ICD-10-CM | POA: Diagnosis not present

## 2012-05-08 DIAGNOSIS — C50319 Malignant neoplasm of lower-inner quadrant of unspecified female breast: Secondary | ICD-10-CM

## 2012-05-08 DIAGNOSIS — C50919 Malignant neoplasm of unspecified site of unspecified female breast: Secondary | ICD-10-CM | POA: Diagnosis not present

## 2012-05-08 NOTE — Progress Notes (Signed)
Pt applying Radiplex to right breast tx area; has some hyperpigmentation. C/o intermittent achyness in right arm from shoulder to elbow. She states dr is aware.  Pt c/o fatigue, loss of appetite. Suggested nutrition consult, but pt refused at this time.  Pt c/o anxiety,  but denies hopelessness. Pt is widow, son is only family in town. Informed pt there is support staff available to see her, can call and/or see pt while she is here. Pt denied this at this time; reminded pt these services are here for her at any time. Pt verbalized understanding.

## 2012-05-08 NOTE — Progress Notes (Signed)
   Department of Radiation Oncology  Phone:  [phone] Fax:        [phone]  Weekly Treatment Note    Name: Crystal [name] Date: 05/08/2012 MRN: [id number] DOB: 01-20-1946   Current dose: 21.6 Gy  Current fraction: 12   MEDICATIONS: Current Outpatient Prescriptions  Medication Sig Dispense Refill  . amLODipine (NORVASC) 5 MG tablet Take 5 mg by mouth daily.      Marland Kitchen aspirin 325 MG EC tablet Take 325 mg by mouth daily.      . mesalamine (CANASA) 1000 MG suppository Place 1,000 mg rectally at bedtime as needed.      . polyethylene glycol (MIRALAX / GLYCOLAX) packet Take 17 g by mouth 3 (three) times a week.      . Senna-Psyllium (PERDIEM PO) Take 2 tablets by mouth at bedtime.         ALLERGIES: Review of patient's allergies indicates no known allergies.   LABORATORY DATA:  Lab Results  Component Value Date   WBC 5.5 03/03/2012   HGB 12.7 03/03/2012   HCT 37.2 03/03/2012   MCV 93.0 03/03/2012   PLT 300 03/03/2012   Lab Results  Component Value Date   NA 137 03/03/2012   K 4.3 03/03/2012   CL 99 03/03/2012   CO2 27 03/03/2012   Lab Results  Component Value Date   ALT 13 02/06/2012   AST 22 02/06/2012   ALKPHOS 93 02/06/2012   BILITOT 1.0 02/06/2012     NARRATIVE: [name] was seen today for weekly treatment management. The chart was checked and the patient's films were reviewed. The patient is doing well. She is notice some redness in the treatment area. No feeling which is different regarding her skin. Some fatigue. Also does complain of some appetite but she does not wish to see a nutritionist regarding this at this time.  PHYSICAL EXAMINATION: weight is 103 lb 14.4 oz (47.129 kg). Her oral temperature is 97.4 F (36.3 C). Her blood pressure is 147/81 and her pulse is 78. Her respiration is 20.      some diffuse erythema is present which is mild to moderate. No desquamation.  ASSESSMENT: The patient is doing satisfactorily with treatment.  PLAN: We  will continue with the patient's radiation treatment as planned.

## 2012-05-09 ENCOUNTER — Ambulatory Visit
Admission: RE | Admit: 2012-05-09 | Discharge: 2012-05-09 | Disposition: A | Discharge status: Home / Self Care | Payer: Medicare Other | Source: Ambulatory Visit | Attending: Radiation Oncology | Admitting: Radiation Oncology

## 2012-05-09 DIAGNOSIS — Z510 Encounter for antineoplastic radiation therapy: Secondary | ICD-10-CM | POA: Diagnosis not present

## 2012-05-09 DIAGNOSIS — C50919 Malignant neoplasm of unspecified site of unspecified female breast: Secondary | ICD-10-CM | POA: Diagnosis not present

## 2012-05-12 ENCOUNTER — Ambulatory Visit
Admission: RE | Admit: 2012-05-12 | Discharge: 2012-05-12 | Disposition: A | Discharge status: Home / Self Care | Payer: Medicare Other | Source: Ambulatory Visit | Attending: Radiation Oncology | Admitting: Radiation Oncology

## 2012-05-12 DIAGNOSIS — Z510 Encounter for antineoplastic radiation therapy: Secondary | ICD-10-CM | POA: Diagnosis not present

## 2012-05-12 DIAGNOSIS — C50919 Malignant neoplasm of unspecified site of unspecified female breast: Secondary | ICD-10-CM | POA: Diagnosis not present

## 2012-05-13 ENCOUNTER — Ambulatory Visit
Admission: RE | Admit: 2012-05-13 | Discharge: 2012-05-13 | Disposition: A | Discharge status: Home / Self Care | Payer: Medicare Other | Source: Ambulatory Visit | Attending: Radiation Oncology | Admitting: Radiation Oncology

## 2012-05-13 DIAGNOSIS — Z510 Encounter for antineoplastic radiation therapy: Secondary | ICD-10-CM | POA: Diagnosis not present

## 2012-05-13 DIAGNOSIS — C50919 Malignant neoplasm of unspecified site of unspecified female breast: Secondary | ICD-10-CM | POA: Diagnosis not present

## 2012-05-14 ENCOUNTER — Ambulatory Visit
Admission: RE | Admit: 2012-05-14 | Discharge: 2012-05-14 | Disposition: A | Discharge status: Home / Self Care | Payer: Medicare Other | Source: Ambulatory Visit | Attending: Radiation Oncology | Admitting: Radiation Oncology

## 2012-05-14 DIAGNOSIS — Z510 Encounter for antineoplastic radiation therapy: Secondary | ICD-10-CM | POA: Diagnosis not present

## 2012-05-14 DIAGNOSIS — C50919 Malignant neoplasm of unspecified site of unspecified female breast: Secondary | ICD-10-CM | POA: Diagnosis not present

## 2012-05-14 DIAGNOSIS — C50319 Malignant neoplasm of lower-inner quadrant of unspecified female breast: Secondary | ICD-10-CM | POA: Diagnosis not present

## 2012-05-15 ENCOUNTER — Encounter: Payer: Self-pay | Admitting: Radiation Oncology

## 2012-05-15 ENCOUNTER — Ambulatory Visit
Admission: RE | Admit: 2012-05-15 | Discharge: 2012-05-15 | Disposition: A | Discharge status: Home / Self Care | Payer: Medicare Other | Source: Ambulatory Visit | Attending: Radiation Oncology | Admitting: Radiation Oncology

## 2012-05-15 VITALS — BP 139/82 | HR 79 | Temp 97.8°F | Resp 20 | Wt 103.4 lb

## 2012-05-15 DIAGNOSIS — C50919 Malignant neoplasm of unspecified site of unspecified female breast: Secondary | ICD-10-CM | POA: Diagnosis not present

## 2012-05-15 DIAGNOSIS — C50319 Malignant neoplasm of lower-inner quadrant of unspecified female breast: Secondary | ICD-10-CM

## 2012-05-15 DIAGNOSIS — Z510 Encounter for antineoplastic radiation therapy: Secondary | ICD-10-CM | POA: Diagnosis not present

## 2012-05-15 NOTE — Progress Notes (Signed)
Patient arrived,alert oriented x3,  No c/o pain, slight erythema right breast, 17/36 completed , eating and drinking water, ,stinging momentarily right breast doesn';t last states patient, arm not aching as much, it is better "  9:48 AM

## 2012-05-15 NOTE — Progress Notes (Signed)
   Department of Radiation Oncology  Phone:  [phone] Fax:        [phone]  Weekly Treatment Note    Name: Crystal [name] Date: 05/15/2012 MRN: [id number] DOB: 01/22/1946   Current dose: 30.6 Gy  Current fraction: 17   MEDICATIONS: Current Outpatient Prescriptions  Medication Sig Dispense Refill  . amLODipine (NORVASC) 5 MG tablet Take 5 mg by mouth daily.      Marland Kitchen aspirin 325 MG EC tablet Take 325 mg by mouth daily.      . mesalamine (CANASA) 1000 MG suppository Place 1,000 mg rectally at bedtime as needed.      . polyethylene glycol (MIRALAX / GLYCOLAX) packet Take 17 g by mouth 3 (three) times a week.      . Senna-Psyllium (PERDIEM PO) Take 2 tablets by mouth at bedtime.         ALLERGIES: Review of patient's allergies indicates no known allergies.   LABORATORY DATA:  Lab Results  Component Value Date   WBC 5.5 03/03/2012   HGB 12.7 03/03/2012   HCT 37.2 03/03/2012   MCV 93.0 03/03/2012   PLT 300 03/03/2012   Lab Results  Component Value Date   NA 137 03/03/2012   K 4.3 03/03/2012   CL 99 03/03/2012   CO2 27 03/03/2012   Lab Results  Component Value Date   ALT 13 02/06/2012   AST 22 02/06/2012   ALKPHOS 93 02/06/2012   BILITOT 1.0 02/06/2012     NARRATIVE: [name] was seen today for weekly treatment management. The chart was checked and the patient's films were reviewed. Patient is doing very well. She states that she is sleeping better. Using skin cream in each day. Minimal irritation at this point but she is beginning to notice her skin a little bit more.  PHYSICAL EXAMINATION: weight is 103 lb 6.4 oz (46.902 kg). Her oral temperature is 97.8 F (36.6 C). Her blood pressure is 139/82 and her pulse is 79. Her respiration is 20.      moderate hyperpigmentation present. Her skin looks 3 good at this point with no significant desquamation.  ASSESSMENT: The patient is doing satisfactorily with treatment.  PLAN: We will continue with the patient's  radiation treatment as planned.

## 2012-05-16 ENCOUNTER — Ambulatory Visit
Admission: RE | Admit: 2012-05-16 | Discharge: 2012-05-16 | Disposition: A | Discharge status: Home / Self Care | Payer: Medicare Other | Source: Ambulatory Visit | Attending: Radiation Oncology | Admitting: Radiation Oncology

## 2012-05-16 DIAGNOSIS — Z510 Encounter for antineoplastic radiation therapy: Secondary | ICD-10-CM | POA: Diagnosis not present

## 2012-05-16 DIAGNOSIS — C50919 Malignant neoplasm of unspecified site of unspecified female breast: Secondary | ICD-10-CM | POA: Diagnosis not present

## 2012-05-19 ENCOUNTER — Ambulatory Visit
Admission: RE | Admit: 2012-05-19 | Discharge: 2012-05-19 | Disposition: A | Discharge status: Home / Self Care | Payer: Medicare Other | Source: Ambulatory Visit | Attending: Radiation Oncology | Admitting: Radiation Oncology

## 2012-05-19 DIAGNOSIS — Z510 Encounter for antineoplastic radiation therapy: Secondary | ICD-10-CM | POA: Diagnosis not present

## 2012-05-19 DIAGNOSIS — C50919 Malignant neoplasm of unspecified site of unspecified female breast: Secondary | ICD-10-CM | POA: Diagnosis not present

## 2012-05-20 ENCOUNTER — Ambulatory Visit
Admission: RE | Admit: 2012-05-20 | Discharge: 2012-05-20 | Disposition: A | Discharge status: Home / Self Care | Payer: Medicare Other | Source: Ambulatory Visit | Attending: Radiation Oncology | Admitting: Radiation Oncology

## 2012-05-20 DIAGNOSIS — C50919 Malignant neoplasm of unspecified site of unspecified female breast: Secondary | ICD-10-CM | POA: Diagnosis not present

## 2012-05-20 DIAGNOSIS — Z510 Encounter for antineoplastic radiation therapy: Secondary | ICD-10-CM | POA: Diagnosis not present

## 2012-05-21 ENCOUNTER — Ambulatory Visit
Admission: RE | Admit: 2012-05-21 | Discharge: 2012-05-21 | Disposition: A | Discharge status: Home / Self Care | Payer: Medicare Other | Source: Ambulatory Visit | Attending: Radiation Oncology | Admitting: Radiation Oncology

## 2012-05-21 DIAGNOSIS — C50919 Malignant neoplasm of unspecified site of unspecified female breast: Secondary | ICD-10-CM | POA: Diagnosis not present

## 2012-05-21 DIAGNOSIS — Z510 Encounter for antineoplastic radiation therapy: Secondary | ICD-10-CM | POA: Diagnosis not present

## 2012-05-21 DIAGNOSIS — C50319 Malignant neoplasm of lower-inner quadrant of unspecified female breast: Secondary | ICD-10-CM | POA: Diagnosis not present

## 2012-05-22 ENCOUNTER — Ambulatory Visit
Admission: RE | Admit: 2012-05-22 | Discharge: 2012-05-22 | Disposition: A | Discharge status: Home / Self Care | Payer: Medicare Other | Source: Ambulatory Visit | Attending: Radiation Oncology | Admitting: Radiation Oncology

## 2012-05-22 ENCOUNTER — Encounter: Payer: Self-pay | Admitting: Radiation Oncology

## 2012-05-22 VITALS — BP 152/81 | HR 76 | Temp 98.5°F | Resp 20 | Wt 104.7 lb

## 2012-05-22 DIAGNOSIS — C50919 Malignant neoplasm of unspecified site of unspecified female breast: Secondary | ICD-10-CM | POA: Diagnosis not present

## 2012-05-22 DIAGNOSIS — Z510 Encounter for antineoplastic radiation therapy: Secondary | ICD-10-CM | POA: Diagnosis not present

## 2012-05-22 DIAGNOSIS — C50319 Malignant neoplasm of lower-inner quadrant of unspecified female breast: Secondary | ICD-10-CM

## 2012-05-22 MED ORDER — BIAFINE EX EMUL
Freq: Two times a day (BID) | CUTANEOUS | Status: DC
  Administered 2012-05-22: 10:00:00 via TOPICAL

## 2012-05-22 NOTE — Progress Notes (Signed)
Pt states the pain/soreness in her right arm from elbow to shoulder has now moved into her shoulder itself, is more constant. Pt states her right breast near nipple is "hard/firm feeling", denies pain. Pt will discuss these two issues w/dr today.  She also states she has itchiness, irritation above her right breast; gave pt Biafine lotion to apply. She reports appetite improved but is fatigued.

## 2012-05-22 NOTE — Progress Notes (Signed)
   Department of Radiation Oncology  Phone:  [phone] Fax:        [phone]  Weekly Treatment Note    Name: Crystal [name] Date: 05/22/2012 MRN: [id number] DOB: May 30, 1946   Current dose: 39.6 Gy  Current fraction: 22   MEDICATIONS: Current Outpatient Prescriptions  Medication Sig Dispense Refill  . amLODipine (NORVASC) 5 MG tablet Take 5 mg by mouth daily.      Marland Kitchen aspirin 325 MG EC tablet Take 325 mg by mouth daily.      Marland Kitchen emollient (BIAFINE) cream Apply topically 2 (two) times daily.      . mesalamine (CANASA) 1000 MG suppository Place 1,000 mg rectally at bedtime as needed.      . polyethylene glycol (MIRALAX / GLYCOLAX) packet Take 17 g by mouth 3 (three) times a week.      . Senna-Psyllium (PERDIEM PO) Take 2 tablets by mouth at bedtime.       Current Facility-Administered Medications  Medication Dose Route Frequency Provider Last Rate Last Dose  . topical emolient (BIAFINE) emulsion   Topical BID Jonna Coup, MD         ALLERGIES: Review of patient's allergies indicates no known allergies.   LABORATORY DATA:  Lab Results  Component Value Date   WBC 5.5 03/03/2012   HGB 12.7 03/03/2012   HCT 37.2 03/03/2012   MCV 93.0 03/03/2012   PLT 300 03/03/2012   Lab Results  Component Value Date   NA 137 03/03/2012   K 4.3 03/03/2012   CL 99 03/03/2012   CO2 27 03/03/2012   Lab Results  Component Value Date   ALT 13 02/06/2012   AST 22 02/06/2012   ALKPHOS 93 02/06/2012   BILITOT 1.0 02/06/2012     NARRATIVE: [name] was seen today for weekly treatment management. The chart was checked and the patient's films were reviewed. The patient is doing well. Some increased skin irritation with some itching. This is diffuse in the treatment area. She does note some firmness around the nipple region and some soreness in the right shoulder. She has had some migratory pain to some degree in the right upper extremity which was more distally  previously.  PHYSICAL EXAMINATION: weight is 104 lb 11.2 oz (47.492 kg). Her oral temperature is 98.5 F (36.9 C). Her blood pressure is 152/81 and her pulse is 76. Her respiration is 20.      the patient's skin looks good overall with a radiation dermatitis appearance. I do believe Biafine cream will help with this. No desquamation. Some firmness in the postoperative area around the nipple but no suspicious findings on exam today  ASSESSMENT: The patient is doing satisfactorily with treatment.  PLAN: We will continue with the patient's radiation treatment as planned. We have given her Biafine for her to use and I do believe that this will help with her symptoms. We will follow her shoulder discomfort. She states that she is not having any pain on the treatment table and therefore am not sure if we are exacerbating this issue.

## 2012-05-23 ENCOUNTER — Ambulatory Visit
Admission: RE | Admit: 2012-05-23 | Discharge: 2012-05-23 | Disposition: A | Discharge status: Home / Self Care | Payer: Medicare Other | Source: Ambulatory Visit | Attending: Radiation Oncology | Admitting: Radiation Oncology

## 2012-05-23 DIAGNOSIS — Z510 Encounter for antineoplastic radiation therapy: Secondary | ICD-10-CM | POA: Diagnosis not present

## 2012-05-23 DIAGNOSIS — C50919 Malignant neoplasm of unspecified site of unspecified female breast: Secondary | ICD-10-CM | POA: Diagnosis not present

## 2012-05-26 ENCOUNTER — Ambulatory Visit
Admission: RE | Admit: 2012-05-26 | Discharge: 2012-05-26 | Disposition: A | Discharge status: Home / Self Care | Payer: Medicare Other | Source: Ambulatory Visit | Attending: Radiation Oncology | Admitting: Radiation Oncology

## 2012-05-26 DIAGNOSIS — Z510 Encounter for antineoplastic radiation therapy: Secondary | ICD-10-CM | POA: Diagnosis not present

## 2012-05-26 DIAGNOSIS — C50919 Malignant neoplasm of unspecified site of unspecified female breast: Secondary | ICD-10-CM | POA: Diagnosis not present

## 2012-05-27 ENCOUNTER — Ambulatory Visit
Admission: RE | Admit: 2012-05-27 | Discharge: 2012-05-27 | Disposition: A | Discharge status: Home / Self Care | Payer: Medicare Other | Source: Ambulatory Visit | Attending: Radiation Oncology | Admitting: Radiation Oncology

## 2012-05-27 DIAGNOSIS — Z510 Encounter for antineoplastic radiation therapy: Secondary | ICD-10-CM | POA: Diagnosis not present

## 2012-05-27 DIAGNOSIS — C50919 Malignant neoplasm of unspecified site of unspecified female breast: Secondary | ICD-10-CM | POA: Diagnosis not present

## 2012-05-28 ENCOUNTER — Ambulatory Visit
Admission: RE | Admit: 2012-05-28 | Discharge: 2012-05-28 | Disposition: A | Discharge status: Home / Self Care | Payer: Medicare Other | Source: Ambulatory Visit | Attending: Radiation Oncology | Admitting: Radiation Oncology

## 2012-05-28 DIAGNOSIS — C50919 Malignant neoplasm of unspecified site of unspecified female breast: Secondary | ICD-10-CM | POA: Diagnosis not present

## 2012-05-28 DIAGNOSIS — C50319 Malignant neoplasm of lower-inner quadrant of unspecified female breast: Secondary | ICD-10-CM | POA: Diagnosis not present

## 2012-05-28 DIAGNOSIS — Z510 Encounter for antineoplastic radiation therapy: Secondary | ICD-10-CM | POA: Diagnosis not present

## 2012-05-29 ENCOUNTER — Other Ambulatory Visit: Payer: Medicare Other

## 2012-05-29 ENCOUNTER — Encounter: Payer: Self-pay | Admitting: Radiation Oncology

## 2012-05-29 ENCOUNTER — Ambulatory Visit
Admission: RE | Admit: 2012-05-29 | Discharge: 2012-05-29 | Disposition: A | Discharge status: Home / Self Care | Payer: Medicare Other | Source: Ambulatory Visit | Attending: Radiation Oncology | Admitting: Radiation Oncology

## 2012-05-29 DIAGNOSIS — Z510 Encounter for antineoplastic radiation therapy: Secondary | ICD-10-CM | POA: Diagnosis not present

## 2012-05-29 DIAGNOSIS — C50919 Malignant neoplasm of unspecified site of unspecified female breast: Secondary | ICD-10-CM | POA: Diagnosis not present

## 2012-05-29 DIAGNOSIS — C50319 Malignant neoplasm of lower-inner quadrant of unspecified female breast: Secondary | ICD-10-CM

## 2012-05-29 NOTE — Progress Notes (Signed)
Patient alert,oriented x3, steady gait, erythema on right chest wall, start of dermatitis, biafine crem hel[ping states patient, no c/o pain, 27/36 tx s completed 9:22 AM

## 2012-05-29 NOTE — Progress Notes (Signed)
  Radiation Oncology         [phone] ________________________________  Name: Crystal [name] MRN: [id number]  Date: 05/21/2012  DOB: 11-17-1945  Complex simulation note  The patient has undergone complex simulation for her upcoming boost treatment for her diagnosis of breast cancer. The patient has initially been planned to receive 50.4 gray. The patient will now receive a 15.6 gray boost to the seroma cavity which has been contoured. This will be accomplished using an en face electron field. Based on the depth of the target area, 12 MeV electrons will be used and this field has been normalized to the 90% isodose line. The patient's final total dose therefore will be 66 gray. A special port plan is requested for the boost treatment.   _______________________________  Radene Gunning, MD, PhD

## 2012-05-29 NOTE — Progress Notes (Signed)
   Department of Radiation Oncology  Phone:  [phone] Fax:        [phone]  Weekly Treatment Note    Name: Crystal [name] Date: 05/29/2012 MRN: [id number] DOB: Jun 24, 1946   Current dose: 48.6 Gy  Current fraction: 27   MEDICATIONS: Current Outpatient Prescriptions  Medication Sig Dispense Refill  . amLODipine (NORVASC) 5 MG tablet Take 5 mg by mouth daily.      Marland Kitchen aspirin 325 MG EC tablet Take 325 mg by mouth daily.      Marland Kitchen emollient (BIAFINE) cream Apply topically 2 (two) times daily.      . mesalamine (CANASA) 1000 MG suppository Place 1,000 mg rectally at bedtime as needed.      . polyethylene glycol (MIRALAX / GLYCOLAX) packet Take 17 g by mouth 3 (three) times a week.      . Senna-Psyllium (PERDIEM PO) Take 2 tablets by mouth at bedtime.         ALLERGIES: Review of patient's allergies indicates no known allergies.   LABORATORY DATA:  Lab Results  Component Value Date   WBC 5.5 03/03/2012   HGB 12.7 03/03/2012   HCT 37.2 03/03/2012   MCV 93.0 03/03/2012   PLT 300 03/03/2012   Lab Results  Component Value Date   NA 137 03/03/2012   K 4.3 03/03/2012   CL 99 03/03/2012   CO2 27 03/03/2012   Lab Results  Component Value Date   ALT 13 02/06/2012   AST 22 02/06/2012   ALKPHOS 93 02/06/2012   BILITOT 1.0 02/06/2012     NARRATIVE: Crystal [name] was seen today for weekly treatment management. The chart was checked and the patient's films were reviewed. The patient is doing well at this time. She was set up for her boost treatment. She is pleased with how her skin is doing, especially with the use of Biafine cream.  PHYSICAL EXAMINATION: vitals were not taken for this visit.     the patient's skin is doing quite well at this time. Diffuse erythema/hyperpigmentation. No significant desquamation currently.  ASSESSMENT: The patient is doing satisfactorily with treatment.  PLAN: We will continue with the patient's radiation treatment as planned.

## 2012-05-30 ENCOUNTER — Ambulatory Visit
Admission: RE | Admit: 2012-05-30 | Discharge: 2012-05-30 | Disposition: A | Discharge status: Home / Self Care | Payer: Medicare Other | Source: Ambulatory Visit | Attending: Radiation Oncology | Admitting: Radiation Oncology

## 2012-05-30 DIAGNOSIS — Z510 Encounter for antineoplastic radiation therapy: Secondary | ICD-10-CM | POA: Diagnosis not present

## 2012-05-30 DIAGNOSIS — C50919 Malignant neoplasm of unspecified site of unspecified female breast: Secondary | ICD-10-CM | POA: Diagnosis not present

## 2012-06-02 ENCOUNTER — Ambulatory Visit
Admission: RE | Admit: 2012-06-02 | Discharge: 2012-06-02 | Disposition: A | Discharge status: Home / Self Care | Payer: Medicare Other | Source: Ambulatory Visit | Attending: Radiation Oncology | Admitting: Radiation Oncology

## 2012-06-02 DIAGNOSIS — Z510 Encounter for antineoplastic radiation therapy: Secondary | ICD-10-CM | POA: Diagnosis not present

## 2012-06-02 DIAGNOSIS — C50919 Malignant neoplasm of unspecified site of unspecified female breast: Secondary | ICD-10-CM | POA: Diagnosis not present

## 2012-06-03 ENCOUNTER — Ambulatory Visit
Admission: RE | Admit: 2012-06-03 | Discharge: 2012-06-03 | Disposition: A | Discharge status: Home / Self Care | Payer: Medicare Other | Source: Ambulatory Visit | Attending: Radiation Oncology | Admitting: Radiation Oncology

## 2012-06-03 DIAGNOSIS — C50919 Malignant neoplasm of unspecified site of unspecified female breast: Secondary | ICD-10-CM | POA: Diagnosis not present

## 2012-06-03 DIAGNOSIS — Z510 Encounter for antineoplastic radiation therapy: Secondary | ICD-10-CM | POA: Diagnosis not present

## 2012-06-04 ENCOUNTER — Other Ambulatory Visit (HOSPITAL_BASED_OUTPATIENT_CLINIC_OR_DEPARTMENT_OTHER): Payer: Medicare Other | Admitting: Lab

## 2012-06-04 ENCOUNTER — Ambulatory Visit
Admission: RE | Admit: 2012-06-04 | Discharge: 2012-06-04 | Disposition: A | Discharge status: Home / Self Care | Payer: Medicare Other | Source: Ambulatory Visit | Attending: Radiation Oncology | Admitting: Radiation Oncology

## 2012-06-04 DIAGNOSIS — E559 Vitamin D deficiency, unspecified: Secondary | ICD-10-CM

## 2012-06-04 DIAGNOSIS — C50319 Malignant neoplasm of lower-inner quadrant of unspecified female breast: Secondary | ICD-10-CM | POA: Diagnosis not present

## 2012-06-04 DIAGNOSIS — C50919 Malignant neoplasm of unspecified site of unspecified female breast: Secondary | ICD-10-CM | POA: Diagnosis not present

## 2012-06-04 DIAGNOSIS — Z510 Encounter for antineoplastic radiation therapy: Secondary | ICD-10-CM | POA: Diagnosis not present

## 2012-06-04 LAB — CBC WITH DIFFERENTIAL/PLATELET
Basophils Absolute: 0 10*3/uL (ref 0.0–0.1)
Eosinophils Absolute: 0.1 10*3/uL (ref 0.0–0.5)
HGB: 12.7 g/dL (ref 11.6–15.9)
MONO#: 0.4 10*3/uL (ref 0.1–0.9)
NEUT#: 5.2 10*3/uL (ref 1.5–6.5)
Platelets: 236 10*3/uL (ref 145–400)
RBC: 3.9 10*6/uL (ref 3.70–5.45)
RDW: 12.7 % (ref 11.2–14.5)
WBC: 6.4 10*3/uL (ref 3.9–10.3)

## 2012-06-05 ENCOUNTER — Ambulatory Visit
Admission: RE | Admit: 2012-06-05 | Discharge: 2012-06-05 | Disposition: A | Discharge status: Home / Self Care | Payer: Medicare Other | Source: Ambulatory Visit | Attending: Radiation Oncology | Admitting: Radiation Oncology

## 2012-06-05 DIAGNOSIS — C50919 Malignant neoplasm of unspecified site of unspecified female breast: Secondary | ICD-10-CM | POA: Diagnosis not present

## 2012-06-05 DIAGNOSIS — Z510 Encounter for antineoplastic radiation therapy: Secondary | ICD-10-CM | POA: Diagnosis not present

## 2012-06-05 LAB — COMPREHENSIVE METABOLIC PANEL
AST: 21 U/L (ref 0–37)
Albumin: 4.4 g/dL (ref 3.5–5.2)
Alkaline Phosphatase: 86 U/L (ref 39–117)
BUN: 17 mg/dL (ref 6–23)
Calcium: 9.8 mg/dL (ref 8.4–10.5)
Creatinine, Ser: 0.75 mg/dL (ref 0.50–1.10)
Glucose, Bld: 91 mg/dL (ref 70–99)
Potassium: 4.7 mEq/L (ref 3.5–5.3)

## 2012-06-06 ENCOUNTER — Ambulatory Visit
Admission: RE | Admit: 2012-06-06 | Discharge: 2012-06-06 | Disposition: A | Discharge status: Home / Self Care | Payer: Medicare Other | Source: Ambulatory Visit | Attending: Radiation Oncology | Admitting: Radiation Oncology

## 2012-06-06 ENCOUNTER — Encounter: Payer: Self-pay | Admitting: Radiation Oncology

## 2012-06-06 VITALS — BP 124/81 | HR 90 | Temp 97.6°F | Resp 20 | Wt 103.5 lb

## 2012-06-06 DIAGNOSIS — Z510 Encounter for antineoplastic radiation therapy: Secondary | ICD-10-CM | POA: Diagnosis not present

## 2012-06-06 DIAGNOSIS — C50919 Malignant neoplasm of unspecified site of unspecified female breast: Secondary | ICD-10-CM | POA: Diagnosis not present

## 2012-06-06 DIAGNOSIS — C50319 Malignant neoplasm of lower-inner quadrant of unspecified female breast: Secondary | ICD-10-CM

## 2012-06-06 NOTE — Progress Notes (Signed)
  Radiation Oncology         [phone] ________________________________  Name: Crystal [name] MRN: [id number]  Date: 06/06/2012  DOB: 05-Mar-1946  Weekly Radiation Therapy Management  Current Dose: 60.15 Gy     Planned Dose:  66 Gy  Narrative . . . . . . . . The patient presents for routine under treatment assessment.                She has some skin irritation.                                Set-up films were reviewed.                                 The chart was checked. Physical Findings. . . NAD.  Filed Vitals:   06/06/12 0937  BP: 124/81  Pulse: 90  Temp: 97.6 F (36.4 C)  Resp: 20  breast erythema.  No desq. Weight essentially stable.  No significant changes. Impression . . . . . . . The patient is  tolerating radiation. Plan . . . . . . . . . . . . Continue treatment as planned.  ________________________________  Artist Pais. Kathrynn Running, M.D.

## 2012-06-06 NOTE — Progress Notes (Signed)
Patient alert,oriented x3, completed 5/8 boost rad tx to right breast, 28/28 breast rad txs allready completed, erythema, on skin, intact, does burn off and on and itches, uses biafine cream Says breast may burn 5 minute or 30 minutes hard to say how long states patient, gave [facility] card to patient discussed  With patient  9:42 AM

## 2012-06-09 ENCOUNTER — Ambulatory Visit
Admission: RE | Admit: 2012-06-09 | Discharge: 2012-06-09 | Disposition: A | Discharge status: Home / Self Care | Payer: Medicare Other | Source: Ambulatory Visit | Attending: Radiation Oncology | Admitting: Radiation Oncology

## 2012-06-09 ENCOUNTER — Ambulatory Visit
Admission: RE | Admit: 2012-06-09 | Discharge: 2012-06-09 | Disposition: A | Discharge status: Home / Self Care | Payer: Medicare Other | Source: Ambulatory Visit | Attending: Oncology | Admitting: Oncology

## 2012-06-09 DIAGNOSIS — C50919 Malignant neoplasm of unspecified site of unspecified female breast: Secondary | ICD-10-CM | POA: Diagnosis not present

## 2012-06-09 DIAGNOSIS — Z510 Encounter for antineoplastic radiation therapy: Secondary | ICD-10-CM | POA: Diagnosis not present

## 2012-06-09 DIAGNOSIS — Z780 Asymptomatic menopausal state: Secondary | ICD-10-CM | POA: Diagnosis not present

## 2012-06-09 DIAGNOSIS — C50319 Malignant neoplasm of lower-inner quadrant of unspecified female breast: Secondary | ICD-10-CM

## 2012-06-09 DIAGNOSIS — Z13820 Encounter for screening for osteoporosis: Secondary | ICD-10-CM | POA: Diagnosis not present

## 2012-06-10 ENCOUNTER — Ambulatory Visit
Admission: RE | Admit: 2012-06-10 | Discharge: 2012-06-10 | Disposition: A | Discharge status: Home / Self Care | Payer: Medicare Other | Source: Ambulatory Visit | Attending: Radiation Oncology | Admitting: Radiation Oncology

## 2012-06-10 DIAGNOSIS — Z510 Encounter for antineoplastic radiation therapy: Secondary | ICD-10-CM | POA: Diagnosis not present

## 2012-06-10 DIAGNOSIS — C50919 Malignant neoplasm of unspecified site of unspecified female breast: Secondary | ICD-10-CM | POA: Diagnosis not present

## 2012-06-11 ENCOUNTER — Telehealth: Payer: Self-pay | Admitting: Oncology

## 2012-06-11 ENCOUNTER — Ambulatory Visit
Admission: RE | Admit: 2012-06-11 | Discharge: 2012-06-11 | Disposition: A | Discharge status: Home / Self Care | Payer: Medicare Other | Source: Ambulatory Visit | Attending: Radiation Oncology | Admitting: Radiation Oncology

## 2012-06-11 ENCOUNTER — Ambulatory Visit (HOSPITAL_BASED_OUTPATIENT_CLINIC_OR_DEPARTMENT_OTHER): Payer: Medicare Other | Admitting: Oncology

## 2012-06-11 VITALS — BP 130/81 | HR 90 | Temp 98.1°F | Resp 20 | Ht 62.0 in | Wt 103.7 lb

## 2012-06-11 VITALS — BP 148/87 | HR 85 | Temp 97.7°F | Wt 104.1 lb

## 2012-06-11 DIAGNOSIS — Z170 Estrogen receptor positive status [ER+]: Secondary | ICD-10-CM | POA: Diagnosis not present

## 2012-06-11 DIAGNOSIS — C50319 Malignant neoplasm of lower-inner quadrant of unspecified female breast: Secondary | ICD-10-CM

## 2012-06-11 DIAGNOSIS — C50919 Malignant neoplasm of unspecified site of unspecified female breast: Secondary | ICD-10-CM | POA: Diagnosis not present

## 2012-06-11 DIAGNOSIS — M810 Age-related osteoporosis without current pathological fracture: Secondary | ICD-10-CM

## 2012-06-11 DIAGNOSIS — Z510 Encounter for antineoplastic radiation therapy: Secondary | ICD-10-CM | POA: Diagnosis not present

## 2012-06-11 MED ORDER — ANASTROZOLE 1 MG PO TABS: 1.0000 mg | ORAL_TABLET | Freq: Every day | ORAL | Status: DC

## 2012-06-11 MED ORDER — VENLAFAXINE HCL ER 37.5 MG PO CP24: 37.5000 mg | ORAL_CAPSULE | Freq: Every day | ORAL | Status: DC

## 2012-06-11 MED ORDER — BIAFINE EX EMUL
CUTANEOUS | Status: DC | PRN
Start: 2012-06-11 — End: 2012-06-12
  Administered 2012-06-11: 1 via TOPICAL

## 2012-06-11 NOTE — Addendum Note (Signed)
Encounter addended by: Tessa Lerner, RN on: 06/11/2012 10:14 AM<BR>     Documentation filed: Inpatient MAR, Orders

## 2012-06-11 NOTE — Telephone Encounter (Signed)
gve the pt her oct 2013 appt calendar °

## 2012-06-11 NOTE — Progress Notes (Signed)
ID: Crystal [name]   DOB: 1946-08-09  MR#: [id number]  CSN#:[id number]  HISTORY OF PRESENT ILLNESS: The patient saw Crystal. Huel [name] for routine gynecologic followup and she noted a palpable mass in the right breast she said Crystal [name] up for right mammography and ultrasonography, performed at the breast center 01/29/2012. The breasts were heterogeneously dense. It mass with dense calcification was noted centrally in the right breast consistent with a benign fibroadenoma. The new palpable mass was not well demonstrated by mammography. It was firm, mobile, and measured approximately 1 cm by palpation. Ultrasound showed an irregularly marginated hypoechoic mass in this area, measuring 1.1 cm.  Biopsy was performed the same day, and showed [phone]) an invasive lobular breast cancer, e-cadherin negative,grade 1, estrogen and progesterone receptor positive, both of 100%, with an MIB-1 of 14%, and no HER-2 amplification.  The patient underwent a left diagnostic mammography 01/30/2012 and bilateral breast MRI 0415/2013.there were no other areas of concern in either breast, and there was no adenopathy noted. By MRI of the right breast mass measured 1.4 cm. Her subsequent history is as detailed below.  INTERVAL HISTORY: Crystal [name] returns today for followup of her breast cancer. Today she completed her radiation treatments  REVIEW OF SYSTEMS: She had some skin burning and itching and some fatigue from the radiation but overall she tolerated it well. She remains very anxious. She is not eating well. She has lost weight. She is not sleeping well either. She doesn't really have a wide support group. Her only relative is her son, who is in his late 84s, and "into his own life". We discussed all this in detail today. Overall however a review of systems is entirely negative except as noted.  PAST MEDICAL HISTORY: Past Medical History  Diagnosis Date  . Abdominal aortic ectasia 01/31/2010    Qualifier:  Diagnosis of  By: Crystal Bud MD, Crystal [name] ABDOMINAL PAIN RIGHT UPPER QUADRANT 11/07/2010    Qualifier: Diagnosis of  By: Crystal Motto MD Crystal [name] CEREBROVASCULAR DISEASE 06/16/2010    Qualifier: Diagnosis of  By: Crystal Ruiz MD, Crystal [name]   . CONSTIPATION 01/12/2009    Qualifier: Diagnosis of  By: Crystal [name] CMA (AAMA), Crystal [name]    . Cough 08/15/2010    Qualifier: Diagnosis of  By: Crystal Motto MD Crystal [name] DETRUSOR, OVERACTIVE 03/02/2010    Qualifier: Diagnosis of  By: Crystal Bud MD, Crystal [name] ESOPHAGEAL STRICTURE 01/12/2009    Qualifier: Diagnosis of  By: Crystal [name] CMA (AAMA), Crystal [name]    . EXTERNAL HEMORRHOIDS 01/12/2009    Qualifier: Diagnosis of  By: Crystal [name] CMA (AAMA), Crystal [name]    . GERD 05/10/2007    Qualifier: Diagnosis of  By: Crystal [name] RMA, Crystal [name]    . HYPERLIPIDEMIA 05/10/2007    Qualifier: Diagnosis of  By: Crystal [name], Crystal [name]    . HYPERTENSION 05/10/2007    Qualifier: Diagnosis of  By: Crystal [name], Crystal [name]    . HYPOTENSION, ORTHOSTATIC 06/16/2010    Qualifier: Diagnosis of  By: Crystal Ruiz MD, Crystal [name]   . Irritable bowel syndrome 01/12/2009    Qualifier: Diagnosis of  By: Crystal [name] CMA (AAMA), Crystal [name]    . MELANOSIS COLI 02/16/2009    Qualifier: Diagnosis of  By: Crystal [name] CMA (AAMA), Crystal [name]    . PERIPHERAL VASCULAR DISEASE 06/17/2010    Qualifier: Diagnosis of  By: Crystal Ruiz MD, Crystal [name]   . RECTAL FISSURE 02/16/2009    Qualifier: Diagnosis of  By: Crystal [name] CMA (AAMA),  Crystal [name]    . STENOSIS, RECTAL 02/16/2009    Qualifier: Diagnosis of  By: Crystal [name] CMA (AAMA), Crystal [name]    . SYNCOPE 04/19/2008    Qualifier: Diagnosis of  By: Crystal Bud MD, Crystal [name]   . TACHYARRHYTHMIA 09/03/2009    Qualifier: Diagnosis of  By: Crystal Bud MD, Crystal [name] TRANSIENT ISCHEMIC ATTACK 03/02/2010    Qualifier: Diagnosis of  By: Crystal Bud MD, Crystal [name] VENOUS INSUFFICIENCY, LEGS 07/18/2010    Qualifier: Diagnosis of  By: Crystal Bud MD, Crystal [name]   . Eosinophilic esophagitis   . Dysphagia   . Orthostatic hypotension   . Overactive detrusor   . Dizziness and  giddiness   . Sinusitis acute   . Personal history of other diseases of digestive system   . Anal or rectal pain   . Other malaise and fatigue   . Adjustment disorder with depressed mood   . Routine general medical examination at a health care facility   . Breast cancer 03/11/12    right breast lumpectomy=invasive lobular ca,grade I/III,ER?PR=positive    PAST SURGICAL HISTORY: Past Surgical History  Procedure Date  . Appendectomy   . Abdominal hysterectomy   . Hemmorhoidectomy   . Cardiac catherization   . Esophageal tumor excised; posterior 1986   . Electrocardiogram 10/30/2006  . Edg 04/21/2007  . Breast surgery     left breast x 2  . Cardiac catheterization     FAMILY HISTORY Family History  Problem Relation Age of Onset  . Bone cancer Father   . Cancer Father     Bone  . Heart disease Brother   . Colon cancer Neg Hx   . Breast cancer Neg Hx   . Diabetes Neg Hx   . Anesthesia problems Neg Hx   The patient's father died from "bone cancer" at the age of 47. The patient's mother died in an automobile accident at the age of 57. The patient had one brother who died from a heart attack at the age of 12. The patient had no sisters.  GYNECOLOGIC HISTORY: Menarche age 78, she is GX P1 with first live birth at age 80. She underwent to the change of life approximately 1978. She did not take hormone replacement.  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 Her husband died in a plane crash approximately 5 years ago. Her son, Crystal [name], 37, works in [employer] as an Personnel officer. The patient has no grandchildren. She lives alone with her cat Crystal [name].  ADVANCED DIRECTIVES: in place per Crystal [name]' note  HEALTH MAINTENANCE: History  Substance Use Topics  . Smoking status: Former Smoker    Quit date: 10/22/1965  . Smokeless tobacco: Never Used  . Alcohol Use: 1.8 oz/week    3 Glasses of wine per week     occasional      Colonoscopy: 2010  PAP:  2011  Bone density: 2011/ osteopenia  Lipid panel: per Crystal [name]  No Known Allergies  Current Outpatient Prescriptions  Medication Sig Dispense Refill  . amLODipine (NORVASC) 5 MG tablet Take 5 mg by mouth daily.      Marland Kitchen aspirin 325 MG EC tablet Take 325 mg by mouth daily.      Marland Kitchen emollient (BIAFINE) cream Apply topically 2 (two) times daily.      . polyethylene glycol (MIRALAX / GLYCOLAX) packet Take 17 g by mouth 3 (three) times a week.      Orson Eva (PERDIEM  PO) Take 2 tablets by mouth at bedtime.      . mesalamine (CANASA) 1000 MG suppository Place 1,000 mg rectally at bedtime as needed.        OBJECTIVE: elderly white woman who appears anxious Filed Vitals:   06/11/12 0956  BP: 130/81  Pulse: 90  Temp: 98.1 F (36.7 C)  Resp: 20     Body mass index is 18.97 kg/(m^2).    ECOG FS:1  Sclerae unicteric Oropharynx clear No cervical or supraclavicular adenopathy Lungs no rales or rhonchi Heart irregularly irregular rate and rhythm. No murmur appreciated Abd soft, nontender, positive bowel sounds MSK no focal spinal tenderness, no peripheral edema Neuro: nonfocal Breasts: The right breast is moderately erythematous, but there is no desquamation. The right axilla is clear.. The left breast is unremarkable  LAB RESULTS: Lab Results  Component Value Date   WBC 6.4 06/04/2012   NEUTROABS 5.2 06/04/2012   HGB 12.7 06/04/2012   HCT 37.0 06/04/2012   MCV 94.7 06/04/2012   PLT 236 06/04/2012      Chemistry      Component Value Date/Time   NA 144 06/04/2012 0914   K 4.7 06/04/2012 0914   CL 104 06/04/2012 0914   CO2 32 06/04/2012 0914   BUN 17 06/04/2012 0914   CREATININE 0.75 06/04/2012 0914      Component Value Date/Time   CALCIUM 9.8 06/04/2012 0914   ALKPHOS 86 06/04/2012 0914   AST 21 06/04/2012 0914   ALT 14 06/04/2012 0914   BILITOT 0.9 06/04/2012 0914       Lab Results  Component Value Date   LABCA2 10 02/06/2012    No components found with this basename:  [id number]    No results found for this basename: INR:1;PROTIME:1 in the last 168 hours  Urinalysis    Component Value Date/Time   COLORURINE LT. YELLOW 01/27/2009 0833   APPEARANCEUR CLEAR 01/27/2009 0833   LABSPEC 1.025 01/27/2009 0833   PHURINE 5.5 01/27/2009 0833   BILIRUBINUR NEGATIVE 01/27/2009 0833   KETONESUR NEGATIVE 01/27/2009 0833   UROBILINOGEN 0.2 01/27/2009 0833   NITRITE NEGATIVE 01/27/2009 0833   LEUKOCYTESUR NEGATIVE 01/27/2009 0833    STUDIES:  Dg Bone Density  06/09/2012  *RADIOLOGY REPORT*  Clinical Data: 66 year old postmenopausal female with history of breast cancer.  The patient is to begin taking an aromatase inhibitor.  DUAL X-RAY ABSORPTIOMETRY (DXA) FOR BONE MINERAL DENSITY  AP LUMBAR SPINE (L1 - L3)  Bone Mineral Density (BMD):            0.723 g/cm2 Young Adult T Score:                          -2.7 Z Score:                                                -0.7  LEFT FEMUR (NECK)  Bone Mineral Density (BMD):             0.520 g/cm2 Young Adult T Score:                           -3.0 Z Score:                                                 -  1.3  ASSESSMENT:  Patient's diagnostic category is OSTEOPOROSIS by WHO Criteria.  FRACTURE RISK: INCREASED  FRAX: World Health Organization FRAX assessment of absolute fracture risk is not calculated for this patient because the patient has osteoporosis.  Comparison: None.  RECOMMENDATIONS:  Effective therapies are available in the form of bisphosphonates, selective estrogen receptor modulators, biologic agents, and hormone replacement therapy (for women).  All patients should ensure an adequate intake of dietary calcium (1200mg  daily) and vitamin D (800 IU daily) unless contraindicated.  All treatment decisions require clinical judgement and consideration of individual patient factors, including patient preferences, co-morbidities, previous drug use, risk factors not captured in the FRAX model (e.g., frailty, falls, vitamin D deficiency, increased  bone turnover, interval significant decline in bone density) and possible under-or over-estimation of fracture risk by FRAX.  The National Osteoporosis Foundation recommends that FDA-approved medical therapies be considered in postmenopausal women and mean age 76 or older with a:        1)     Hip or vertebral (clinical or morphometric) fracture.           2)    T-score of -2.5 or lower at the spine or hip. 3)    Ten-year fracture probability by FRAX of 3% or greater for hip fracture or 20% or greater for major osteoporotic fracture. FOLLOW-UP:  People with diagnosed cases of osteoporosis or at high risk for fracture should have regular bone mineral density tests.  For patients eligible for Medicare, routine testing is allowed once every 2 years.  The testing frequency can be increased to one year for patients who have rapidly progressing disease, those who are receiving or discontinuing medical therapy to restore bone mass, or have additional risk factors.  World Science writer [facility]) Criteria:  Normal: T scores from +1.0 to -1.0 Low Bone Mass (Osteopenia): T scores between -1.0 and -2.5 Osteoporosis: T scores -2.5 and below  Comparison to Reference Population:  T score is the key measure used in the diagnosis of osteoporosis and relative risk determination for fracture.  It provides a value for bone mass relative to the mean bone mass of a young adult reference population expressed in terms of standard deviation (SD).  Z score is the age-matched score showing the patient's values compared to a population matched for age, sex, and race.  This is also expressed in terms of standard deviation.  The patient may have values that compare favorably to the age-matched values and still be at increased risk for fracture.   Original Report Authenticated By: Daryl Eastern, M.D.     ASSESSMENT: 66 year old [place] woman status post right lumpectomy 03/11/2012 for a pT1c N0, stage IA, invasive lobular breast  cancer, grade 1, with a broadly positive margin which according to surgery could not be cleared short of mastectomy. The tumor is strongly estrogen and progesterone receptor positive, with a low MIB and no evidence of HER-2 amplification. The Oncotype recurrence score is 23, predicting a 15% chance of distant recurrence with adjuvant tamoxifen. The Adjuvant! Program predicts a risk of recurrence of 17%, dropping to 9% with aromatase inhibitors.  (1) completed adjuvant irradiation 06/11/2012  (2) to start anastrozole 06/11/2012  (3) osteoporosis  PLAN: we discussed anti-estrogens and even though she has osteoporosis my feeling is she will do better with anastrozole and we will start that medication today. She will see me in 2 months. If she is tolerating the anastrozole well we will likely start her on week last and that  will take care of the osteoporosis problem. As far as her anxiety is concerned I have started her on Effexor XR at 37.5 mg. I have asked her to call us in about 3 weeks if she does not find it helpful so we can up the dose. I have also suggested she participated our support groups. Finally I suggested she discuss her situation with her son: She just needs to take the initiative and make sure he is therefore her up when she needs him.  She knows to call for any problems that may develop before the next visit here. MAGRINAT,GUSTAV C    06/11/2012

## 2012-06-11 NOTE — Progress Notes (Signed)
   Department of Radiation Oncology  Phone:  [phone] Fax:        [phone]  Weekly Treatment Note    Name: Crystal [name] Date: 06/11/2012 MRN: [id number] DOB: 06/23/1946   Current dose: 66 Gy  Current fraction: 36   MEDICATIONS: Current Outpatient Prescriptions  Medication Sig Dispense Refill  . amLODipine (NORVASC) 5 MG tablet Take 5 mg by mouth daily.      Marland Kitchen aspirin 325 MG EC tablet Take 325 mg by mouth daily.      Marland Kitchen emollient (BIAFINE) cream Apply topically 2 (two) times daily.      . mesalamine (CANASA) 1000 MG suppository Place 1,000 mg rectally at bedtime as needed.      . polyethylene glycol (MIRALAX / GLYCOLAX) packet Take 17 g by mouth 3 (three) times a week.      . Senna-Psyllium (PERDIEM PO) Take 2 tablets by mouth at bedtime.         ALLERGIES: Review of patient's allergies indicates no known allergies.   LABORATORY DATA:  Lab Results  Component Value Date   WBC 6.4 06/04/2012   HGB 12.7 06/04/2012   HCT 37.0 06/04/2012   MCV 94.7 06/04/2012   PLT 236 06/04/2012   Lab Results  Component Value Date   NA 144 06/04/2012   K 4.7 06/04/2012   CL 104 06/04/2012   CO2 32 06/04/2012   Lab Results  Component Value Date   ALT 14 06/04/2012   AST 21 06/04/2012   ALKPHOS 86 06/04/2012   BILITOT 0.9 06/04/2012     NARRATIVE: Crystal [name] was seen today for weekly treatment management. The chart was checked and the patient's films were reviewed. The patient completed today. She has done well. Some fatigue continuing in the afternoons.  PHYSICAL EXAMINATION: weight is 104 lb 1.6 oz (47.219 kg). Her temperature is 97.7 F (36.5 C). Her blood pressure is 148/87 and her pulse is 85.      the patient's skin looks excellent for finishing. Some diffuse, moderate hyperpigmentation present with some greater erythema in the boost region. No significant desquamation.  ASSESSMENT: The patient is doing satisfactorily with treatment. She finished today.  PLAN:  Followup in one month. She will continue to use skin cream/Biafine cream for another 2 weeks.

## 2012-06-11 NOTE — Progress Notes (Signed)
Patient completes treatment today.Skin with moderate hyperpigmentation.Will give additional tube of biafine to apply.Return follow up in one month.

## 2012-06-19 ENCOUNTER — Emergency Department (HOSPITAL_COMMUNITY)
Admission: EM | Admit: 2012-06-19 | Discharge: 2012-06-19 | Disposition: A | Discharge status: Home / Self Care | Payer: Medicare Other | Attending: Emergency Medicine | Admitting: Emergency Medicine

## 2012-06-19 ENCOUNTER — Other Ambulatory Visit: Payer: Self-pay | Admitting: Oncology

## 2012-06-19 ENCOUNTER — Emergency Department (HOSPITAL_COMMUNITY): Payer: Medicare Other

## 2012-06-19 ENCOUNTER — Encounter (HOSPITAL_COMMUNITY): Payer: Self-pay | Admitting: Family Medicine

## 2012-06-19 DIAGNOSIS — K824 Cholesterolosis of gallbladder: Secondary | ICD-10-CM | POA: Diagnosis not present

## 2012-06-19 DIAGNOSIS — R42 Dizziness and giddiness: Secondary | ICD-10-CM | POA: Diagnosis not present

## 2012-06-19 DIAGNOSIS — I739 Peripheral vascular disease, unspecified: Secondary | ICD-10-CM | POA: Diagnosis not present

## 2012-06-19 DIAGNOSIS — Z79899 Other long term (current) drug therapy: Secondary | ICD-10-CM | POA: Diagnosis not present

## 2012-06-19 DIAGNOSIS — C50919 Malignant neoplasm of unspecified site of unspecified female breast: Secondary | ICD-10-CM | POA: Insufficient documentation

## 2012-06-19 DIAGNOSIS — Z8673 Personal history of transient ischemic attack (TIA), and cerebral infarction without residual deficits: Secondary | ICD-10-CM | POA: Insufficient documentation

## 2012-06-19 DIAGNOSIS — R399 Unspecified symptoms and signs involving the genitourinary system: Secondary | ICD-10-CM

## 2012-06-19 DIAGNOSIS — I10 Essential (primary) hypertension: Secondary | ICD-10-CM | POA: Diagnosis not present

## 2012-06-19 DIAGNOSIS — R911 Solitary pulmonary nodule: Secondary | ICD-10-CM | POA: Insufficient documentation

## 2012-06-19 DIAGNOSIS — Z923 Personal history of irradiation: Secondary | ICD-10-CM | POA: Diagnosis not present

## 2012-06-19 DIAGNOSIS — R55 Syncope and collapse: Secondary | ICD-10-CM | POA: Insufficient documentation

## 2012-06-19 DIAGNOSIS — K219 Gastro-esophageal reflux disease without esophagitis: Secondary | ICD-10-CM | POA: Diagnosis not present

## 2012-06-19 DIAGNOSIS — R7401 Elevation of levels of liver transaminase levels: Secondary | ICD-10-CM | POA: Insufficient documentation

## 2012-06-19 DIAGNOSIS — E785 Hyperlipidemia, unspecified: Secondary | ICD-10-CM | POA: Diagnosis not present

## 2012-06-19 DIAGNOSIS — R944 Abnormal results of kidney function studies: Secondary | ICD-10-CM | POA: Diagnosis not present

## 2012-06-19 DIAGNOSIS — R9389 Abnormal findings on diagnostic imaging of other specified body structures: Secondary | ICD-10-CM | POA: Diagnosis not present

## 2012-06-19 DIAGNOSIS — R1031 Right lower quadrant pain: Secondary | ICD-10-CM | POA: Diagnosis not present

## 2012-06-19 DIAGNOSIS — M549 Dorsalgia, unspecified: Secondary | ICD-10-CM | POA: Diagnosis not present

## 2012-06-19 DIAGNOSIS — R7402 Elevation of levels of lactic acid dehydrogenase (LDH): Secondary | ICD-10-CM | POA: Insufficient documentation

## 2012-06-19 DIAGNOSIS — N289 Disorder of kidney and ureter, unspecified: Secondary | ICD-10-CM | POA: Diagnosis not present

## 2012-06-19 DIAGNOSIS — J984 Other disorders of lung: Secondary | ICD-10-CM | POA: Diagnosis not present

## 2012-06-19 LAB — COMPREHENSIVE METABOLIC PANEL
AST: 834 U/L — ABNORMAL HIGH (ref 0–37)
Albumin: 4.4 g/dL (ref 3.5–5.2)
BUN: 9 mg/dL (ref 6–23)
Calcium: 10.7 mg/dL — ABNORMAL HIGH (ref 8.4–10.5)
Chloride: 100 mEq/L (ref 96–112)
Creatinine, Ser: 0.57 mg/dL (ref 0.50–1.10)
Total Protein: 7.5 g/dL (ref 6.0–8.3)

## 2012-06-19 LAB — URINALYSIS, ROUTINE W REFLEX MICROSCOPIC
Glucose, UA: NEGATIVE mg/dL
Hgb urine dipstick: NEGATIVE
Leukocytes, UA: NEGATIVE
Specific Gravity, Urine: 1.016 (ref 1.005–1.030)
pH: 7 (ref 5.0–8.0)

## 2012-06-19 LAB — CBC WITH DIFFERENTIAL/PLATELET
Basophils Absolute: 0 10*3/uL (ref 0.0–0.1)
Basophils Relative: 0 % (ref 0–1)
Eosinophils Absolute: 0 10*3/uL (ref 0.0–0.7)
HCT: 37.9 % (ref 36.0–46.0)
MCH: 31.6 pg (ref 26.0–34.0)
MCHC: 34.3 g/dL (ref 30.0–36.0)
Monocytes Absolute: 0.4 10*3/uL (ref 0.1–1.0)
Neutro Abs: 7.9 10*3/uL — ABNORMAL HIGH (ref 1.7–7.7)
RDW: 12.1 % (ref 11.5–15.5)

## 2012-06-19 MED ORDER — PANTOPRAZOLE SODIUM 20 MG PO TBEC: 20.0000 mg | DELAYED_RELEASE_TABLET | Freq: Every day | ORAL | Status: DC

## 2012-06-19 MED ORDER — IOHEXOL 300 MG/ML  SOLN
100.0000 mL | Freq: Once | INTRAMUSCULAR | Status: AC | PRN
  Administered 2012-06-19: 100 mL via INTRAVENOUS

## 2012-06-19 MED ORDER — HYDROMORPHONE HCL PF 1 MG/ML IJ SOLN: 0.5000 mg | INTRAMUSCULAR | Status: DC | PRN

## 2012-06-19 MED ORDER — ONDANSETRON HCL 4 MG/2ML IJ SOLN
4.0000 mg | Freq: Four times a day (QID) | INTRAMUSCULAR | Status: DC | PRN
  Administered 2012-06-19: 4 mg via INTRAVENOUS
  Filled 2012-06-19: qty 2

## 2012-06-19 MED ORDER — SODIUM CHLORIDE 0.9 % IV SOLN
Freq: Once | INTRAVENOUS | Status: AC
  Administered 2012-06-19: 1000 mL via INTRAVENOUS

## 2012-06-19 NOTE — Progress Notes (Signed)
I was called by Dr. Effie Shy from the ER where the patient presented with abdominal discomfort. Workup so far has been nonspecific. Dr. Effie Shy wondered if anastrozole could be the cause. I suggested this was unlikely, but in any case is quite a right to stop the drug for a couple of months to see if that was contributing. This is accordingly being done.

## 2012-06-19 NOTE — ED Provider Notes (Signed)
She is being evaluated for short term abdominal pain started after radiation treatment. She has been recently started on Anastrozole (06/11/12); which has been reported to cause both abdominal pain, and hepatitis. 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 CT scan was not diagnostic for acute abnormalities. Incidental note is made of renal hypodensities. She was also started on Effexor on 06/11/12.   Case was discussed with GI, they agree with starting PPI and antacid. They will see, when necessary. I discussed the case with Dr. Darnelle Catalan, Oncologist, and he agrees with a drug holiday from anastrozole, as a trial, since she has transaminitis, and abdominal discomfort. He will see her as scheduled in October, and when necessary.   Findings of CT scan abdomen and pelvis, and ultrasound, abdomen are reviewed. Of significance: There is unchanged; gallbladder polyp; there are chronic appearing renal cysts; there is an incompletely imaged 5 mm nodule right lower lobe, without available comparison studies.  Medical decision making- nonspecific abdominal pain, with differential diagnosis including gastritis/enteritis, and anastrozole intolerance. Transaminitis is nonspecific, and can be treated symptomatically and expectantly. Lung nodule will need nonurgent follow up.  Discharge Plan: See PCP in one week for repeat transaminases. See radiation oncology, as scheduled in 3 weeks, and to discuss the lung nodule. See oncology, as scheduled. Start Protonix, and when necessary antacid.  Flint Melter, MD 06/19/12 [phone]

## 2012-06-19 NOTE — ED Provider Notes (Addendum)
History     CSN: [id number]  Arrival date & time 06/19/12  0141   First MD Initiated Contact with Patient 06/19/12 [phone]      Chief Complaint  Patient presents with  . Abdominal Pain  . Back Pain    (Consider location/radiation/quality/duration/timing/severity/associated sxs/prior treatment) HPI Comments: 66 year old female who was recently diagnosed with breast cancer status post lumpectomy and recently finished radiation therapy who presents with approximately 7 hours of abdominal pain. She states that approximately 2 and half days ago she had 2 syncopal events where she felt lightheaded and had to lay down, had a very short episode of loss of consciousness. The next day the symptoms have totally gone away and she was feeling great without any complaints. Yesterday evening she developed nausea, chills and abdominal pain. She points to her epigastrium, states that it is worse when she lays down, has not had anything to eat since onset of symptoms but denies any diarrhea, dysuria, rashes, swelling, sore throat, shortness of breath, headache, numbness, weakness. She does admit to the occasional cough. The pain is persistent, waxes and wanes in intensity and is unlike any pain she has had in the past.  The history is provided by the patient and the spouse.    Past Medical History  Diagnosis Date  . Abdominal aortic ectasia 01/31/2010    Qualifier: Diagnosis of  By: Debby Bud MD, Rosalyn Gess ABDOMINAL PAIN RIGHT UPPER QUADRANT 11/07/2010    Qualifier: Diagnosis of  By: Jarold Motto MD Lang Snow CEREBROVASCULAR DISEASE 06/16/2010    Qualifier: Diagnosis of  By: Jonny Ruiz MD, Len Blalock   . CONSTIPATION 01/12/2009    Qualifier: Diagnosis of  By: Koleen Distance CMA (AAMA), Hulan Saas    . Cough 08/15/2010    Qualifier: Diagnosis of  By: Jarold Motto MD Lang Snow DETRUSOR, OVERACTIVE 03/02/2010    Qualifier: Diagnosis of  By: Debby Bud MD, Rosalyn Gess ESOPHAGEAL STRICTURE 01/12/2009    Qualifier: Diagnosis  of  By: Koleen Distance CMA (AAMA), Hulan Saas    . EXTERNAL HEMORRHOIDS 01/12/2009    Qualifier: Diagnosis of  By: Koleen Distance CMA (AAMA), Hulan Saas    . GERD 05/10/2007    Qualifier: Diagnosis of  By: Charlsie Quest RMA, Lucy    . HYPERLIPIDEMIA 05/10/2007    Qualifier: Diagnosis of  By: Tyrone Apple, Lucy    . HYPERTENSION 05/10/2007    Qualifier: Diagnosis of  By: Tyrone Apple, Lucy    . HYPOTENSION, ORTHOSTATIC 06/16/2010    Qualifier: Diagnosis of  By: Jonny Ruiz MD, Len Blalock   . Irritable bowel syndrome 01/12/2009    Qualifier: Diagnosis of  By: Koleen Distance CMA (AAMA), Hulan Saas    . MELANOSIS COLI 02/16/2009    Qualifier: Diagnosis of  By: Koleen Distance CMA (AAMA), Hulan Saas    . PERIPHERAL VASCULAR DISEASE 06/17/2010    Qualifier: Diagnosis of  By: Jonny Ruiz MD, Len Blalock   . RECTAL FISSURE 02/16/2009    Qualifier: Diagnosis of  By: Koleen Distance CMA (AAMA), Hulan Saas    . STENOSIS, RECTAL 02/16/2009    Qualifier: Diagnosis of  By: Koleen Distance CMA (AAMA), Hulan Saas    . SYNCOPE 04/19/2008    Qualifier: Diagnosis of  By: Debby Bud MD, Rosalyn Gess   . TACHYARRHYTHMIA 09/03/2009    Qualifier: Diagnosis of  By: Debby Bud MD, Rosalyn Gess TRANSIENT ISCHEMIC ATTACK 03/02/2010    Qualifier: Diagnosis of  By: Debby Bud MD, Rosalyn Gess VENOUS INSUFFICIENCY, LEGS 07/18/2010  Qualifier: Diagnosis of  By: Debby Bud MD, Rosalyn Gess   . Eosinophilic esophagitis   . Dysphagia   . Orthostatic hypotension   . Overactive detrusor   . Dizziness and giddiness   . Sinusitis acute   . Personal history of other diseases of digestive system   . Anal or rectal pain   . Other malaise and fatigue   . Adjustment disorder with depressed mood   . Routine general medical examination at a health care facility   . Breast cancer 03/11/12    right breast lumpectomy=invasive lobular ca,grade I/III,ER?PR=positive    Past Surgical History  Procedure Date  . Appendectomy   . Abdominal hysterectomy   . Hemmorhoidectomy   . Cardiac catherization   . Esophageal tumor excised; posterior 1986   .  Electrocardiogram 10/30/2006  . Edg 04/21/2007  . Breast surgery     left breast x 2  . Cardiac catheterization     Family History  Problem Relation Age of Onset  . Bone cancer Father   . Cancer Father     Bone  . Heart disease Brother   . Colon cancer Neg Hx   . Breast cancer Neg Hx   . Diabetes Neg Hx   . Anesthesia problems Neg Hx     History  Substance Use Topics  . Smoking status: Former Smoker    Quit date: 10/22/1965  . Smokeless tobacco: Never Used  . Alcohol Use: 1.8 oz/week    3 Glasses of wine per week     occasional     OB History    Grav Para Term Preterm Abortions TAB SAB Ect Mult Living                  Review of Systems  All other systems reviewed and are negative.    Allergies  Review of patient's allergies indicates no known allergies.  Home Medications   Current Outpatient Rx  Name Route Sig Dispense Refill  . AMLODIPINE BESYLATE 5 MG PO TABS Oral Take 5 mg by mouth daily.    . ANASTROZOLE 1 MG PO TABS Oral Take 1 tablet (1 mg total) by mouth daily. 30 tablet 12  . ASPIRIN 325 MG PO TBEC Oral Take 325 mg by mouth daily.    Marland Kitchen EMOLLIENT BASE EX CREA Topical Apply topically 2 (two) times daily.    Marland Kitchen PERDIEM PO Oral Take 2 tablets by mouth at bedtime.    . VENLAFAXINE HCL ER 37.5 MG PO CP24 Oral Take 1 capsule (37.5 mg total) by mouth daily. 30 capsule 12  . POLYETHYLENE GLYCOL 3350 PO PACK Oral Take 17 g by mouth 3 (three) times a week.      BP 157/77  Pulse 103  Temp 98.6 F (37 C) (Oral)  Resp 16  Wt 104 lb 9 oz (47.429 kg)  SpO2 98%  Physical Exam  Nursing note and vitals reviewed. Constitutional: She appears well-developed and well-nourished. No distress.  HENT:  Head: Normocephalic and atraumatic.  Mouth/Throat: Oropharynx is clear and moist. No oropharyngeal exudate.  Eyes: Conjunctivae and EOM are normal. Pupils are equal, round, and reactive to light. Right eye exhibits no discharge. Left eye exhibits no discharge. No scleral  icterus.  Neck: Normal range of motion. Neck supple. No JVD present. No thyromegaly present.  Cardiovascular: Regular rhythm, normal heart sounds and intact distal pulses.  Exam reveals no gallop and no friction rub.   No murmur heard.      Pulse  of 105 on my exam  Pulmonary/Chest: Effort normal and breath sounds normal. No respiratory distress. She has no wheezes. She has no rales.  Abdominal: Soft. Bowel sounds are normal. She exhibits no distension and no mass. There is tenderness. There is no rebound and no guarding.       Tenderness located in the right lower quadrant at McBurney's point. Mild tenderness located in the epigastrium. No guarding, no peritoneal signs  Musculoskeletal: Normal range of motion. She exhibits no edema and no tenderness.  Lymphadenopathy:    She has no cervical adenopathy.  Neurological: She is alert. Coordination normal.  Skin: Skin is warm and dry. No rash noted. No erythema.  Psychiatric: She has a normal mood and affect. Her behavior is normal.    ED Course  Procedures (including critical care time)  Labs Reviewed  CBC WITH DIFFERENTIAL - Abnormal; Notable for the following:    Neutrophils Relative 89 (*)     Neutro Abs 7.9 (*)     Lymphocytes Relative 7 (*)     Lymphs Abs 0.6 (*)     All other components within normal limits  COMPREHENSIVE METABOLIC PANEL - Abnormal; Notable for the following:    Glucose, Bld 119 (*)     Calcium 10.7 (*)     AST 834 (*)     ALT 443 (*)     Alkaline Phosphatase 232 (*)     Total Bilirubin 1.4 (*)     All other components within normal limits  URINALYSIS, ROUTINE W REFLEX MICROSCOPIC   Ct Abdomen Pelvis W Contrast  06/19/2012  *RADIOLOGY REPORT*  Clinical Data: Right lower quadrant pain  CT ABDOMEN AND PELVIS WITH CONTRAST  Technique:  Multidetector CT imaging of the abdomen and pelvis was performed following the standard protocol during bolus administration of intravenous contrast.  Contrast: OMNIPAQUE  IOHEXOL 300 MG/ML  SOLN  Comparison: None  Findings: Partially imaged soft tissue and treatment changes of the right breast in this patient with known history of right breast cancer.  There is a partially imaged nodule on image 1 measuring about 5 mm.  Heterogeneous attenuation/reticular enhancement of the liver is nonspecific post contrast however can be seen with fatty infiltration or hepatitis. Unremarkable spleen, pancreas, biliary system, adrenal glands.  There are hypodensities within both kidneys, too small further characterize. No hydronephrosis or hydroureter.  No bowel obstruction.  Colonic diverticulosis.  No CT evidence for colitis or diverticulitis. The appendix appears short and decompressed.  No free intraperitoneal air or fluid.  No lymphadenopathy.  There is scattered atherosclerotic calcification of the aorta and its branches. No aneurysmal dilatation.  Thin-walled bladder.  Absent uterus.  No adnexal mass. Prominent left pelvic collaterals.  Multilevel degenerative changes of the imaged spine. No acute or aggressive appearing osseous lesion.  IMPRESSION:  No CT evidence for appendicitis.  Heterogeneous hepatic enhancement can be seen in the setting of hepatitis or fatty infiltration.  Bilateral renal hypodensities are incompletely characterized. Recommend comparison with prior outside studies for stability (no prior abdominal CT in our system). If unavailable, recommend renal MRI.  Partially imaged right breast mass/ post treatment change.  Incompletely imaged 5 mm nodule within the right lower lobe. Recommend comparison with prior imaging and dedicated chest CT follow-up if unavailable.   Original Report Authenticated By: Waneta Martins, M.D.      1. Transaminitis   2. Pulmonary nodule   3. Abnormal renal finding       MDM  Overall  the patient has abdominal tenderness. There is no pulmonary findings, no fever. She denies any dysuria or diarrhea or significant respiratory symptoms.  Given her location in the right lower quadrant with significant tenderness will require a CT scan to rule out appendicitis. Will check labs to rule out pancreatitis or cholecystitis as source of upper abdominal pain though this is less intense than the right lower quadrant pain. The patient declines pain or nausea medicines at this time.      ED ECG REPORT  I personally interpreted this EKG   Date: 06/19/2012   Rate: 86  Rhythm: normal sinus rhythm  QRS Axis: normal  Intervals: normal  ST/T Wave abnormalities: normal  Conduction Disutrbances:none  Narrative Interpretation:   Old EKG Reviewed: Compared with 03/03/2012, no significant changes   Pt has ongoing mild pain - I have discussed care with Dr. Russella Dar who requests US of the RUQ and then to be called with results.    Change of shift - care signed out to Dr. Arbie Cookey, MD 06/19/12 0725  Vida Roller, MD 06/19/12 [phone]

## 2012-06-19 NOTE — ED Notes (Signed)
Patient states that she has been having abdominal pain since 9pm. Patient states pain started as pressure. States pain comes in waves and radiates into back. States she finished radiation for breast cancer last Wed. Recently started on Anastrozole.

## 2012-06-20 ENCOUNTER — Telehealth: Payer: Self-pay | Admitting: *Deleted

## 2012-06-20 ENCOUNTER — Encounter: Payer: Self-pay | Admitting: *Deleted

## 2012-06-20 NOTE — Telephone Encounter (Signed)
per MD, add to AB sch 07/11/12 at 215//pt is aware

## 2012-06-20 NOTE — Progress Notes (Signed)
Pt reports that she visited the ED 06/19/12 with severe abdominal pain. Pt was given instructions to d/c anastrozole, start protonix  and f/u with MD in 2 weeks. Per Dr Darnelle Catalan, pt is to remain off anastroze for 3 weeks and f/u with AB on sept 20th. onc tx sent to sch. For pt to be seen by AB 07/11/12 at 215. Pt is aware

## 2012-06-20 NOTE — Telephone Encounter (Signed)
per desk nurse move patient's time to 9:15am patient aware

## 2012-06-30 ENCOUNTER — Ambulatory Visit (INDEPENDENT_AMBULATORY_CARE_PROVIDER_SITE_OTHER): Payer: Medicare Other | Admitting: Internal Medicine

## 2012-06-30 ENCOUNTER — Encounter: Payer: Self-pay | Admitting: Internal Medicine

## 2012-06-30 ENCOUNTER — Other Ambulatory Visit (INDEPENDENT_AMBULATORY_CARE_PROVIDER_SITE_OTHER): Payer: Medicare Other

## 2012-06-30 VITALS — BP 142/88 | HR 82 | Temp 98.6°F | Resp 16 | Wt 103.0 lb

## 2012-06-30 DIAGNOSIS — Z23 Encounter for immunization: Secondary | ICD-10-CM

## 2012-06-30 DIAGNOSIS — R7401 Elevation of levels of liver transaminase levels: Secondary | ICD-10-CM

## 2012-06-30 DIAGNOSIS — R911 Solitary pulmonary nodule: Secondary | ICD-10-CM | POA: Diagnosis not present

## 2012-06-30 LAB — LDL CHOLESTEROL, DIRECT: Direct LDL: 147.6 mg/dL

## 2012-06-30 LAB — LIPID PANEL
Cholesterol: 238 mg/dL — ABNORMAL HIGH (ref 0–200)
HDL: 74.6 mg/dL (ref >39.00)
Total CHOL/HDL Ratio: 3
Triglycerides: 87 mg/dL (ref 0.0–149.0)
VLDL: 17.4 mg/dL (ref 0.0–40.0)

## 2012-06-30 NOTE — Progress Notes (Signed)
Subjective:     Patient ID: Crystal [name], female   DOB: November 14, 1945, 66 y.o.   MRN: [id number]  HPI Comments: Crystal [name] is a 66 year old female with a history of breast cancer (T1cN0M0) being treated with radiation who has come into clinic today for follow-up after an ER visit for abdominal pain. She presented to the ER on 06/19/2012 after starting anastrozole on 06/11/2012 with abdominal pain. In the ER she had LFTs that were elevated and was told to follow-up with her PCP. The ER physician stopped her anastrozole and started her on protonix. She also received a CT of her abdomen and pelvis in the ED which showed a 5 cm mass in the RLL of her lung. She denies changes in bowel movements, urination, cough, hemoptysis, SOB, and fever. She states that she does experience chills. PT has requested influenza vaccine.  Past Medical History  Diagnosis Date  . Abdominal aortic ectasia 01/31/2010    Qualifier: Diagnosis of  By: Debby Bud MD, Rosalyn Gess ABDOMINAL PAIN RIGHT UPPER QUADRANT 11/07/2010    Qualifier: Diagnosis of  By: Jarold Motto MD Lang Snow CEREBROVASCULAR DISEASE 06/16/2010    Qualifier: Diagnosis of  By: Jonny Ruiz MD, Len Blalock   . CONSTIPATION 01/12/2009    Qualifier: Diagnosis of  By: Koleen Distance CMA (AAMA), Hulan Saas    . Cough 08/15/2010    Qualifier: Diagnosis of  By: Jarold Motto MD Lang Snow DETRUSOR, OVERACTIVE 03/02/2010    Qualifier: Diagnosis of  By: Debby Bud MD, Rosalyn Gess ESOPHAGEAL STRICTURE 01/12/2009    Qualifier: Diagnosis of  By: Koleen Distance CMA (AAMA), Hulan Saas    . EXTERNAL HEMORRHOIDS 01/12/2009    Qualifier: Diagnosis of  By: Koleen Distance CMA (AAMA), Hulan Saas    . GERD 05/10/2007    Qualifier: Diagnosis of  By: Charlsie Quest RMA, Lucy    . HYPERLIPIDEMIA 05/10/2007    Qualifier: Diagnosis of  By: Tyrone Apple, Lucy    . HYPERTENSION 05/10/2007    Qualifier: Diagnosis of  By: Tyrone Apple, Lucy    . HYPOTENSION, ORTHOSTATIC 06/16/2010    Qualifier: Diagnosis of  By: Jonny Ruiz MD, Len Blalock   . Irritable  bowel syndrome 01/12/2009    Qualifier: Diagnosis of  By: Koleen Distance CMA (AAMA), Hulan Saas    . MELANOSIS COLI 02/16/2009    Qualifier: Diagnosis of  By: Koleen Distance CMA (AAMA), Hulan Saas    . PERIPHERAL VASCULAR DISEASE 06/17/2010    Qualifier: Diagnosis of  By: Jonny Ruiz MD, Len Blalock   . RECTAL FISSURE 02/16/2009    Qualifier: Diagnosis of  By: Koleen Distance CMA (AAMA), Hulan Saas    . STENOSIS, RECTAL 02/16/2009    Qualifier: Diagnosis of  By: Koleen Distance CMA (AAMA), Hulan Saas    . SYNCOPE 04/19/2008    Qualifier: Diagnosis of  By: Debby Bud MD, Rosalyn Gess   . TACHYARRHYTHMIA 09/03/2009    Qualifier: Diagnosis of  By: Debby Bud MD, Rosalyn Gess TRANSIENT ISCHEMIC ATTACK 03/02/2010    Qualifier: Diagnosis of  By: Debby Bud MD, Rosalyn Gess VENOUS INSUFFICIENCY, LEGS 07/18/2010    Qualifier: Diagnosis of  By: Debby Bud MD, Rosalyn Gess   . Eosinophilic esophagitis   . Dysphagia   . Orthostatic hypotension   . Overactive detrusor   . Dizziness and giddiness   . Sinusitis acute   . Personal history of other diseases of digestive system   . Anal or rectal pain   . Other malaise and fatigue   .  Adjustment disorder with depressed mood   . Routine general medical examination at a health care facility   . Breast cancer 03/11/12    right breast lumpectomy=invasive lobular ca,grade I/III,ER?PR=positive   Past Surgical History  Procedure Date  . Appendectomy   . Abdominal hysterectomy   . Hemmorhoidectomy   . Cardiac catherization   . Esophageal tumor excised; posterior 1986   . Electrocardiogram 10/30/2006  . Edg 04/21/2007  . Breast surgery     left breast x 2  . Cardiac catheterization    Family History  Problem Relation Age of Onset  . Bone cancer Father   . Cancer Father     Bone  . Heart disease Brother   . Colon cancer Neg Hx   . Breast cancer Neg Hx   . Diabetes Neg Hx   . Anesthesia problems Neg Hx    History   Social History  . Marital Status: Widowed    Spouse Name: N/A    Number of Children: N/A  . Years of  Education: N/A   Occupational History  . Not on file.   Social History Main Topics  . Smoking status: Former Smoker    Quit date: 10/22/1965  . Smokeless tobacco: Never Used  . Alcohol Use: 1.8 oz/week    3 Glasses of wine per week     occasional   . Drug Use: No  . Sexually Active:    Other Topics Concern  . Not on file   Social History Narrative   HSGMarried '76-  Widowed '081 son - '76; no grandchildrenRetired, Lives alone. I-ADL'sEnd of Life: No resuscitation, no mechanical ventilation, no futile heroic measures.Patient is a former smoker, quit 40 years ago.Alcohol use-yes occ wineDaily caffeine use 2 cups coffee / dayIllicit drug use-noPatient does not get regular exercise.    Current Outpatient Prescriptions on File Prior to Visit  Medication Sig Dispense Refill  . amLODipine (NORVASC) 5 MG tablet Take 5 mg by mouth daily.      Marland Kitchen aspirin 325 MG EC tablet Take 325 mg by mouth daily.      Marland Kitchen emollient (BIAFINE) cream Apply topically 2 (two) times daily.      . pantoprazole (PROTONIX) 20 MG tablet Take 1 tablet (20 mg total) by mouth daily.  30 tablet  0  . polyethylene glycol (MIRALAX / GLYCOLAX) packet Take 17 g by mouth 3 (three) times a week.      . Senna-Psyllium (PERDIEM PO) Take 2 tablets by mouth at bedtime.      Marland Kitchen venlafaxine XR (EFFEXOR-XR) 37.5 MG 24 hr capsule Take 1 capsule (37.5 mg total) by mouth daily.  30 capsule  12    Review of Systems  All other systems reviewed and are negative.       Objective:   Physical Exam  Constitutional: She is oriented to person, place, and time. No distress.  Cardiovascular: Normal rate and regular rhythm.   Pulmonary/Chest: Effort normal and breath sounds normal. No respiratory distress. She has no wheezes. She has no rales. She exhibits no tenderness.       Clear to percussion in all lobes  Abdominal: Soft. Bowel sounds are normal. She exhibits no distension and no mass. There is tenderness (Mildly tender to deep  palpation in RUQ and RLQ.). There is no guarding.       Lower liver margin at right costal boarder.  Neurological: She is alert and oriented to person, place, and time.  Skin: She is not  diaphoretic.  Psychiatric: She has a normal mood and affect. Her behavior is normal. Judgment and thought content normal.       Assessment:     1. Elevated LFTs - Likely a side effect of the anastrozole. Her abdominal pain is mild and she does not have signs of hepatomegaly which indicates this was an acute process and consistent with a medication side effect.  PLAN - repeat LFTs. CS oncology to place her on a new medication for her breast cancer.  2. Mass in RLL - Found incidentally on an abdominal and pelvic CT. This needs more dedicated workup. Can possibly be a side effect of radiation, metastasis of cancer, or false positive.   PLAN - Chest CT with contrast.  3. Influenza Vaccine - PT is safe to receive the vaccine.  PLAN - administer influenza vaccine.     Attending note: patient interviewed and examined. She is doing well with her treatment. She has has lost some weight but is working to maintain her weight and is advised to make milk shake supplements. Agree with assessment and plans above per B. Kai Levins, MS III

## 2012-07-02 ENCOUNTER — Ambulatory Visit (INDEPENDENT_AMBULATORY_CARE_PROVIDER_SITE_OTHER)
Admission: RE | Admit: 2012-07-02 | Discharge: 2012-07-02 | Disposition: A | Discharge status: Home / Self Care | Payer: Medicare Other | Source: Ambulatory Visit | Attending: Internal Medicine | Admitting: Internal Medicine

## 2012-07-02 DIAGNOSIS — J479 Bronchiectasis, uncomplicated: Secondary | ICD-10-CM | POA: Diagnosis not present

## 2012-07-02 DIAGNOSIS — J984 Other disorders of lung: Secondary | ICD-10-CM | POA: Diagnosis not present

## 2012-07-02 DIAGNOSIS — R911 Solitary pulmonary nodule: Secondary | ICD-10-CM

## 2012-07-02 MED ORDER — IOHEXOL 300 MG/ML  SOLN
80.0000 mL | Freq: Once | INTRAMUSCULAR | Status: AC | PRN
  Administered 2012-07-02: 80 mL via INTRAVENOUS

## 2012-07-04 ENCOUNTER — Encounter: Payer: Self-pay | Admitting: Internal Medicine

## 2012-07-08 NOTE — Progress Notes (Signed)
  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 Medial and lateral tangent fields were used using 6 MV photons. Crystal [name] was then treated with a boost using an en face electron field. This treatment was a 15.6 gray boost to yield a final total dose of 66 gray to Crystal high dose region.  Narrative: Crystal [name] tolerated radiation treatment relatively well.   She did have moderate skin irritation at Crystal end of treatment but overall did quite well during her course.  Plan: Crystal [name] has completed radiation treatment. Crystal [name] will return to radiation oncology clinic for routine followup in one month. I advised Crystal [name] to call or return sooner if they have any questions or concerns related to their recovery or treatment. ________________________________  Radene Gunning, M.D., Ph.D.

## 2012-07-08 NOTE — Addendum Note (Signed)
Encounter addended by: Jonna Coup, MD on: 07/08/2012 11:47 AM<BR>     Documentation filed: Dictation, Notes Section

## 2012-07-09 ENCOUNTER — Encounter: Payer: Self-pay | Admitting: Radiation Oncology

## 2012-07-11 ENCOUNTER — Ambulatory Visit (HOSPITAL_BASED_OUTPATIENT_CLINIC_OR_DEPARTMENT_OTHER): Payer: Medicare Other | Admitting: Physician Assistant

## 2012-07-11 ENCOUNTER — Ambulatory Visit
Admission: RE | Admit: 2012-07-11 | Discharge: 2012-07-11 | Disposition: A | Discharge status: Home / Self Care | Payer: Medicare Other | Source: Ambulatory Visit | Attending: Radiation Oncology | Admitting: Radiation Oncology

## 2012-07-11 ENCOUNTER — Ambulatory Visit (HOSPITAL_BASED_OUTPATIENT_CLINIC_OR_DEPARTMENT_OTHER): Payer: Medicare Other | Admitting: Lab

## 2012-07-11 ENCOUNTER — Encounter: Payer: Self-pay | Admitting: Physician Assistant

## 2012-07-11 ENCOUNTER — Ambulatory Visit: Payer: Medicare Other | Admitting: Physician Assistant

## 2012-07-11 VITALS — BP 152/81 | HR 86 | Temp 98.9°F | Wt 103.1 lb

## 2012-07-11 VITALS — BP 144/84 | HR 87 | Temp 98.5°F | Resp 20 | Ht 62.0 in | Wt 103.7 lb

## 2012-07-11 DIAGNOSIS — C50319 Malignant neoplasm of lower-inner quadrant of unspecified female breast: Secondary | ICD-10-CM

## 2012-07-11 DIAGNOSIS — R109 Unspecified abdominal pain: Secondary | ICD-10-CM

## 2012-07-11 DIAGNOSIS — M810 Age-related osteoporosis without current pathological fracture: Secondary | ICD-10-CM | POA: Diagnosis not present

## 2012-07-11 DIAGNOSIS — R7989 Other specified abnormal findings of blood chemistry: Secondary | ICD-10-CM | POA: Diagnosis not present

## 2012-07-11 LAB — CBC WITH DIFFERENTIAL/PLATELET
BASO%: 0.5 % (ref 0.0–2.0)
Basophils Absolute: 0 10*3/uL (ref 0.0–0.1)
EOS%: 0.4 % (ref 0.0–7.0)
MCH: 32.3 pg (ref 25.1–34.0)
MCHC: 34.1 g/dL (ref 31.5–36.0)
MCV: 94.7 fL (ref 79.5–101.0)
MONO%: 5.8 % (ref 0.0–14.0)
RDW: 12.5 % (ref 11.2–14.5)
lymph#: 0.7 10*3/uL — ABNORMAL LOW (ref 0.9–3.3)

## 2012-07-11 LAB — COMPREHENSIVE METABOLIC PANEL (CC13)
ALT: 19 U/L (ref 0–55)
AST: 25 U/L (ref 5–34)
Albumin: 4.3 g/dL (ref 3.5–5.0)
Alkaline Phosphatase: 120 U/L (ref 40–150)
BUN: 12 mg/dL (ref 7.0–26.0)
Calcium: 10.2 mg/dL (ref 8.4–10.4)
Chloride: 105 mEq/L (ref 98–107)
Creatinine: 0.8 mg/dL (ref 0.6–1.1)
Potassium: 4.1 mEq/L (ref 3.5–5.1)

## 2012-07-11 NOTE — Progress Notes (Signed)
Here for routine follow of right breast cancer radiation completed on 06/11/12.Arimidex has been held as patient ended up in emergency department with abdominal pain.Doing well.Has occasional follicular rash itching.Redness has almost completely faded .

## 2012-07-11 NOTE — Progress Notes (Signed)
Radiation Oncology         [phone] ________________________________  Name: Crystal [name] MRN: [id number]  Date: 07/11/2012  DOB: 19-Oct-1946  Follow-Up Visit Note  CC: Illene Regulus, MD  Jacques Navy, MD  Diagnosis:   Invasive lobular carcinoma of the right breast  Interval Since Last Radiation:  One month   Narrative:  The patient returns today for routine follow-up.  The patient indicates that her skin has been healing. Some irritation remains with some pleuritis. This has been improving. She continues to use Biafine cream on a daily basis. The patient did begin taking hormonal medication. The patient however developed some abdominal pain and went to the emergency room. She was found to have some elevated liver enzymes and she was seen by medical oncology this morning who is going to continue to hold this medication for the time being. She is going to followup with them again next month.                              ALLERGIES:   has no known allergies.  Meds: Current Outpatient Prescriptions  Medication Sig Dispense Refill  . amLODipine (NORVASC) 5 MG tablet Take 5 mg by mouth daily.      Marland Kitchen aspirin 325 MG EC tablet Take 325 mg by mouth daily.      Marland Kitchen emollient (BIAFINE) cream Apply topically 2 (two) times daily.      . pantoprazole (PROTONIX) 20 MG tablet Take 1 tablet (20 mg total) by mouth daily.  30 tablet  0  . polyethylene glycol (MIRALAX / GLYCOLAX) packet Take 17 g by mouth 3 (three) times a week.      . Senna-Psyllium (PERDIEM PO) Take 2 tablets by mouth at bedtime.      Marland Kitchen venlafaxine XR (EFFEXOR-XR) 37.5 MG 24 hr capsule Take 1 capsule (37.5 mg total) by mouth daily.  30 capsule  12  . anastrozole (ARIMIDEX) 1 MG tablet         Physical Findings: The patient is in no acute distress. Patient is alert and oriented.  weight is 103 lb 1.6 oz (46.766 kg). Her temperature is 98.9 F (37.2 C). Her blood pressure is 152/81 and her pulse is 86. .   Dermatitis is  still present diffusely without any desquamation. The patient's skin is improving but still does have a little but more healing to return closer to baseline.  Lab Findings: Lab Results  Component Value Date   WBC 6.7 07/11/2012   HGB 12.9 07/11/2012   HCT 37.7 07/11/2012   MCV 94.7 07/11/2012   PLT 217 07/11/2012     Radiographic Findings: Ct Chest W Contrast  07/02/2012  *RADIOLOGY REPORT*  Clinical Data: Followup right lower lobe nodule.  Recent breast cancer diagnosis.  CT CHEST WITH CONTRAST  Technique:  Multidetector CT imaging of the chest was performed following the standard protocol during bolus administration of intravenous contrast.  Contrast: 80mL OMNIPAQUE IOHEXOL 300 MG/ML  SOLN  Comparison: CT abdomen pelvis 06/19/2012.  Findings: Sub-centimeter thyroid nodule(s) noted, too small to characterize, but most likely benign in the absence of known clinical risk factors for thyroid carcinoma.  No pathologically enlarged mediastinal, hilar or axillary lymph nodes.  Esophagus is dilated proximally.  Coronary artery calcification.  Heart size normal.  No pericardial effusion. Postoperative changes are seen in the right breast.  Biapical pleural parenchymal scarring.  There are areas of bronchiectasis and  mucoid impaction in the peripheral right lower lobe.  Scattered tiny nodular densities are seen in the lungs bilaterally, most of which are peripheral in location and all of which are 4 mm or less in size.  No pleural fluid.  Airway is unremarkable.  No pleural fluid.  Airway is unremarkable.  Incidental imaging of the upper abdomen shows low attenuation lesions in the left kidney, measuring up to 6 mm.  No worrisome lytic or sclerotic lesions.  IMPRESSION:  1.  Scattered tiny nodular densities in the lungs bilaterally, which are mostly peripheral in location. While a benign etiology is favored, in the setting of breast cancer, a short-term interval follow-up could be performed to ensure stability, as  clinically indicated. 2.  Focal bronchiectasis and mucus plugging in the right lower lobe.   Original Report Authenticated By: Reyes Ivan, M.D.    US Abdomen Complete  06/19/2012  *RADIOLOGY REPORT*  Clinical Data:  Abdominal pain.  Elevated liver function test.  COMPLETE ABDOMINAL ULTRASOUND  Comparison:  CT abdomen and pelvis earlier same date.  Abdominal ultrasound 11/14/2010, 02/03/2010.  Urinary tract ultrasound 01/23/2008 [facility].  Findings:  Gallbladder:  Approximate 5 mm non-shadowing, non-mobile echogenic focus in the body of the gallbladder, consistent with a polyp, unchanged.  No shadowing gallstones or echogenic sludge.  No gallbladder wall thickening or pericholecystic fluid.  Negative sonographic Murphy's sign according to the ultrasound technologist.  Common bile duct:  Normal in caliber with maximum diameter approximating 2 mm.  Liver:  Normal size and echotexture without focal parenchymal abnormality.  Patent portal vein with hepatopetal flow.  IVC:  Patent.  Pancreas:  Although the pancreas is difficult to visualize in its entirety, no focal pancreatic abnormality is identified.  The pancreas was normal on the earlier CT.  Spleen:  Normal size and echotexture without focal parenchymal abnormality.  Right Kidney:  No hydronephrosis.  Numerous hypoechoic lesions throughout the kidney as noted on CT, the largest arising from the mid pole measuring approximately 0.8 x 1.0 x 0.8 cm.  Well- preserved cortex with normal parenchymal echotexture. Approximately 9.8 cm in length.  Left Kidney:  No hydronephrosis.  Numerous hypoechoic lesions throughout the kidney as noted on CT, the largest arising from the mid kidney measuring 0.6 x 0.9 x 0.6 cm.  Well-preserved cortex with normal parenchymal echotexture.  Approximately 9.7 cm in length.  Abdominal aorta:  Normal in caliber throughout its visualized course in the abdomen with mild to moderate atherosclerosis.  IMPRESSION:  1.  5  mm gallbladder polyp.  No evidence of cholelithiasis or cholecystitis.  No biliary ductal dilation. 2.  Normal appearing liver by ultrasound. 3.  Numerous hypoechoic lesions throughout both kidneys consistent with cysts, as they have been present dating back to 2009 and are not significantly changed.   Original Report Authenticated By: Arnell Sieving, M.D.    Ct Abdomen Pelvis W Contrast  06/19/2012  *RADIOLOGY REPORT*  Clinical Data: Right lower quadrant pain  CT ABDOMEN AND PELVIS WITH CONTRAST  Technique:  Multidetector CT imaging of the abdomen and pelvis was performed following the standard protocol during bolus administration of intravenous contrast.  Contrast: OMNIPAQUE IOHEXOL 300 MG/ML  SOLN  Comparison: None  Findings: Partially imaged soft tissue and treatment changes of the right breast in this patient with known history of right breast cancer.  There is a partially imaged nodule on image 1 measuring about 5 mm.  Heterogeneous attenuation/reticular enhancement of the liver is nonspecific post  contrast however can be seen with fatty infiltration or hepatitis. Unremarkable spleen, pancreas, biliary system, adrenal glands.  There are hypodensities within both kidneys, too small further characterize. No hydronephrosis or hydroureter.  No bowel obstruction.  Colonic diverticulosis.  No CT evidence for colitis or diverticulitis. The appendix appears short and decompressed.  No free intraperitoneal air or fluid.  No lymphadenopathy.  There is scattered atherosclerotic calcification of the aorta and its branches. No aneurysmal dilatation.  Thin-walled bladder.  Absent uterus.  No adnexal mass. Prominent left pelvic collaterals.  Multilevel degenerative changes of the imaged spine. No acute or aggressive appearing osseous lesion.  IMPRESSION:  No CT evidence for appendicitis.  Heterogeneous hepatic enhancement can be seen in the setting of hepatitis or fatty infiltration.  Bilateral renal  hypodensities are incompletely characterized. Recommend comparison with prior outside studies for stability (no prior abdominal CT in our system). If unavailable, recommend renal MRI.  Partially imaged right breast mass/ post treatment change.  Incompletely imaged 5 mm nodule within the right lower lobe. Recommend comparison with prior imaging and dedicated chest CT follow-up if unavailable.   Original Report Authenticated By: Waneta Martins, M.D.     Impression:    66 year old female status post adjuvant radiotherapy for her right-sided breast cancer which was completed one month ago. Her skin has been improving although some dermatitis does remain. This is trending in the right direction however I do believe that this will continue to improve adequately over the next 2-3 weeks.  Plan:  The patient will followup with [place] on a when necessary basis. She knows to contact us if we can be of any further assistance.   Radene Gunning, M.D., Ph.D.

## 2012-07-11 NOTE — Progress Notes (Signed)
ID: Crystal [name]   DOB: 24-May-1946  MR#: [id number]  ONG#:[id number]  HISTORY OF PRESENT ILLNESS: The patient saw Dr. Huel Cote for routine gynecologic followup and she noted a palpable mass in the right breast she said Crystal [name] up for right mammography and ultrasonography, performed at the breast center 01/29/2012. The breasts were heterogeneously dense. It mass with dense calcification was noted centrally in the right breast consistent with a benign fibroadenoma. The new palpable mass was not well demonstrated by mammography. It was firm, mobile, and measured approximately 1 cm by palpation. Ultrasound showed an irregularly marginated hypoechoic mass in this area, measuring 1.1 cm.  Biopsy was performed the same day, and showed [id number]) an invasive lobular breast cancer, e-cadherin negative,grade 1, estrogen and progesterone receptor positive, both of 100%, with an MIB-1 of 14%, and no HER-2 amplification.  The patient underwent a left diagnostic mammography 01/30/2012 and bilateral breast MRI 0415/2013.there were no other areas of concern in either breast, and there was no adenopathy noted. By MRI of the right breast mass measured 1.4 cm. Her subsequent history is as detailed below.  INTERVAL HISTORY: Crystal [name] returns today for followup of her breast cancer, having recently made a trip to the emergency room for abdominal pain. Crystal [name] started on anastrozole on 06/11/2012. On 06/19/2012 she was seen in the emergency room for abdominal pain and was found to have acutely elevated LFTs. The anastrozole was discontinued. The abdominal pain has resolved, and we are awaiting repeat labs.  REVIEW OF SYSTEMS: Crystal [name] has felt tired and weak. She feels a little dizzy at times. She feels "cold all the time" but has had no known fevers. She notes no skin changes or itching. No peripheral swelling. She has occasional mild nausea but no emesis. She is trying to eat well, but admits that she  does not do a good job of keeping herself hydrated. She's having regular bowel movements and notes no change in bowel habits, no dark tarry stools, and no change in the stool color. She's had no change in urinary habits. She has some lower back pain, some joint pain, and tells me her knees feel "tight".   Crystal [name] denies any cough, shortness of breath, or chest pain. No abnormal headaches or change in vision.  A detailed review of systems is otherwise noncontributory.   PAST MEDICAL HISTORY: Past Medical History  Diagnosis Date  . Abdominal aortic ectasia 01/31/2010    Qualifier: Diagnosis of  By: Debby Bud MD, Rosalyn Gess ABDOMINAL PAIN RIGHT UPPER QUADRANT 11/07/2010    Qualifier: Diagnosis of  By: Jarold Motto MD Lang Snow CEREBROVASCULAR DISEASE 06/16/2010    Qualifier: Diagnosis of  By: Jonny Ruiz MD, Len Blalock   . CONSTIPATION 01/12/2009    Qualifier: Diagnosis of  By: Koleen Distance CMA (AAMA), Hulan Saas    . Cough 08/15/2010    Qualifier: Diagnosis of  By: Jarold Motto MD Lang Snow DETRUSOR, OVERACTIVE 03/02/2010    Qualifier: Diagnosis of  By: Debby Bud MD, Rosalyn Gess ESOPHAGEAL STRICTURE 01/12/2009    Qualifier: Diagnosis of  By: Koleen Distance CMA (AAMA), Hulan Saas    . EXTERNAL HEMORRHOIDS 01/12/2009    Qualifier: Diagnosis of  By: Koleen Distance CMA (AAMA), Hulan Saas    . GERD 05/10/2007    Qualifier: Diagnosis of  By: Charlsie Quest RMA, Lucy    . HYPERLIPIDEMIA 05/10/2007    Qualifier: Diagnosis of  By: Tyrone Apple, Lucy    . HYPERTENSION 05/10/2007  Qualifier: Diagnosis of  By: Tyrone Apple, Valentina Gu    . HYPOTENSION, ORTHOSTATIC 06/16/2010    Qualifier: Diagnosis of  By: Jonny Ruiz MD, Len Blalock   . Irritable bowel syndrome 01/12/2009    Qualifier: Diagnosis of  By: Koleen Distance CMA (AAMA), Hulan Saas    . MELANOSIS COLI 02/16/2009    Qualifier: Diagnosis of  By: Koleen Distance CMA (AAMA), Hulan Saas    . PERIPHERAL VASCULAR DISEASE 06/17/2010    Qualifier: Diagnosis of  By: Jonny Ruiz MD, Len Blalock   . RECTAL FISSURE 02/16/2009    Qualifier: Diagnosis of   By: Koleen Distance CMA (AAMA), Hulan Saas    . STENOSIS, RECTAL 02/16/2009    Qualifier: Diagnosis of  By: Koleen Distance CMA (AAMA), Hulan Saas    . SYNCOPE 04/19/2008    Qualifier: Diagnosis of  By: Debby Bud MD, Rosalyn Gess   . TACHYARRHYTHMIA 09/03/2009    Qualifier: Diagnosis of  By: Debby Bud MD, Rosalyn Gess TRANSIENT ISCHEMIC ATTACK 03/02/2010    Qualifier: Diagnosis of  By: Debby Bud MD, Rosalyn Gess VENOUS INSUFFICIENCY, LEGS 07/18/2010    Qualifier: Diagnosis of  By: Debby Bud MD, Rosalyn Gess   . Eosinophilic esophagitis   . Dysphagia   . Orthostatic hypotension   . Overactive detrusor   . Dizziness and giddiness   . Sinusitis acute   . Personal history of other diseases of digestive system   . Anal or rectal pain   . Other malaise and fatigue   . Adjustment disorder with depressed mood   . Routine general medical examination at a health care facility   . Breast cancer 03/11/12    right breast lumpectomy=invasive lobular ca,grade I/III,ER?PR=positive  . Radiation 04/21/2012-06/11/2012    64 gray right breast    PAST SURGICAL HISTORY: Past Surgical History  Procedure Date  . Appendectomy   . Abdominal hysterectomy   . Hemmorhoidectomy   . Cardiac catherization   . Esophageal tumor excised; posterior 1986   . Electrocardiogram 10/30/2006  . Edg 04/21/2007  . Breast surgery     left breast x 2  . Cardiac catheterization     FAMILY HISTORY Family History  Problem Relation Age of Onset  . Bone cancer Father   . Cancer Father     Bone  . Heart disease Brother   . Colon cancer Neg Hx   . Breast cancer Neg Hx   . Diabetes Neg Hx   . Anesthesia problems Neg Hx   The patient's father died from "bone cancer" at the age of 71. The patient's mother died in an automobile accident at the age of 44. The patient had one brother who died from a heart attack at the age of 53. The patient had no sisters.  GYNECOLOGIC HISTORY: Menarche age 73, she is GX P1 with first live birth at age 72. She underwent to the change  of life approximately 1978. She did not take hormone replacement.  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 Her husband died in a plane crash approximately 5 years ago. Her son, Crystal [name], 37, works in [employer] as an Personnel officer. The patient has no grandchildren. She lives alone with her cat Crystal [name].  ADVANCED DIRECTIVES: in place per Dr Debby Bud' note  HEALTH MAINTENANCE: History  Substance Use Topics  . Smoking status: Former Smoker    Quit date: 10/22/1965  . Smokeless tobacco: Never Used  . Alcohol Use: 1.8 oz/week    3 Glasses of wine per week  occasional      Colonoscopy: 2010  PAP: 2011  Bone density: 2011/ osteopenia  Lipid panel: UTD per Dr Debby Bud  No Known Allergies  Current Outpatient Prescriptions  Medication Sig Dispense Refill  . amLODipine (NORVASC) 5 MG tablet Take 5 mg by mouth daily.      Marland Kitchen aspirin 325 MG EC tablet Take 325 mg by mouth daily.      . pantoprazole (PROTONIX) 20 MG tablet Take 1 tablet (20 mg total) by mouth daily.  30 tablet  0  . polyethylene glycol (MIRALAX / GLYCOLAX) packet Take 17 g by mouth 3 (three) times a week.      . Senna-Psyllium (PERDIEM PO) Take 2 tablets by mouth at bedtime.      Marland Kitchen venlafaxine XR (EFFEXOR-XR) 37.5 MG 24 hr capsule Take 1 capsule (37.5 mg total) by mouth daily.  30 capsule  12  . anastrozole (ARIMIDEX) 1 MG tablet       . emollient (BIAFINE) cream Apply topically 2 (two) times daily.        OBJECTIVE: elderly white woman who appears frail and slightly anxious Filed Vitals:   07/11/12 0918  BP: 144/84  Pulse: 87  Temp: 98.5 F (36.9 C)  Resp: 20     Body mass index is 18.97 kg/(m^2).    ECOG FS:1 Filed Weights   07/11/12 0918  Weight: 103 lb 11.2 oz (47.038 kg)   Sclerae unicteric Oropharynx clear No cervical or supraclavicular adenopathy Lungs no rales or rhonchi Heart irregular rhythm, regular rate. No murmur appreciated Abd soft, nondistended, nontender, positive  bowel sounds MSK no focal spinal tenderness, no peripheral edema Neuro: nonfocal, alert and oriented x3 Breasts: Deferred  LAB RESULTS: Lab Results  Component Value Date   WBC 8.8 06/19/2012   NEUTROABS 7.9* 06/19/2012   HGB 13.0 06/19/2012   HCT 37.9 06/19/2012   MCV 92.0 06/19/2012   PLT 228 06/19/2012      Chemistry      Component Value Date/Time   NA 138 06/19/2012 0450   K 3.6 06/19/2012 0450   CL 100 06/19/2012 0450   CO2 28 06/19/2012 0450   BUN 9 06/19/2012 0450   CREATININE 0.57 06/19/2012 0450      Component Value Date/Time   CALCIUM 10.7* 06/19/2012 0450   ALKPHOS 232* 06/19/2012 0450   AST 834* 06/19/2012 0450   ALT 443* 06/19/2012 0450   BILITOT 1.4* 06/19/2012 0450     CBC, CMET and Hepatitis Panel were all drawn today, 07/11/2012, results pending.   Lab Results  Component Value Date   LABCA2 10 02/06/2012    STUDIES:  Ct Chest W Contrast  07/02/2012  *RADIOLOGY REPORT*  Clinical Data: Followup right lower lobe nodule.  Recent breast cancer diagnosis.  CT CHEST WITH CONTRAST  Technique:  Multidetector CT imaging of the chest was performed following the standard protocol during bolus administration of intravenous contrast.  Contrast: 80mL OMNIPAQUE IOHEXOL 300 MG/ML  SOLN  Comparison: CT abdomen pelvis 06/19/2012.  Findings: Sub-centimeter thyroid nodule(s) noted, too small to characterize, but most likely benign in the absence of known clinical risk factors for thyroid carcinoma.  No pathologically enlarged mediastinal, hilar or axillary lymph nodes.  Esophagus is dilated proximally.  Coronary artery calcification.  Heart size normal.  No pericardial effusion. Postoperative changes are seen in the right breast.  Biapical pleural parenchymal scarring.  There are areas of bronchiectasis and mucoid impaction in the peripheral right lower lobe.  Scattered tiny nodular densities  are seen in the lungs bilaterally, most of which are peripheral in location and all of which are 4 mm or  less in size.  No pleural fluid.  Airway is unremarkable.  No pleural fluid.  Airway is unremarkable.  Incidental imaging of the upper abdomen shows low attenuation lesions in the left kidney, measuring up to 6 mm.  No worrisome lytic or sclerotic lesions.  IMPRESSION:  1.  Scattered tiny nodular densities in the lungs bilaterally, which are mostly peripheral in location. While a benign etiology is favored, in the setting of breast cancer, a short-term interval follow-up could be performed to ensure stability, as clinically indicated. 2.  Focal bronchiectasis and mucus plugging in the right lower lobe.   Original Report Authenticated By: Reyes Ivan, M.D.    US Abdomen Complete  06/19/2012  *RADIOLOGY REPORT*  Clinical Data:  Abdominal pain.  Elevated liver function test.  COMPLETE ABDOMINAL ULTRASOUND  Comparison:  CT abdomen and pelvis earlier same date.  Abdominal ultrasound 11/14/2010, 02/03/2010.  Urinary tract ultrasound 01/23/2008 [facility].  Findings:  Gallbladder:  Approximate 5 mm non-shadowing, non-mobile echogenic focus in the body of the gallbladder, consistent with a polyp, unchanged.  No shadowing gallstones or echogenic sludge.  No gallbladder wall thickening or pericholecystic fluid.  Negative sonographic Murphy's sign according to the ultrasound technologist.  Common bile duct:  Normal in caliber with maximum diameter approximating 2 mm.  Liver:  Normal size and echotexture without focal parenchymal abnormality.  Patent portal vein with hepatopetal flow.  IVC:  Patent.  Pancreas:  Although the pancreas is difficult to visualize in its entirety, no focal pancreatic abnormality is identified.  The pancreas was normal on the earlier CT.  Spleen:  Normal size and echotexture without focal parenchymal abnormality.  Right Kidney:  No hydronephrosis.  Numerous hypoechoic lesions throughout the kidney as noted on CT, the largest arising from the mid pole measuring approximately  0.8 x 1.0 x 0.8 cm.  Well- preserved cortex with normal parenchymal echotexture. Approximately 9.8 cm in length.  Left Kidney:  No hydronephrosis.  Numerous hypoechoic lesions throughout the kidney as noted on CT, the largest arising from the mid kidney measuring 0.6 x 0.9 x 0.6 cm.  Well-preserved cortex with normal parenchymal echotexture.  Approximately 9.7 cm in length.  Abdominal aorta:  Normal in caliber throughout its visualized course in the abdomen with mild to moderate atherosclerosis.  IMPRESSION:  1.  5 mm gallbladder polyp.  No evidence of cholelithiasis or cholecystitis.  No biliary ductal dilation. 2.  Normal appearing liver by ultrasound. 3.  Numerous hypoechoic lesions throughout both kidneys consistent with cysts, as they have been present dating back to 2009 and are not significantly changed.   Original Report Authenticated By: Arnell Sieving, M.D.    Ct Abdomen Pelvis W Contrast  06/19/2012  *RADIOLOGY REPORT*  Clinical Data: Right lower quadrant pain  CT ABDOMEN AND PELVIS WITH CONTRAST  Technique:  Multidetector CT imaging of the abdomen and pelvis was performed following the standard protocol during bolus administration of intravenous contrast.  Contrast: OMNIPAQUE IOHEXOL 300 MG/ML  SOLN  Comparison: None  Findings: Partially imaged soft tissue and treatment changes of the right breast in this patient with known history of right breast cancer.  There is a partially imaged nodule on image 1 measuring about 5 mm.  Heterogeneous attenuation/reticular enhancement of the liver is nonspecific post contrast however can be seen with fatty infiltration or hepatitis. Unremarkable spleen, pancreas,  biliary system, adrenal glands.  There are hypodensities within both kidneys, too small further characterize. No hydronephrosis or hydroureter.  No bowel obstruction.  Colonic diverticulosis.  No CT evidence for colitis or diverticulitis. The appendix appears short and decompressed.  No free  intraperitoneal air or fluid.  No lymphadenopathy.  There is scattered atherosclerotic calcification of the aorta and its branches. No aneurysmal dilatation.  Thin-walled bladder.  Absent uterus.  No adnexal mass. Prominent left pelvic collaterals.  Multilevel degenerative changes of the imaged spine. No acute or aggressive appearing osseous lesion.  IMPRESSION:  No CT evidence for appendicitis.  Heterogeneous hepatic enhancement can be seen in the setting of hepatitis or fatty infiltration.  Bilateral renal hypodensities are incompletely characterized. Recommend comparison with prior outside studies for stability (no prior abdominal CT in our system). If unavailable, recommend renal MRI.  Partially imaged right breast mass/ post treatment change.  Incompletely imaged 5 mm nodule within the right lower lobe. Recommend comparison with prior imaging and dedicated chest CT follow-up if unavailable.   Original Report Authenticated By: Waneta Martins, M.D.     ASSESSMENT: 66 year old [place] woman status post right lumpectomy 03/11/2012 for a pT1c N0, stage IA, invasive lobular breast cancer, grade 1, with a broadly positive margin which according to surgery could not be cleared short of mastectomy. The tumor is strongly estrogen and progesterone receptor positive, with a low MIB and no evidence of HER-2 amplification. The Oncotype recurrence score is 23, predicting a 15% chance of distant recurrence with adjuvant tamoxifen. The Adjuvant! Program predicts a risk of recurrence of 17%, dropping to 9% with aromatase inhibitors.  (1) completed adjuvant irradiation 06/11/2012  (2) started anastrazole 06/11/2012 and held since 06/19/2012 due to acute abdominal pain and elevated LFTs  (3) osteoporosis  PLAN:  This case was reviewed with Dr. Darnelle Catalan. He has suggested that we would repeat [facility] labs, including not only her LFTs, but also a hepatitis panel. She will stay off the anastrozole until this issue  is resolved. She is scheduled to return in 4-6 weeks with Dr. Darnelle Catalan at which time we will repeat labs again. At that time, he is considering switching her from anastrozole to letrozole, and I did spend some time discussing this with the patient today.  She knows to call for any problems that may develop before the next visit here.  Jaziah Goeller    07/11/2012

## 2012-07-14 LAB — HEPATITIS PANEL, ACUTE
HCV Ab: NEGATIVE
Hep A IgM: NEGATIVE

## 2012-08-07 ENCOUNTER — Ambulatory Visit (INDEPENDENT_AMBULATORY_CARE_PROVIDER_SITE_OTHER): Payer: Medicare Other | Admitting: General Surgery

## 2012-08-07 ENCOUNTER — Encounter (INDEPENDENT_AMBULATORY_CARE_PROVIDER_SITE_OTHER): Payer: Self-pay | Admitting: General Surgery

## 2012-08-07 VITALS — BP 130/84 | HR 78 | Temp 97.5°F | Resp 16 | Ht 62.0 in | Wt 103.4 lb

## 2012-08-07 DIAGNOSIS — Z853 Personal history of malignant neoplasm of breast: Secondary | ICD-10-CM | POA: Diagnosis not present

## 2012-08-07 NOTE — Progress Notes (Signed)
Subjective:     Patient ID: Crystal [name], female   DOB: 08-18-1946, 66 y.o.   MRN: [id number]  HPI This patient follows up status post right breast lumpectomy with sentinel lymph node biopsy for T1 C. N0 right breast cancer. Her surgery was competent with a positive posterior margins of the muscle but no additional tissue was available for resection. She has completed her radiation therapy in August and seems to be doing well. She was started on Arimidex but this was stopped due to abdominal pain. She is scheduled to see her oncologist next week to discuss additional options for adjuvant treatment and alternatives for hormonal treatment. She says that she is doing her self breast exam and has not noticed any changes. She denies any abnormal weight loss headaches or bony pains or other systemic symptoms.  Review of Systems     Objective:   Physical Exam No distress nontoxic-appearing Her left breast is normal without any suspicious skin changes, suspicious masses, or lymphadenopathy Her right breast shows a well-healed surgical scar with some radiation skin changes but otherwise there is no suspicious masses or lymphadenopathy. She does have some hardness in the area of the retroareolar area of mainly at the 12:00 position at the border of the nipple areolar complex but there is no dominant mass.    Assessment:     History of right breast cancer She has a history of right breast cancer with positive margins and so have a low threshold investigate abnormalities in this patient. I do not see anything obvious hernia which is concerning. I think that everything appears normal for this stage postoperatively and after her radiation. I am pleased that she is going to see her oncologist for additional options for hormonal therapy. Have recommended repeat mammogram in January and I'll see her back after this.    Plan:     Repeat mammograms in January Followup with me after her mammograms in  January Continue monthly self breast exams and follow up sooner as needed

## 2012-08-13 ENCOUNTER — Other Ambulatory Visit (HOSPITAL_BASED_OUTPATIENT_CLINIC_OR_DEPARTMENT_OTHER): Payer: Medicare Other

## 2012-08-13 ENCOUNTER — Ambulatory Visit (HOSPITAL_BASED_OUTPATIENT_CLINIC_OR_DEPARTMENT_OTHER): Payer: Medicare Other | Admitting: Oncology

## 2012-08-13 ENCOUNTER — Telehealth: Payer: Self-pay | Admitting: *Deleted

## 2012-08-13 VITALS — BP 136/81 | HR 83 | Temp 98.3°F | Resp 20 | Ht 62.0 in | Wt 104.2 lb

## 2012-08-13 DIAGNOSIS — Z170 Estrogen receptor positive status [ER+]: Secondary | ICD-10-CM | POA: Diagnosis not present

## 2012-08-13 DIAGNOSIS — C50319 Malignant neoplasm of lower-inner quadrant of unspecified female breast: Secondary | ICD-10-CM | POA: Diagnosis not present

## 2012-08-13 DIAGNOSIS — R109 Unspecified abdominal pain: Secondary | ICD-10-CM

## 2012-08-13 LAB — CBC WITH DIFFERENTIAL/PLATELET
Basophils Absolute: 0 10*3/uL (ref 0.0–0.1)
Eosinophils Absolute: 0.1 10*3/uL (ref 0.0–0.5)
HCT: 35.8 % (ref 34.8–46.6)
HGB: 12.6 g/dL (ref 11.6–15.9)
NEUT#: 4.8 10*3/uL (ref 1.5–6.5)
NEUT%: 77.3 % — ABNORMAL HIGH (ref 38.4–76.8)
RDW: 12.1 % (ref 11.2–14.5)
lymph#: 0.9 10*3/uL (ref 0.9–3.3)

## 2012-08-13 LAB — COMPREHENSIVE METABOLIC PANEL (CC13)
Albumin: 3.9 g/dL (ref 3.5–5.0)
BUN: 11 mg/dL (ref 7.0–26.0)
CO2: 26 mEq/L (ref 22–29)
Calcium: 9.8 mg/dL (ref 8.4–10.4)
Chloride: 106 mEq/L (ref 98–107)
Creatinine: 0.7 mg/dL (ref 0.6–1.1)
Glucose: 93 mg/dl (ref 70–99)
Potassium: 3.9 mEq/L (ref 3.5–5.1)

## 2012-08-13 MED ORDER — LETROZOLE 2.5 MG PO TABS: 2.5000 mg | ORAL_TABLET | Freq: Every day | ORAL | Status: DC

## 2012-08-13 NOTE — Progress Notes (Signed)
ID: Crystal [name]   DOB: 1946-07-23  MR#: [id number]  CSN#:[id number]  HISTORY OF PRESENT ILLNESS: The patient saw Dr. Huel Cote for routine gynecologic followup and she noted a palpable mass in the right breast she said Crystal [name] up for right mammography and ultrasonography, performed at the breast center 01/29/2012. The breasts were heterogeneously dense. It mass with dense calcification was noted centrally in the right breast consistent with a benign fibroadenoma. The new palpable mass was not well demonstrated by mammography. It was firm, mobile, and measured approximately 1 cm by palpation. Ultrasound showed an irregularly marginated hypoechoic mass in this area, measuring 1.1 cm.  Biopsy was performed the same day, and showed [id number]) an invasive lobular breast cancer, e-cadherin negative,grade 1, estrogen and progesterone receptor positive, both of 100%, with an MIB-1 of 14%, and no HER-2 amplification.  The patient underwent a left diagnostic mammography 01/30/2012 and bilateral breast MRI 0415/2013.there were no other areas of concern in either breast, and there was no adenopathy noted. By MRI of the right breast mass measured 1.4 cm. Her subsequent history is as detailed below.  INTERVAL HISTORY: Crystal [name] returns today with her "shopping friend" for followup of her breast cancer. She had been started on anastrozole, then developed abdominal discomfort and that medication was stopped. She had elevated liver function tests at that time. We decided to wait until all that cleared before trying something else. She is here today to discuss that.  REVIEW OF SYSTEMS: She is pretty much "back to baseline". It does have some fatigue largely because she sleeps poorly: She wakes up several times a night, some times to urinate, sometimes "I just wake up". She has mild sinus symptoms which are common this time of year, she has a history of palpitations and she does drink quite a bit of  caffeine she tells me, she keeps a dry cough, no fever, no pleurisy, no hemoptysis, and her appetite is poor. She has urinary leakage. She feels forgetful and anxious. Otherwise a detailed review of systems is noncontributory.  PAST MEDICAL HISTORY: Past Medical History  Diagnosis Date  . Abdominal aortic ectasia 01/31/2010    Qualifier: Diagnosis of  By: Crystal Bud MD, Crystal [name] ABDOMINAL PAIN RIGHT UPPER QUADRANT 11/07/2010    Qualifier: Diagnosis of  By: Crystal Motto MD Crystal [name] CEREBROVASCULAR DISEASE 06/16/2010    Qualifier: Diagnosis of  By: Crystal Ruiz MD, Crystal [name]   . CONSTIPATION 01/12/2009    Qualifier: Diagnosis of  By: Crystal [name] CMA (AAMA), Crystal [name]    . Cough 08/15/2010    Qualifier: Diagnosis of  By: Crystal Motto MD Crystal [name] DETRUSOR, OVERACTIVE 03/02/2010    Qualifier: Diagnosis of  By: Crystal Bud MD, Crystal [name] ESOPHAGEAL STRICTURE 01/12/2009    Qualifier: Diagnosis of  By: Crystal [name] CMA (AAMA), Crystal [name]    . EXTERNAL HEMORRHOIDS 01/12/2009    Qualifier: Diagnosis of  By: Crystal [name] CMA (AAMA), Crystal [name]    . GERD 05/10/2007    Qualifier: Diagnosis of  By: Crystal [name], Crystal [name]    . HYPERLIPIDEMIA 05/10/2007    Qualifier: Diagnosis of  By: Crystal [name], Crystal [name]    . HYPERTENSION 05/10/2007    Qualifier: Diagnosis of  By: Crystal [name], Crystal [name]    . HYPOTENSION, ORTHOSTATIC 06/16/2010    Qualifier: Diagnosis of  By: Crystal Ruiz MD, Crystal [name]   . Irritable bowel syndrome 01/12/2009    Qualifier: Diagnosis of  By: Crystal [name] CMA (AAMA), Crystal [name]    .  MELANOSIS COLI 02/16/2009    Qualifier: Diagnosis of  By: Crystal [name] CMA (AAMA), Crystal [name]    . PERIPHERAL VASCULAR DISEASE 06/17/2010    Qualifier: Diagnosis of  By: Crystal Ruiz MD, Crystal [name]   . RECTAL FISSURE 02/16/2009    Qualifier: Diagnosis of  By: Crystal [name] CMA (AAMA), Crystal [name]    . STENOSIS, RECTAL 02/16/2009    Qualifier: Diagnosis of  By: Crystal [name] CMA (AAMA), Crystal [name]    . SYNCOPE 04/19/2008    Qualifier: Diagnosis of  By: Crystal Bud MD, Crystal [name]   . TACHYARRHYTHMIA 09/03/2009    Qualifier:  Diagnosis of  By: Crystal Bud MD, Crystal [name] TRANSIENT ISCHEMIC ATTACK 03/02/2010    Qualifier: Diagnosis of  By: Crystal Bud MD, Crystal [name] VENOUS INSUFFICIENCY, LEGS 07/18/2010    Qualifier: Diagnosis of  By: Crystal Bud MD, Crystal [name]   . Eosinophilic esophagitis   . Dysphagia   . Orthostatic hypotension   . Overactive detrusor   . Dizziness and giddiness   . Sinusitis acute   . Personal history of other diseases of digestive system   . Anal or rectal pain   . Other malaise and fatigue   . Adjustment disorder with depressed mood   . Routine general medical examination at a health care facility   . Breast cancer 03/11/12    right breast lumpectomy=invasive lobular ca,grade I/III,ER?PR=positive  . Radiation 04/21/2012-06/11/2012    64 gray right breast    PAST SURGICAL HISTORY: Past Surgical History  Procedure Date  . Appendectomy   . Abdominal hysterectomy   . Hemmorhoidectomy   . Cardiac catherization   . Esophageal tumor excised; posterior 1986   . Electrocardiogram 10/30/2006  . Edg 04/21/2007  . Breast surgery     left breast x 2  . Cardiac catheterization     FAMILY HISTORY Family History  Problem Relation Age of Onset  . Bone cancer Father   . Cancer Father     Bone  . Heart disease Brother   . Colon cancer Neg Hx   . Breast cancer Neg Hx   . Diabetes Neg Hx   . Anesthesia problems Neg Hx   The patient's father died from "bone cancer" at the age of 8. The patient's mother died in an automobile accident at the age of 63. The patient had one brother who died from a heart attack at the age of 58. The patient had no sisters.  GYNECOLOGIC HISTORY: Menarche age 17, she is GX P1 with first live birth at age 24. She underwent to the change of life approximately 1978. She did not take hormone replacement.  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 Her husband died in a plane crash approximately 5 years ago. Her son, Crystal [name], 37, works in [employer]  as an Personnel officer. The patient has no grandchildren. She lives alone with her cat Nicholaus Bloom.  ADVANCED DIRECTIVES: in place per Dr Crystal [name]' note  HEALTH MAINTENANCE: History  Substance Use Topics  . Smoking status: Former Smoker    Quit date: 10/22/1965  . Smokeless tobacco: Never Used  . Alcohol Use: 1.8 oz/week    3 Glasses of wine per week     occasional      Colonoscopy: 2010  PAP: 2011  Bone density: 2011/ osteopenia  Lipid panel: UTD per Dr Crystal [name]  No Known Allergies  Current Outpatient Prescriptions  Medication Sig Dispense Refill  . amLODipine (NORVASC) 5 MG tablet Take 5 mg by  mouth daily.      Marland Kitchen aspirin 325 MG EC tablet Take 325 mg by mouth daily.      Marland Kitchen emollient (BIAFINE) cream Apply topically 2 (two) times daily.      . polyethylene glycol (MIRALAX / GLYCOLAX) packet Take 17 g by mouth 3 (three) times a week.      . Senna-Psyllium (PERDIEM PO) Take 2 tablets by mouth at bedtime.        OBJECTIVE: elderly white woman in no acute distress Filed Vitals:   08/13/12 0943  BP: 136/81  Pulse: 83  Temp: 98.3 F (36.8 C)  Resp: 20     Body mass index is 19.06 kg/(m^2).    ECOG FS:0 Filed Weights   08/13/12 0943  Weight: 104 lb 3.2 oz (47.265 kg)   Sclerae unicteric Oropharynx clear No cervical or supraclavicular adenopathy Lungs no rales or rhonchi Heart irregular rhythm, regular rate. No murmur appreciated Abd soft, nondistended, nontender, positive bowel sounds MSK no focal spinal tenderness, no peripheral edema Neuro: nonfocal, alert and oriented x3 Breasts: The right breast is status post lumpectomy. There is no evidence of local recurrence. Left breast is unremarkable.  LAB RESULTS: Lab Results  Component Value Date   WBC 6.2 08/13/2012   NEUTROABS 4.8 08/13/2012   HGB 12.6 08/13/2012   HCT 35.8 08/13/2012   MCV 95.2 08/13/2012   PLT 207 08/13/2012      Chemistry      Component Value Date/Time   NA 143 08/13/2012 0917   NA 138 06/19/2012 0450    K 3.9 08/13/2012 0917   K 3.6 06/19/2012 0450   CL 106 08/13/2012 0917   CL 100 06/19/2012 0450   CO2 26 08/13/2012 0917   CO2 28 06/19/2012 0450   BUN 11.0 08/13/2012 0917   BUN 9 06/19/2012 0450   CREATININE 0.7 08/13/2012 0917   CREATININE 0.57 06/19/2012 0450      Component Value Date/Time   CALCIUM 9.8 08/13/2012 0917   CALCIUM 10.7* 06/19/2012 0450   ALKPHOS 109 08/13/2012 0917   ALKPHOS 232* 06/19/2012 0450   AST 18 08/13/2012 0917   AST 834* 06/19/2012 0450   ALT 16 08/13/2012 0917   ALT 443* 06/19/2012 0450   BILITOT 0.90 08/13/2012 0917   BILITOT 1.4* 06/19/2012 0450      Lab Results  Component Value Date   LABCA2 10 02/06/2012    STUDIES:  Ct Chest W Contrast  07/02/2012  *RADIOLOGY REPORT*  Clinical Data: Followup right lower lobe nodule.  Recent breast cancer diagnosis.  CT CHEST WITH CONTRAST  Technique:  Multidetector CT imaging of the chest was performed following the standard protocol during bolus administration of intravenous contrast.  Contrast: 80mL OMNIPAQUE IOHEXOL 300 MG/ML  SOLN  Comparison: CT abdomen pelvis 06/19/2012.  Findings: Sub-centimeter thyroid nodule(s) noted, too small to characterize, but most likely benign in the absence of known clinical risk factors for thyroid carcinoma.  No pathologically enlarged mediastinal, hilar or axillary lymph nodes.  Esophagus is dilated proximally.  Coronary artery calcification.  Heart size normal.  No pericardial effusion. Postoperative changes are seen in the right breast.  Biapical pleural parenchymal scarring.  There are areas of bronchiectasis and mucoid impaction in the peripheral right lower lobe.  Scattered tiny nodular densities are seen in the lungs bilaterally, most of which are peripheral in location and all of which are 4 mm or less in size.  No pleural fluid.  Airway is unremarkable.  No pleural fluid.  Airway is unremarkable.  Incidental imaging of the upper abdomen shows low attenuation lesions in the left  kidney, measuring up to 6 mm.  No worrisome lytic or sclerotic lesions.  IMPRESSION:  1.  Scattered tiny nodular densities in the lungs bilaterally, which are mostly peripheral in location. While a benign etiology is favored, in the setting of breast cancer, a short-term interval follow-up could be performed to ensure stability, as clinically indicated. 2.  Focal bronchiectasis and mucus plugging in the right lower lobe.   Original Report Authenticated By: Reyes Ivan, M.D.    US Abdomen Complete  06/19/2012  *RADIOLOGY REPORT*  Clinical Data:  Abdominal pain.  Elevated liver function test.  COMPLETE ABDOMINAL ULTRASOUND  Comparison:  CT abdomen and pelvis earlier same date.  Abdominal ultrasound 11/14/2010, 02/03/2010.  Urinary tract ultrasound 01/23/2008 [facility].  Findings:  Gallbladder:  Approximate 5 mm non-shadowing, non-mobile echogenic focus in the body of the gallbladder, consistent with a polyp, unchanged.  No shadowing gallstones or echogenic sludge.  No gallbladder wall thickening or pericholecystic fluid.  Negative sonographic Murphy's sign according to the ultrasound technologist.  Common bile duct:  Normal in caliber with maximum diameter approximating 2 mm.  Liver:  Normal size and echotexture without focal parenchymal abnormality.  Patent portal vein with hepatopetal flow.  IVC:  Patent.  Pancreas:  Although the pancreas is difficult to visualize in its entirety, no focal pancreatic abnormality is identified.  The pancreas was normal on the earlier CT.  Spleen:  Normal size and echotexture without focal parenchymal abnormality.  Right Kidney:  No hydronephrosis.  Numerous hypoechoic lesions throughout the kidney as noted on CT, the largest arising from the mid pole measuring approximately 0.8 x 1.0 x 0.8 cm.  Well- preserved cortex with normal parenchymal echotexture. Approximately 9.8 cm in length.  Left Kidney:  No hydronephrosis.  Numerous hypoechoic lesions throughout  the kidney as noted on CT, the largest arising from the mid kidney measuring 0.6 x 0.9 x 0.6 cm.  Well-preserved cortex with normal parenchymal echotexture.  Approximately 9.7 cm in length.  Abdominal aorta:  Normal in caliber throughout its visualized course in the abdomen with mild to moderate atherosclerosis.  IMPRESSION:  1.  5 mm gallbladder polyp.  No evidence of cholelithiasis or cholecystitis.  No biliary ductal dilation. 2.  Normal appearing liver by ultrasound. 3.  Numerous hypoechoic lesions throughout both kidneys consistent with cysts, as they have been present dating back to 2009 and are not significantly changed.   Original Report Authenticated By: Arnell Sieving, M.D.    Ct Abdomen Pelvis W Contrast  06/19/2012  *RADIOLOGY REPORT*  Clinical Data: Right lower quadrant pain  CT ABDOMEN AND PELVIS WITH CONTRAST  Technique:  Multidetector CT imaging of the abdomen and pelvis was performed following the standard protocol during bolus administration of intravenous contrast.  Contrast: OMNIPAQUE IOHEXOL 300 MG/ML  SOLN  Comparison: None  Findings: Partially imaged soft tissue and treatment changes of the right breast in this patient with known history of right breast cancer.  There is a partially imaged nodule on image 1 measuring about 5 mm.  Heterogeneous attenuation/reticular enhancement of the liver is nonspecific post contrast however can be seen with fatty infiltration or hepatitis. Unremarkable spleen, pancreas, biliary system, adrenal glands.  There are hypodensities within both kidneys, too small further characterize. No hydronephrosis or hydroureter.  No bowel obstruction.  Colonic diverticulosis.  No CT evidence for colitis or diverticulitis. The appendix appears  short and decompressed.  No free intraperitoneal air or fluid.  No lymphadenopathy.  There is scattered atherosclerotic calcification of the aorta and its branches. No aneurysmal dilatation.  Thin-walled bladder.  Absent  uterus.  No adnexal mass. Prominent left pelvic collaterals.  Multilevel degenerative changes of the imaged spine. No acute or aggressive appearing osseous lesion.  IMPRESSION:  No CT evidence for appendicitis.  Heterogeneous hepatic enhancement can be seen in the setting of hepatitis or fatty infiltration.  Bilateral renal hypodensities are incompletely characterized. Recommend comparison with prior outside studies for stability (no prior abdominal CT in our system). If unavailable, recommend renal MRI.  Partially imaged right breast mass/ post treatment change.  Incompletely imaged 5 mm nodule within the right lower lobe. Recommend comparison with prior imaging and dedicated chest CT follow-up if unavailable.   Original Report Authenticated By: Waneta Martins, M.D.     ASSESSMENT: 66 year old [place] woman status post right lumpectomy 03/11/2012 for a pT1c N0, stage IA, invasive lobular breast cancer, grade 1, with a broadly positive margin which according to surgery could not be cleared short of mastectomy. The tumor is strongly estrogen and progesterone receptor positive, with a low Mib-1 and no evidence of HER-2 amplification. The Oncotype recurrence score is 23, predicting a 15% chance of distant recurrence with adjuvant tamoxifen. The Adjuvant! Program predicts a risk of recurrence of 17%, dropping to 9% with aromatase inhibitors.  (1) completed adjuvant irradiation 06/11/2012  (2) started anastrozole 06/11/2012 and held since 06/19/2012 due to acute abdominal pain and elevated LFTs  (3) osteoporosis  PLAN:  We went over her situation in general and she understands it is highly advisable for her to take anti-estrogens for at least 5 years. I do not believe the anastrozole was the cause of whenever the problem was in her abdomen, but at any rate currently she has no symptoms and her liver function tests are back to normal. Accordingly we're going to give letrozole a try. If the same problems  occur, we would have to switch to either exemestane or tamoxifen.  She understands the possible toxicities side effects and complications of letrozole. I wrote her a prescription for 3 months with one years worth of refills. She will see [place] again in 3 months and to see how she is tolerating this. At that time, if she tolerates it well, we will consider bisphosphonates.  MAGRINAT,GUSTAV C    08/13/2012

## 2012-08-13 NOTE — Telephone Encounter (Signed)
11-13-2012 GAVE PATIENT APPOINTMENT WITH AMY BERRY STARTING AT 10:45

## 2012-10-22 HISTORY — PX: BREAST BIOPSY: SHX20

## 2012-11-04 ENCOUNTER — Ambulatory Visit
Admission: RE | Admit: 2012-11-04 | Discharge: 2012-11-04 | Disposition: A | Discharge status: Home / Self Care | Payer: Medicare Other | Source: Ambulatory Visit | Attending: General Surgery | Admitting: General Surgery

## 2012-11-04 ENCOUNTER — Other Ambulatory Visit (INDEPENDENT_AMBULATORY_CARE_PROVIDER_SITE_OTHER): Payer: Self-pay | Admitting: General Surgery

## 2012-11-04 DIAGNOSIS — Z853 Personal history of malignant neoplasm of breast: Secondary | ICD-10-CM

## 2012-11-04 DIAGNOSIS — R922 Inconclusive mammogram: Secondary | ICD-10-CM | POA: Diagnosis not present

## 2012-11-06 ENCOUNTER — Other Ambulatory Visit: Payer: Self-pay | Admitting: Internal Medicine

## 2012-11-12 ENCOUNTER — Other Ambulatory Visit (INDEPENDENT_AMBULATORY_CARE_PROVIDER_SITE_OTHER): Payer: Self-pay | Admitting: General Surgery

## 2012-11-12 DIAGNOSIS — Z853 Personal history of malignant neoplasm of breast: Secondary | ICD-10-CM

## 2012-11-13 ENCOUNTER — Other Ambulatory Visit (HOSPITAL_BASED_OUTPATIENT_CLINIC_OR_DEPARTMENT_OTHER): Payer: Medicare Other | Admitting: Lab

## 2012-11-13 ENCOUNTER — Telehealth: Payer: Self-pay | Admitting: Oncology

## 2012-11-13 ENCOUNTER — Encounter: Payer: Self-pay | Admitting: Physician Assistant

## 2012-11-13 ENCOUNTER — Ambulatory Visit (HOSPITAL_BASED_OUTPATIENT_CLINIC_OR_DEPARTMENT_OTHER): Payer: Medicare Other | Admitting: Physician Assistant

## 2012-11-13 VITALS — BP 156/74 | HR 85 | Temp 98.1°F | Resp 20 | Ht 62.0 in | Wt 104.3 lb

## 2012-11-13 DIAGNOSIS — C50319 Malignant neoplasm of lower-inner quadrant of unspecified female breast: Secondary | ICD-10-CM

## 2012-11-13 DIAGNOSIS — C50919 Malignant neoplasm of unspecified site of unspecified female breast: Secondary | ICD-10-CM

## 2012-11-13 DIAGNOSIS — Z170 Estrogen receptor positive status [ER+]: Secondary | ICD-10-CM | POA: Diagnosis not present

## 2012-11-13 DIAGNOSIS — F419 Anxiety disorder, unspecified: Secondary | ICD-10-CM

## 2012-11-13 DIAGNOSIS — R51 Headache: Secondary | ICD-10-CM | POA: Diagnosis not present

## 2012-11-13 DIAGNOSIS — R519 Headache, unspecified: Secondary | ICD-10-CM

## 2012-11-13 DIAGNOSIS — M810 Age-related osteoporosis without current pathological fracture: Secondary | ICD-10-CM

## 2012-11-13 LAB — COMPREHENSIVE METABOLIC PANEL (CC13)
AST: 20 U/L (ref 5–34)
Albumin: 3.9 g/dL (ref 3.5–5.0)
Alkaline Phosphatase: 101 U/L (ref 40–150)
Potassium: 3.6 mEq/L (ref 3.5–5.1)
Sodium: 143 mEq/L (ref 136–145)
Total Bilirubin: 0.88 mg/dL (ref 0.20–1.20)
Total Protein: 6.9 g/dL (ref 6.4–8.3)

## 2012-11-13 LAB — CBC WITH DIFFERENTIAL/PLATELET
Basophils Absolute: 0 10*3/uL (ref 0.0–0.1)
EOS%: 1.2 % (ref 0.0–7.0)
HGB: 12.2 g/dL (ref 11.6–15.9)
MCH: 32.7 pg (ref 25.1–34.0)
MONO#: 0.4 10*3/uL (ref 0.1–0.9)
NEUT#: 5.6 10*3/uL (ref 1.5–6.5)
RDW: 12.5 % (ref 11.2–14.5)
WBC: 7 10*3/uL (ref 3.9–10.3)
lymph#: 0.9 10*3/uL (ref 0.9–3.3)

## 2012-11-13 NOTE — Telephone Encounter (Signed)
gv pt appt schedule for March. Pt also had lb/ct in March. Per cx March lb and move ct to 3/10. Ct moved to 3/4 as 3/10 would have caused another lb draw. Lb only good for ct for 6wks. Pt also given mri appt for 1/24 @ 9pm @ [facility] (evnening/night). Pt aware she is to arrive @ the mri dept @ 8:30pm.

## 2012-11-13 NOTE — Progress Notes (Signed)
ID: [name]   DOB: 1945-11-10  MR#: [id number]  CSN#:[id number]  HISTORY OF PRESENT ILLNESS: The patient saw Dr. Huel Cote for routine gynecologic followup and she noted a palpable mass in the right breast she said Crystal [name] up for right mammography and ultrasonography, performed at the breast center 01/29/2012. The breasts were heterogeneously dense. It mass with dense calcification was noted centrally in the right breast consistent with a benign fibroadenoma. The new palpable mass was not well demonstrated by mammography. It was firm, mobile, and measured approximately 1 cm by palpation. Ultrasound showed an irregularly marginated hypoechoic mass in this area, measuring 1.1 cm.  Biopsy was performed the same day, and showed [phone]) an invasive lobular breast cancer, e-cadherin negative,grade 1, estrogen and progesterone receptor positive, both of 100%, with an MIB-1 of 14%, and no HER-2 amplification.  The patient underwent a left diagnostic mammography 01/30/2012 and bilateral breast MRI 0415/2013.there were no other areas of concern in either breast, and there was no adenopathy noted. By MRI of the right breast mass measured 1.4 cm. Her subsequent history is as detailed below.  INTERVAL HISTORY: Crystal [name] returns today for followup of her right breast cancer. Recall that Crystal [name] had been on anastrozole very briefly in August, that medication was discontinued due to to increased liver enzymes and abdominal pain. Since October 2013 she has been on letrozole, but she has developed several side effects she associates with that medication.  Crystal [name] has been increasingly anxious and is having difficulty sleeping. She tells me, "that is just not me".  She feels more emotional, and "cries at the drop of a hat". She's had increased joint pain which she finds very troublesome. She's having lower back pain. She has cramps in her right calf.  She denies any swelling. She fills more weak  and fatigued. In fact, she specifically feels that her right arm and leg are weaker than the left. Her appetite is decreased.    She also complains of what she describes as "brain freeze  headaches" occurring in the right temple. These occur several times weekly, with no obvious pattern, and are brief.  She denies any associated dizziness, change in vision, or nausea. She does tell me she fainted last week during a mammogram, but associates that to the pain and discomfort during the procedure. She's had no additional problems with loss of consciousness, no gait disturbance, and no seizure activity.   Interval history is also remarkable for Crystal [name] having had a bilateral mammogram and right breast ultrasound on 11/04/2012 notable for 2 masslike areas in the right breast, 1 in the 12:00 position, 1 in the 3:00 position. These were thought to be likely postsurgical scarring and radiation fibrosis. The results were reviewed by Dr. Biagio Quint who has requested a biopsy, and this is in the process of being scheduled. (Details of the mammogram/ultrasound are detailed below.)  REVIEW OF SYSTEMS: Crystal [name] denies any fevers or chills. She has occasional night sweats but no significant hot flashes during the day. She's having regular bowel movements, no diarrhea or constipation. She feels like her heart "flutters" sometimes and this causes her to cough. She denies, however, any actual chest pain and has had no pleurisy or shortness of breath.  She noted this after beginning the letrozole.  A detailed review of systems is otherwise stable and noncontributory.   PAST MEDICAL HISTORY: Past Medical History  Diagnosis Date  . Abdominal aortic ectasia 01/31/2010    Qualifier: Diagnosis of  By: Debby Bud MD,  Rosalyn Gess ABDOMINAL PAIN RIGHT UPPER QUADRANT 11/07/2010    Qualifier: Diagnosis of  By: Jarold Motto MD Lang Snow CEREBROVASCULAR DISEASE 06/16/2010    Qualifier: Diagnosis of  By: Jonny Ruiz MD, Len Blalock   .  CONSTIPATION 01/12/2009    Qualifier: Diagnosis of  By: Koleen Distance CMA (AAMA), Hulan Saas    . Cough 08/15/2010    Qualifier: Diagnosis of  By: Jarold Motto MD Lang Snow DETRUSOR, OVERACTIVE 03/02/2010    Qualifier: Diagnosis of  By: Debby Bud MD, Rosalyn Gess ESOPHAGEAL STRICTURE 01/12/2009    Qualifier: Diagnosis of  By: Koleen Distance CMA (AAMA), Hulan Saas    . EXTERNAL HEMORRHOIDS 01/12/2009    Qualifier: Diagnosis of  By: Koleen Distance CMA (AAMA), Hulan Saas    . GERD 05/10/2007    Qualifier: Diagnosis of  By: Charlsie Quest RMA, Lucy    . HYPERLIPIDEMIA 05/10/2007    Qualifier: Diagnosis of  By: Tyrone Apple, Lucy    . HYPERTENSION 05/10/2007    Qualifier: Diagnosis of  By: Tyrone Apple, Lucy    . HYPOTENSION, ORTHOSTATIC 06/16/2010    Qualifier: Diagnosis of  By: Jonny Ruiz MD, Len Blalock   . Irritable bowel syndrome 01/12/2009    Qualifier: Diagnosis of  By: Koleen Distance CMA (AAMA), Hulan Saas    . MELANOSIS COLI 02/16/2009    Qualifier: Diagnosis of  By: Koleen Distance CMA (AAMA), Hulan Saas    . PERIPHERAL VASCULAR DISEASE 06/17/2010    Qualifier: Diagnosis of  By: Jonny Ruiz MD, Len Blalock   . RECTAL FISSURE 02/16/2009    Qualifier: Diagnosis of  By: Koleen Distance CMA (AAMA), Hulan Saas    . STENOSIS, RECTAL 02/16/2009    Qualifier: Diagnosis of  By: Koleen Distance CMA (AAMA), Hulan Saas    . SYNCOPE 04/19/2008    Qualifier: Diagnosis of  By: Debby Bud MD, Rosalyn Gess   . TACHYARRHYTHMIA 09/03/2009    Qualifier: Diagnosis of  By: Debby Bud MD, Rosalyn Gess TRANSIENT ISCHEMIC ATTACK 03/02/2010    Qualifier: Diagnosis of  By: Debby Bud MD, Rosalyn Gess VENOUS INSUFFICIENCY, LEGS 07/18/2010    Qualifier: Diagnosis of  By: Debby Bud MD, Rosalyn Gess   . Eosinophilic esophagitis   . Dysphagia   . Orthostatic hypotension   . Overactive detrusor   . Dizziness and giddiness   . Sinusitis acute   . Personal history of other diseases of digestive system   . Anal or rectal pain   . Other malaise and fatigue   . Adjustment disorder with depressed mood   . Routine general medical examination at a health  care facility   . Breast cancer 03/11/12    right breast lumpectomy=invasive lobular ca,grade I/III,ER?PR=positive  . Radiation 04/21/2012-06/11/2012    64 gray right breast    PAST SURGICAL HISTORY: Past Surgical History  Procedure Date  . Appendectomy   . Abdominal hysterectomy   . Hemmorhoidectomy   . Cardiac catherization   . Esophageal tumor excised; posterior 1986   . Electrocardiogram 10/30/2006  . Edg 04/21/2007  . Breast surgery     left breast x 2  . Cardiac catheterization     FAMILY HISTORY Family History  Problem Relation Age of Onset  . Bone cancer Father   . Cancer Father     Bone  . Heart disease Brother   . Colon cancer Neg Hx   . Breast cancer Neg Hx   . Diabetes Neg Hx   . Anesthesia problems Neg Hx   The  patient's father died from "bone cancer" at the age of 4. The patient's mother died in an automobile accident at the age of 47. The patient had one brother who died from a heart attack at the age of 67. The patient had no sisters.  GYNECOLOGIC HISTORY: Menarche age 84, she is GX P1 with first live birth at age 51. She underwent to the change of life approximately 1978. She did not take hormone replacement.  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 Her husband died in a plane crash approximately 5 years ago. Her son, Jalessa Peyser, 37, works in [employer] as an Personnel officer. The patient has no grandchildren. She lives alone with her cat Nicholaus Bloom.  ADVANCED DIRECTIVES: in place per Dr Debby Bud' note  HEALTH MAINTENANCE: History  Substance Use Topics  . Smoking status: Former Smoker    Quit date: 10/22/1965  . Smokeless tobacco: Never Used  . Alcohol Use: 1.8 oz/week    3 Glasses of wine per week     Comment: occasional      Colonoscopy: 2010  PAP: 2011  Bone density: 2011/ osteopenia  Lipid panel: UTD per Dr Debby Bud  No Known Allergies  Current Outpatient Prescriptions  Medication Sig Dispense Refill  . amLODipine  (NORVASC) 5 MG tablet TAKE ONE TABLET BY MOUTH ONE TIME DAILY  30 tablet  5  . aspirin 325 MG EC tablet Take 325 mg by mouth daily.      Marland Kitchen letrozole (FEMARA) 2.5 MG tablet Take 1 tablet (2.5 mg total) by mouth daily.  90 tablet  12  . polyethylene glycol (MIRALAX / GLYCOLAX) packet Take 17 g by mouth 3 (three) times a week.      . Senna-Psyllium (PERDIEM PO) Take 2 tablets by mouth at bedtime.        OBJECTIVE: elderly white woman who appears tired and anxious Filed Vitals:   11/13/12 1102  BP: 156/74  Pulse: 85  Temp: 98.1 F (36.7 C)  Resp: 20     Body mass index is 19.08 kg/(m^2).    ECOG FS:1 Filed Weights   11/13/12 1102  Weight: 104 lb 4.8 oz (47.31 kg)   Sclerae unicteric Oropharynx clear No cervical or supraclavicular adenopathy Lungs no rales or rhonchi Heart irregular rhythm, regular rate. No murmur appreciated Abd soft, nondistended, nontender, positive bowel sounds MSK no focal spinal tenderness, no peripheral edema Neuro: nonfocal, cranial nerves grossly intact.  Alert and oriented to person place and time with anxious affect. Strength is 3+ out of 5 in the right upper extremity, 5 out of 5 in the left upper extremity, and 5 out of 5 bilaterally in the lower extremities. Breasts: The right breast is status post lumpectomy. There is firmness palpated at the 12:00 position. Left breast is unremarkable. Axillae benign bilaterally, no adenopathy palpated.  LAB RESULTS: Lab Results  Component Value Date   WBC 7.0 11/13/2012   NEUTROABS 5.6 11/13/2012   HGB 12.2 11/13/2012   HCT 34.8 11/13/2012   MCV 93.0 11/13/2012   PLT 225 11/13/2012      Chemistry      Component Value Date/Time   NA 143 11/13/2012 1052   NA 138 06/19/2012 0450   K 3.6 11/13/2012 1052   K 3.6 06/19/2012 0450   CL 104 11/13/2012 1052   CL 100 06/19/2012 0450   CO2 29 11/13/2012 1052   CO2 28 06/19/2012 0450   BUN 15.0 11/13/2012 1052   BUN 9 06/19/2012  0450   CREATININE 0.8 11/13/2012 1052   CREATININE  0.57 06/19/2012 0450      Component Value Date/Time   CALCIUM 9.8 11/13/2012 1052   CALCIUM 10.7* 06/19/2012 0450   ALKPHOS 101 11/13/2012 1052   ALKPHOS 232* 06/19/2012 0450   AST 20 11/13/2012 1052   AST 834* 06/19/2012 0450   ALT 16 11/13/2012 1052   ALT 443* 06/19/2012 0450   BILITOT 0.88 11/13/2012 1052   BILITOT 1.4* 06/19/2012 0450      Lab Results  Component Value Date   LABCA2 10 02/06/2012    STUDIES:  US Breast Right  11/04/2012  *RADIOLOGY REPORT*  Clinical Data:  Firm, palpable mass-like area in the upper and inner right breast since a lumpectomy for breast cancer on 03/11/2012, followed by radiation therapy.  The breast cancer was posteriorly located with positive posterior margins.  The patient reports that the palpable mass-like area has remained unchanged since her surgery.  DIGITAL DIAGNOSTIC BILATERAL MAMMOGRAM WITH CAD AND RIGHT BREAST ULTRASOUND:  Comparison:  Previous examinations.  Findings:  ACR Breast Density Category 3: The breast tissue is heterogeneously dense.  Interval post lumpectomy changes on the right with postsurgical scarring and deformity and more homogeneously increased parenchymal density in the central breast centered somewhat superiorly and laterally.  Stable small, rounded mass containing a biopsy marker clip in the outer left breast with no new findings on the left suspicious for malignancy.  Mammographic images were processed with CAD.  On physical exam, the patient has an approximately 5 cm firm, hard, mass-like area of palpable soft tissue thickening in the superior periareolar region of the right breast and extending into the lower inner portion of the breast.  Ultrasound is performed, showing an irregular, hypoechoic, mass- like area in the 12 o'clock subareolar region of the right breast, measuring 2.8 x 1.9 x 1.7 cm in maximum dimensions.  This extends to the nipple.  There is also a similar appearing area in the 3 o'clock position of the right breast, 2  cm from the nipple, measuring 2.0 x 1.8 x 1.0 cm in maximum dimensions.  IMPRESSION: Two mass-like areas of probable postsurgical scarring and radiation fibrosis in the 12 o'clock subareolar and 3 o'clock positions of the right breast, as described above.  No evidence of malignancy on the left.  RECOMMENDATION: Unless ultrasound-guided core needle biopsy of these areas is clinically indicated or desired by the patient at this time, a six month followup right breast ultrasound would be recommended.  Also recommended is consideration of a pre and postcontrast magnetic resonance imaging examination of the breasts at this time to evaluate these areas and to evaluate the area with positive posterior margins at lumpectomy.  I have discussed the findings and recommendations with the patient. Results were also provided in writing at the conclusion of the visit.  BI-RADS CATEGORY 3:  Probably benign finding(s) - short interval follow-up suggested.   Original Report Authenticated By: Beckie Salts, M.D.      ASSESSMENT: 68 year old [place] woman status post right lumpectomy 03/11/2012 for a pT1c N0, stage IA, invasive lobular breast cancer, grade 1, with a broadly positive margin which according to surgery could not be cleared short of mastectomy. The tumor is strongly estrogen and progesterone receptor positive, with a low Mib-1 and no evidence of HER-2 amplification. The Oncotype recurrence score is 23, predicting a 15% chance of distant recurrence with adjuvant tamoxifen. The Adjuvant! Program predicts a risk of recurrence of 17%, dropping to 9%  with aromatase inhibitors.  (1) completed adjuvant irradiation 06/11/2012  (2) started anastrozole 06/11/2012 and held since 06/19/2012 due to acute abdominal pain and elevated LFTs.    (3)  Started letrozole in October 2013, continuing until January 2014 with poor tolerance  (4) osteoporosis  PLAN:  This case was reviewed with Dr. Darnelle Catalan who is also spoken with  the patient today. We are going to hold her letrozole, and many of the complaint she has today could be associated with that drug.  We'll see her back in 6 weeks to reevaluate. If her symptoms have improved significantly, we will consider exemestane. We would want to avoid tamoxifen due to a history of TIAs. If her symptoms have not improved, however, we will likely go back to letrozole. We will also need to discuss bisphosphonate therapy.  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 If the MRI is unremarkable, we will consider a referral to neurology for further evaluation and possible therapy with an antiseizure medication.  As noted above, Crystal [name] is also being scheduled for a biopsy of the abnormality in the right breast, and this has been ordered by Dr. Biagio Quint. We will also have those results to review when she returns in 6 weeks.  Crystal [name] is comfortable with this plan in voices her understanding and agreement. She will call with any changes or problems prior to her next scheduled appointment.  I will make mention of the fact that a chest CT in September 2013 showed scattered tiny nodular densities in the lungs bilaterally, and a short-term interval followup was recommended. Crystal [name] had already been scheduled for a repeat chest CT in mid March, and we will move this up by one week so that the results will be back by the time she sees Dr. Darnelle Catalan.   Journey Castonguay    11/13/2012

## 2012-11-14 ENCOUNTER — Ambulatory Visit (HOSPITAL_COMMUNITY)
Admission: RE | Admit: 2012-11-14 | Discharge: 2012-11-14 | Disposition: A | Discharge status: Home / Self Care | Payer: Medicare Other | Source: Ambulatory Visit | Attending: Physician Assistant | Admitting: Physician Assistant

## 2012-11-14 ENCOUNTER — Other Ambulatory Visit: Payer: Self-pay | Admitting: Physician Assistant

## 2012-11-14 ENCOUNTER — Telehealth (INDEPENDENT_AMBULATORY_CARE_PROVIDER_SITE_OTHER): Payer: Self-pay

## 2012-11-14 DIAGNOSIS — Q762 Congenital spondylolisthesis: Secondary | ICD-10-CM | POA: Diagnosis not present

## 2012-11-14 DIAGNOSIS — Z8673 Personal history of transient ischemic attack (TIA), and cerebral infarction without residual deficits: Secondary | ICD-10-CM | POA: Diagnosis not present

## 2012-11-14 DIAGNOSIS — Z853 Personal history of malignant neoplasm of breast: Secondary | ICD-10-CM | POA: Insufficient documentation

## 2012-11-14 DIAGNOSIS — R51 Headache: Secondary | ICD-10-CM | POA: Insufficient documentation

## 2012-11-14 DIAGNOSIS — C50319 Malignant neoplasm of lower-inner quadrant of unspecified female breast: Secondary | ICD-10-CM

## 2012-11-14 MED ORDER — GADOBENATE DIMEGLUMINE 529 MG/ML IV SOLN: 9.0000 mL | Freq: Once | INTRAVENOUS | Status: DC | PRN

## 2012-11-14 MED ORDER — GADOBENATE DIMEGLUMINE 529 MG/ML IV SOLN
9.0000 mL | Freq: Once | INTRAVENOUS | Status: AC | PRN
  Administered 2012-11-14: 9 mL via INTRAVENOUS

## 2012-11-14 NOTE — Telephone Encounter (Signed)
Patient notified of Breast Biopsy appointment scheduled for Wed 11/26/12 @ 8:00 am with arrival time @ 7:45 [facility].

## 2012-11-26 ENCOUNTER — Other Ambulatory Visit (INDEPENDENT_AMBULATORY_CARE_PROVIDER_SITE_OTHER): Payer: Self-pay | Admitting: General Surgery

## 2012-11-26 ENCOUNTER — Ambulatory Visit
Admission: RE | Admit: 2012-11-26 | Discharge: 2012-11-26 | Disposition: A | Discharge status: Home / Self Care | Payer: Medicare Other | Source: Ambulatory Visit | Attending: General Surgery | Admitting: General Surgery

## 2012-11-26 DIAGNOSIS — Z853 Personal history of malignant neoplasm of breast: Secondary | ICD-10-CM

## 2012-11-26 DIAGNOSIS — D249 Benign neoplasm of unspecified breast: Secondary | ICD-10-CM | POA: Diagnosis not present

## 2012-11-26 DIAGNOSIS — N641 Fat necrosis of breast: Secondary | ICD-10-CM | POA: Diagnosis not present

## 2012-11-26 DIAGNOSIS — N6039 Fibrosclerosis of unspecified breast: Secondary | ICD-10-CM | POA: Diagnosis not present

## 2012-11-26 DIAGNOSIS — N63 Unspecified lump in unspecified breast: Secondary | ICD-10-CM | POA: Diagnosis not present

## 2012-11-26 DIAGNOSIS — N6489 Other specified disorders of breast: Secondary | ICD-10-CM | POA: Diagnosis not present

## 2012-12-10 ENCOUNTER — Encounter (INDEPENDENT_AMBULATORY_CARE_PROVIDER_SITE_OTHER): Payer: Self-pay | Admitting: General Surgery

## 2012-12-10 ENCOUNTER — Ambulatory Visit (INDEPENDENT_AMBULATORY_CARE_PROVIDER_SITE_OTHER): Payer: Medicare Other | Admitting: General Surgery

## 2012-12-10 VITALS — BP 127/86 | HR 74 | Temp 98.6°F | Resp 14 | Ht 62.0 in | Wt 104.4 lb

## 2012-12-10 DIAGNOSIS — Z853 Personal history of malignant neoplasm of breast: Secondary | ICD-10-CM | POA: Diagnosis not present

## 2012-12-10 NOTE — Progress Notes (Signed)
Subjective:     Patient ID: Crystal [name], female   DOB: 1946/04/08, 67 y.o.   MRN: [id number]  HPI This patient follows up status post right breast needle localized lumpectomy and sentinel lymph node biopsy performed in May of 2013. She had a positive muscular margin and she had followup radiation which she completed in August. She has been doing well and continues to do her self breast exam without any suspicious masses. She did have ultrasound and biopsy of a suspicious area last month and her pathology was benign. She otherwise denies any new headaches, bony pains, or weight loss. She does have some old back pain and is scheduled for a body scan next week. She is also followed by Dr. Darnelle Catalan but has stopped her hormones due to side effects. She is scheduled to see him back in a few weeks as well. She actually says that the area of concern that was recently biopsied has decreased in size.  Review of Systems     Objective:   Physical Exam No distress nontoxic. Her left breast is normal without any suspicious masses, skin changes or lymphadenopathy Her right breast shows normal postoperative changes without any suspicious lesions or lymphadenopathy. I do not appreciate the area of suspicion that we felt previously.    Assessment:     History of breast cancer She seems to be doing very well. She continues with her self breast exam and has not noticed any suspicious changes. Her pathology is benign from her recent biopsy. Her exam is normal today except for normal postoperative changes. I recommended that she continue with her  And routine self breast exams. She is also scheduled for mammograms in May to June timeframes and I recommended that she come back and see me at that time. If she remains without any suspicious lesions, then I would see her back and only after that.    Plan:     Continue monthly self breast exams and I will see her back in about June time frame after mammograms

## 2012-12-23 ENCOUNTER — Ambulatory Visit (HOSPITAL_COMMUNITY)
Admission: RE | Admit: 2012-12-23 | Discharge: 2012-12-23 | Disposition: A | Discharge status: Home / Self Care | Payer: Medicare Other | Source: Ambulatory Visit | Attending: Physician Assistant | Admitting: Physician Assistant

## 2012-12-23 DIAGNOSIS — R918 Other nonspecific abnormal finding of lung field: Secondary | ICD-10-CM | POA: Diagnosis not present

## 2012-12-23 DIAGNOSIS — Z853 Personal history of malignant neoplasm of breast: Secondary | ICD-10-CM | POA: Diagnosis not present

## 2012-12-23 DIAGNOSIS — R109 Unspecified abdominal pain: Secondary | ICD-10-CM

## 2012-12-23 DIAGNOSIS — J984 Other disorders of lung: Secondary | ICD-10-CM | POA: Insufficient documentation

## 2012-12-23 DIAGNOSIS — C50319 Malignant neoplasm of lower-inner quadrant of unspecified female breast: Secondary | ICD-10-CM | POA: Diagnosis not present

## 2012-12-23 MED ORDER — IOHEXOL 300 MG/ML  SOLN
80.0000 mL | Freq: Once | INTRAMUSCULAR | Status: AC | PRN
  Administered 2012-12-23: 80 mL via INTRAVENOUS

## 2012-12-30 ENCOUNTER — Telehealth: Payer: Self-pay | Admitting: Oncology

## 2012-12-30 ENCOUNTER — Ambulatory Visit (HOSPITAL_BASED_OUTPATIENT_CLINIC_OR_DEPARTMENT_OTHER): Payer: Medicare Other | Admitting: Oncology

## 2012-12-30 VITALS — BP 144/81 | HR 92 | Temp 98.5°F | Resp 20 | Ht 62.0 in | Wt 104.2 lb

## 2012-12-30 DIAGNOSIS — M810 Age-related osteoporosis without current pathological fracture: Secondary | ICD-10-CM

## 2012-12-30 DIAGNOSIS — C50319 Malignant neoplasm of lower-inner quadrant of unspecified female breast: Secondary | ICD-10-CM

## 2012-12-30 MED ORDER — EXEMESTANE 25 MG PO TABS: 25.0000 mg | ORAL_TABLET | Freq: Every day | ORAL | Status: DC

## 2012-12-30 NOTE — Progress Notes (Signed)
ID: GUYNELL KLEIBER   DOB: August 21, 1946  MR#: [id number]  BMW#:[id number]  PCP: Illene Regulus, MD   HISTORY OF PRESENT ILLNESS: The patient saw Dr. Huel Cote for routine gynecologic followup and she noted a palpable mass in the right breast she said Ms. Garduno up for right mammography and ultrasonography, performed at the breast center 01/29/2012. The breasts were heterogeneously dense. It mass with dense calcification was noted centrally in the right breast consistent with a benign fibroadenoma. The new palpable mass was not well demonstrated by mammography. It was firm, mobile, and measured approximately 1 cm by palpation. Ultrasound showed an irregularly marginated hypoechoic mass in this area, measuring 1.1 cm.  Biopsy was performed the same day, and showed [phone]) an invasive lobular breast cancer, e-cadherin negative,grade 1, estrogen and progesterone receptor positive, both of 100%, with an MIB-1 of 14%, and no HER-2 amplification.  The patient underwent a left diagnostic mammography 01/30/2012 and bilateral breast MRI 0415/2013.there were no other areas of concern in either breast, and there was no adenopathy noted. By MRI of the right breast mass measured 1.4 cm. Her subsequent history is as detailed below.  INTERVAL HISTORY: [name] returns today with a friend for followup of her right breast cancer. Since her last visit here she underwent right breast biopsy x2 for suspicious areas, both of which proved benign. She had a brain MRI to evaluate headaches and other symptoms, and that came back unremarkable. She also had a restaging chest CT scan, discussed below. She went off her letrozole 2 months ago.  REVIEW OF SYSTEMS: She has noted a significant improvement in symptoms since going off the letrozole. In particular she feels less weak and tired, less anxious, lives sleepiness, fewer "flu symptoms" and leg cramps. This is persuasive evidence that the letrozole was causing at  least some of the symptoms. Aside from that she continues to have mild sinus issues, a history of palpitations, a dry cough, poor appetite, urinary incontinence, problems with psoriasis and easy bruising, occasional headaches and forgetfulness, and anxiety. A detailed review of systems was otherwise noncontributory.   PAST MEDICAL HISTORY: Past Medical History  Diagnosis Date  . Abdominal aortic ectasia 01/31/2010    Qualifier: Diagnosis of  By: Debby Bud MD, Rosalyn Gess ABDOMINAL PAIN RIGHT UPPER QUADRANT 11/07/2010    Qualifier: Diagnosis of  By: Jarold Motto MD Lang Snow CEREBROVASCULAR DISEASE 06/16/2010    Qualifier: Diagnosis of  By: Jonny Ruiz MD, Len Blalock   . CONSTIPATION 01/12/2009    Qualifier: Diagnosis of  By: Koleen Distance CMA (AAMA), Hulan Saas    . Cough 08/15/2010    Qualifier: Diagnosis of  By: Jarold Motto MD Lang Snow DETRUSOR, OVERACTIVE 03/02/2010    Qualifier: Diagnosis of  By: Debby Bud MD, Rosalyn Gess ESOPHAGEAL STRICTURE 01/12/2009    Qualifier: Diagnosis of  By: Koleen Distance CMA (AAMA), Hulan Saas    . EXTERNAL HEMORRHOIDS 01/12/2009    Qualifier: Diagnosis of  By: Koleen Distance CMA (AAMA), Hulan Saas    . GERD 05/10/2007    Qualifier: Diagnosis of  By: Charlsie Quest RMA, Lucy    . HYPERLIPIDEMIA 05/10/2007    Qualifier: Diagnosis of  By: Tyrone Apple, Lucy    . HYPERTENSION 05/10/2007    Qualifier: Diagnosis of  By: Tyrone Apple, Lucy    . HYPOTENSION, ORTHOSTATIC 06/16/2010    Qualifier: Diagnosis of  By: Jonny Ruiz MD, Len Blalock   . Irritable bowel syndrome 01/12/2009    Qualifier: Diagnosis  of  By: Koleen Distance CMA (AAMA), Hulan Saas    . MELANOSIS COLI 02/16/2009    Qualifier: Diagnosis of  By: Koleen Distance CMA (AAMA), Hulan Saas    . PERIPHERAL VASCULAR DISEASE 06/17/2010    Qualifier: Diagnosis of  By: Jonny Ruiz MD, Len Blalock   . RECTAL FISSURE 02/16/2009    Qualifier: Diagnosis of  By: Koleen Distance CMA (AAMA), Hulan Saas    . STENOSIS, RECTAL 02/16/2009    Qualifier: Diagnosis of  By: Koleen Distance CMA (AAMA), Hulan Saas    . SYNCOPE 04/19/2008    Qualifier:  Diagnosis of  By: Debby Bud MD, Rosalyn Gess   . TACHYARRHYTHMIA 09/03/2009    Qualifier: Diagnosis of  By: Debby Bud MD, Rosalyn Gess TRANSIENT ISCHEMIC ATTACK 03/02/2010    Qualifier: Diagnosis of  By: Debby Bud MD, Rosalyn Gess VENOUS INSUFFICIENCY, LEGS 07/18/2010    Qualifier: Diagnosis of  By: Debby Bud MD, Rosalyn Gess   . Eosinophilic esophagitis   . Dysphagia   . Orthostatic hypotension   . Overactive detrusor   . Dizziness and giddiness   . Sinusitis acute   . Personal history of other diseases of digestive system   . Anal or rectal pain   . Other malaise and fatigue   . Adjustment disorder with depressed mood   . Routine general medical examination at a health care facility   . Breast cancer 03/11/12    right breast lumpectomy=invasive lobular ca,grade I/III,ER?PR=positive  . Radiation 04/21/2012-06/11/2012    64 gray right breast    PAST SURGICAL HISTORY: Past Surgical History  Procedure Laterality Date  . Appendectomy    . Abdominal hysterectomy    . Hemmorhoidectomy    . Cardiac catherization    . Esophageal tumor excised; posterior 1986    . Electrocardiogram  10/30/2006  . Edg  04/21/2007  . Breast surgery      left breast x 2  . Cardiac catheterization      FAMILY HISTORY Family History  Problem Relation Age of Onset  . Bone cancer Father   . Cancer Father     Bone  . Heart disease Brother   . Colon cancer Neg Hx   . Breast cancer Neg Hx   . Diabetes Neg Hx   . Anesthesia problems Neg Hx   The patient's father died from "bone cancer" at the age of 23. The patient's mother died in an automobile accident at the age of 28. The patient had one brother who died from a heart attack at the age of 21. The patient had no sisters.  GYNECOLOGIC HISTORY: Menarche age 26, she is GX P1 with first live birth at age 35. She underwent to the change of life approximately 1978. She did not take hormone replacement.  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  Her husband died in a plane crash approximately 5 years ago. Her son, Deshonna Trnka, 37, works in [employer] as an Personnel officer. The patient has no grandchildren. She lives alone with her cat Nicholaus Bloom.  ADVANCED DIRECTIVES: in place per Dr Debby Bud' note  HEALTH MAINTENANCE: History  Substance Use Topics  . Smoking status: Former Smoker    Quit date: 10/22/1965  . Smokeless tobacco: Never Used  . Alcohol Use: 1.8 oz/week    3 Glasses of wine per week     Comment: occasional      Colonoscopy: 2010  PAP: 2011  Bone density: 2011/ osteopenia  Lipid panel: UTD per Dr Debby Bud  No  Known Allergies  Current Outpatient Prescriptions  Medication Sig Dispense Refill  . amLODipine (NORVASC) 5 MG tablet TAKE ONE TABLET BY MOUTH ONE TIME DAILY  30 tablet  5  . aspirin 325 MG EC tablet Take 325 mg by mouth daily.      Marland Kitchen letrozole (FEMARA) 2.5 MG tablet Take 1 tablet (2.5 mg total) by mouth daily.  90 tablet  12  . polyethylene glycol (MIRALAX / GLYCOLAX) packet Take 17 g by mouth 3 (three) times a week.      . Senna-Psyllium (PERDIEM PO) Take 2 tablets by mouth at bedtime.       No current facility-administered medications for this visit.    OBJECTIVE: elderly white woman in no acute distress Filed Vitals:   12/30/12 1534  BP: 144/81  Pulse: 92  Temp: 98.5 F (36.9 C)  Resp: 20     Body mass index is 19.05 kg/(m^2).    ECOG FS:1 Filed Weights   12/30/12 1534  Weight: 104 lb 3.2 oz (47.265 kg)   Sclerae unicteric Oropharynx clear No cervical or supraclavicular adenopathy Lungs no rales or rhonchi, fair excursion bilaterally Heart regular rhythm.. No murmur appreciated Abd soft, nondistended, nontender, positive bowel sounds MSK no focal spinal tenderness, no peripheral edema Neuro: nonfocal, well oriented, positive affect. Breasts: The right breast is status post lumpectomy. Firmness palpated at the 12:00 position is unchanged. Left breast is unremarkable. Axillae benign  bilaterally  LAB RESULTS: Lab Results  Component Value Date   WBC 7.0 11/13/2012   NEUTROABS 5.6 11/13/2012   HGB 12.2 11/13/2012   HCT 34.8 11/13/2012   MCV 93.0 11/13/2012   PLT 225 11/13/2012      Chemistry      Component Value Date/Time   NA 143 11/13/2012 1052   NA 138 06/19/2012 0450   K 3.6 11/13/2012 1052   K 3.6 06/19/2012 0450   CL 104 11/13/2012 1052   CL 100 06/19/2012 0450   CO2 29 11/13/2012 1052   CO2 28 06/19/2012 0450   BUN 15.0 11/13/2012 1052   BUN 9 06/19/2012 0450   CREATININE 0.8 11/13/2012 1052   CREATININE 0.57 06/19/2012 0450      Component Value Date/Time   CALCIUM 9.8 11/13/2012 1052   CALCIUM 10.7* 06/19/2012 0450   ALKPHOS 101 11/13/2012 1052   ALKPHOS 232* 06/19/2012 0450   AST 20 11/13/2012 1052   AST 834* 06/19/2012 0450   ALT 16 11/13/2012 1052   ALT 443* 06/19/2012 0450   BILITOT 0.88 11/13/2012 1052   BILITOT 1.4* 06/19/2012 0450      Lab Results  Component Value Date   LABCA2 10 02/06/2012    STUDIES: Ct Chest W Contrast  12/23/2012  *RADIOLOGY REPORT*  Clinical Data: History of breast cancer.  Follow up pulmonary nodule.  CT CHEST WITH CONTRAST  Technique:  Multidetector CT imaging of the chest was performed following the standard protocol during bolus administration of intravenous contrast.  Contrast: 80mL OMNIPAQUE IOHEXOL 300 MG/ML  SOLN  Comparison: 07/02/2012  Findings: Lungs/pleura: No pleural effusion identified.  Scarring identified within the right middle lobe.  Biapical scar like densities appears similar to previous exam.  Scattered small pulmonary nodules are again noted within the lungs. Index right upper lobe nodule measures 4 mm, image number 14.  This is unchanged from previous exam.  Subpleural right upper lobe nodule measures 4 mm, image number 19.  This is also unchanged from previous exam.  New nodule within the right middle  measures 3 mm, image 31/series 5.  Within the right lower lobe there is a new nodule measuring 3 mm, image 36.   Heart/Mediastinum: The heart size is normal.  There is no pericardial effusion.  No enlarged mediastinal or hilar lymph nodes.  Upper abdomen: Limited imaging through the upper abdomen shows multiple low attenuation structures within the kidneys.  Bones/Musculoskeletal:  There is no enlarged axillary or supraclavicular adenopathy.  No aggressive lytic or sclerotic bone lesions identified.  Postsurgical changes within the right breast are again noted and appear stable from previous exam.  IMPRESSION: 1.  No acute findings and no specific features to suggest metastatic disease. 2.  Again noted are small pulmonary nodules.  The index lesions from the previous exam are stable.  There are several new nodules within the right lung.  The largest of these measures 3 mm. Based on the size of these nodules follow-up imaging in 6-12 months is advised to confirm stability of these nodules.   Original Report Authenticated By: Signa Kell, M.D.    ASSESSMENT: 67 year old [place] woman status post right lumpectomy 03/11/2012 for a pT1c N0, stage IA, invasive lobular breast cancer, grade 1, with a broadly positive margin which according to surgery could not be cleared short of mastectomy. The tumor is strongly estrogen and progesterone receptor positive, with a low Mib-1 and no evidence of HER-2 amplification. The Oncotype recurrence score is 23, predicting a 15% chance of distant recurrence with adjuvant tamoxifen. The Adjuvant! Program predicts a risk of recurrence of 17%, dropping to 9% with aromatase inhibitors.  (1) completed adjuvant irradiation 06/11/2012  (2) started anastrozole 06/11/2012 and held since 06/19/2012 due to acute abdominal pain and elevated LFTs.    (3)  Started letrozole in October 2013, discontinued January 2014 with poor tolerance  (4) osteoporosis: yearly zolendronic acid planed to start 01/20/2013  (5) exemestane to start mid-April 2014  (6) questionable lung lesions requiring further  follow-up  PLAN:  We went over her biopsy and brain MRI results, both of which were favorable. We then discussed her osteoporosis situation. She is a ready taking vitamin D and calcium. She needs to walk more, but even with that I think she would benefit from bisphosphonates. We talked about the possible toxicities, side effects and complications of these medications, including the danger, the remote, of osteonecrosis of the jaw. We decided her best that would be to try zoledronic acid yearly, and she will receive her first dose April 1.  We then talked about antiestrogen therapy. We want to avoid tamoxifen because of her history of TIAs. She has a ready proven that the nonsteroidals aromatase inhibitors will not work for her. Accordingly we will go with exemestane. I have asked her to start sometime in mid April, after she recovers from any symptoms she may have from the zoledronic acid infusion.  She will see [place] again in 3 months just to make sure she is tolerating the exemestane well. She will see me again in 6 months, and before that visit we will repeat a chest CT. I have explained to her that the "new" spots in her lung may be due to registration changes, but nevertheless this needs further evaluation. She is agreeable to repeating this study.  She knows to call for any problems that may develop before the next visit.  MAGRINAT,GUSTAV C    12/30/2012

## 2012-12-30 NOTE — Telephone Encounter (Signed)
gv pt appt schedule for June and September. Pt aware central will call re ct appt.

## 2012-12-31 ENCOUNTER — Other Ambulatory Visit: Payer: Self-pay | Admitting: Oncology

## 2012-12-31 DIAGNOSIS — C50919 Malignant neoplasm of unspecified site of unspecified female breast: Secondary | ICD-10-CM

## 2013-01-02 ENCOUNTER — Telehealth: Payer: Self-pay | Admitting: Oncology

## 2013-01-02 NOTE — Telephone Encounter (Signed)
Per email from [facility] pof sent for zometa. S/w pt re appt for lb/zometa 4/1. Pt already has other appts.

## 2013-01-05 ENCOUNTER — Other Ambulatory Visit: Payer: Medicare Other | Admitting: Lab

## 2013-01-05 ENCOUNTER — Other Ambulatory Visit (HOSPITAL_COMMUNITY): Payer: Medicare Other

## 2013-01-07 ENCOUNTER — Encounter: Payer: Self-pay | Admitting: Internal Medicine

## 2013-01-07 ENCOUNTER — Ambulatory Visit (INDEPENDENT_AMBULATORY_CARE_PROVIDER_SITE_OTHER): Payer: Medicare Other | Admitting: Internal Medicine

## 2013-01-07 VITALS — BP 152/90 | HR 85 | Temp 97.7°F | Resp 16 | Ht 62.0 in | Wt 103.0 lb

## 2013-01-07 DIAGNOSIS — I10 Essential (primary) hypertension: Secondary | ICD-10-CM

## 2013-01-07 DIAGNOSIS — Z0000 Encounter for general adult medical examination without abnormal findings: Secondary | ICD-10-CM

## 2013-01-07 DIAGNOSIS — I679 Cerebrovascular disease, unspecified: Secondary | ICD-10-CM

## 2013-01-07 DIAGNOSIS — C50311 Malignant neoplasm of lower-inner quadrant of right female breast: Secondary | ICD-10-CM

## 2013-01-07 DIAGNOSIS — K589 Irritable bowel syndrome without diarrhea: Secondary | ICD-10-CM

## 2013-01-07 DIAGNOSIS — E785 Hyperlipidemia, unspecified: Secondary | ICD-10-CM | POA: Diagnosis not present

## 2013-01-07 DIAGNOSIS — I872 Venous insufficiency (chronic) (peripheral): Secondary | ICD-10-CM

## 2013-01-07 NOTE — Progress Notes (Signed)
Subjective:    Patient ID: Crystal [name], female    DOB: 05-Mar-1946, 67 y.o.   MRN: [id number]  HPI Ms Spurling presents for general medical follow up. In the interval since her last visit she has been diagnosed with breast cancer and has had lumpectomy followed by XRT. She is followed by oncology and is now about to start long term hormonal therapy with Aromasin having been intolerant of Femara and not a candidate for tamoxofin.    The risk factors are reflected in the social history.  The roster of all physicians providing medical care to patient - is listed in the Snapshot section of the chart.  Activities of daily living:  The patient is 100% inedpendent in all ADLs: dressing, toileting, feeding as well as independent mobility  Home safety : The patient has smoke detectors in the home. Falls - none. Home is fall safe.  They wear seatbelts. No firearms at home  There is no risks for hepatitis, STDs or HIV. There is no   history of blood transfusion. They have no travel history to infectious disease endemic areas of the world.  The patient has seen their dentist in the last six month. They have seen their eye doctor in the last year. They deny any hearing difficulty and have not had audiologic testing in the last year.    They do not  have excessive sun exposure. Discussed the need for sun protection: hats, long sleeves and use of sunscreen if there is significant sun exposure.   Diet: the importance of a healthy diet is discussed. They do have a healthy diet.  The patient has no regular exercise program.  The benefits of regular aerobic exercise were discussed.  Depression screen: there are no signs or vegative symptoms of depression- irritability, change in appetite, anhedonia, sadness/tearfullness.  Cognitive assessment: the patient manages all their financial and personal affairs and is actively engaged.   The following portions of the patient's history were reviewed and updated  as appropriate: allergies, current medications, past family history, past medical history,  past surgical history, past social history  and problem list.  Past Medical History  Diagnosis Date  . Abdominal aortic ectasia 01/31/2010    Qualifier: Diagnosis of  By: Debby Bud MD, Rosalyn Gess ABDOMINAL PAIN RIGHT UPPER QUADRANT 11/07/2010    Qualifier: Diagnosis of  By: Jarold Motto MD Lang Snow CEREBROVASCULAR DISEASE 06/16/2010    Qualifier: Diagnosis of  By: Jonny Ruiz MD, Len Blalock   . CONSTIPATION 01/12/2009    Qualifier: Diagnosis of  By: Koleen Distance CMA (AAMA), Hulan Saas    . Cough 08/15/2010    Qualifier: Diagnosis of  By: Jarold Motto MD Lang Snow DETRUSOR, OVERACTIVE 03/02/2010    Qualifier: Diagnosis of  By: Debby Bud MD, Rosalyn Gess ESOPHAGEAL STRICTURE 01/12/2009    Qualifier: Diagnosis of  By: Koleen Distance CMA (AAMA), Hulan Saas    . EXTERNAL HEMORRHOIDS 01/12/2009    Qualifier: Diagnosis of  By: Koleen Distance CMA (AAMA), Hulan Saas    . GERD 05/10/2007    Qualifier: Diagnosis of  By: Charlsie Quest RMA, Lucy    . HYPERLIPIDEMIA 05/10/2007    Qualifier: Diagnosis of  By: Tyrone Apple, Lucy    . HYPERTENSION 05/10/2007    Qualifier: Diagnosis of  By: Tyrone Apple, Lucy    . HYPOTENSION, ORTHOSTATIC 06/16/2010    Qualifier: Diagnosis of  By: Jonny Ruiz MD, Len Blalock   . Irritable bowel syndrome 01/12/2009  Qualifier: Diagnosis of  By: Koleen Distance CMA (AAMA), Hulan Saas    . MELANOSIS COLI 02/16/2009    Qualifier: Diagnosis of  By: Koleen Distance CMA (AAMA), Hulan Saas    . PERIPHERAL VASCULAR DISEASE 06/17/2010    Qualifier: Diagnosis of  By: Jonny Ruiz MD, Len Blalock   . RECTAL FISSURE 02/16/2009    Qualifier: Diagnosis of  By: Koleen Distance CMA (AAMA), Hulan Saas    . STENOSIS, RECTAL 02/16/2009    Qualifier: Diagnosis of  By: Koleen Distance CMA (AAMA), Hulan Saas    . SYNCOPE 04/19/2008    Qualifier: Diagnosis of  By: Debby Bud MD, Rosalyn Gess   . TACHYARRHYTHMIA 09/03/2009    Qualifier: Diagnosis of  By: Debby Bud MD, Rosalyn Gess TRANSIENT ISCHEMIC ATTACK 03/02/2010    Qualifier:  Diagnosis of  By: Debby Bud MD, Rosalyn Gess VENOUS INSUFFICIENCY, LEGS 07/18/2010    Qualifier: Diagnosis of  By: Debby Bud MD, Rosalyn Gess   . Eosinophilic esophagitis   . Dysphagia   . Orthostatic hypotension   . Overactive detrusor   . Dizziness and giddiness   . Sinusitis acute   . Personal history of other diseases of digestive system   . Anal or rectal pain   . Other malaise and fatigue   . Adjustment disorder with depressed mood   . Routine general medical examination at a health care facility   . Breast cancer 03/11/12    right breast lumpectomy=invasive lobular ca,grade I/III,ER?PR=positive  . Radiation 04/21/2012-06/11/2012    64 gray right breast   Past Surgical History  Procedure Laterality Date  . Appendectomy    . Abdominal hysterectomy    . Hemmorhoidectomy    . Cardiac catherization    . Esophageal tumor excised; posterior 1986    . Electrocardiogram  10/30/2006  . Edg  04/21/2007  . Breast surgery      left breast x 2  . Cardiac catheterization     Family History  Problem Relation Age of Onset  . Bone cancer Father   . Cancer Father     Bone  . Heart disease Brother   . Colon cancer Neg Hx   . Breast cancer Neg Hx   . Diabetes Neg Hx   . Anesthesia problems Neg Hx    History   Social History  . Marital Status: Widowed    Spouse Name: N/A    Number of Children: N/A  . Years of Education: N/A   Occupational History  . Not on file.   Social History Main Topics  . Smoking status: Former Smoker    Quit date: 10/22/1965  . Smokeless tobacco: Never Used  . Alcohol Use: 1.8 oz/week    3 Glasses of wine per week     Comment: occasional   . Drug Use: No  . Sexually Active:    Other Topics Concern  . Not on file   Social History Narrative   HSG   Married '71-  Widowed '08   1 son - '76; no grandchildren   Retired, Lives alone. I-ADL's   End of Life: No resuscitation, no mechanical ventilation, no futile heroic measures.   Patient is a former smoker,  quit 40 years ago.   Alcohol use-yes occ wine   Daily caffeine use 2 cups coffee / day   Illicit drug use-no   Patient does not get regular exercise.    Current Outpatient Prescriptions on File Prior to Visit  Medication Sig Dispense Refill  . amLODipine (NORVASC) 5 MG tablet  TAKE ONE TABLET BY MOUTH ONE TIME DAILY  30 tablet  5  . aspirin 325 MG EC tablet Take 325 mg by mouth daily.      Marland Kitchen exemestane (AROMASIN) 25 MG tablet Take 1 tablet (25 mg total) by mouth daily after breakfast.  90 tablet  4  . polyethylene glycol (MIRALAX / GLYCOLAX) packet Take 17 g by mouth 3 (three) times a week.      . Senna-Psyllium (PERDIEM PO) Take 2 tablets by mouth at bedtime.       No current facility-administered medications on file prior to visit.     Vision, hearing, body mass index were assessed and reviewed.   During the course of the visit the patient was educated and counseled about appropriate screening and preventive services including : fall prevention , diabetes screening, nutrition counseling, colorectal cancer screening, and recommended immunizations.    Review of Systems Constitutional:  Negative for fever, chills, activity change and unexpected weight change.  HEENT:  Negative for hearing loss, ear pain, congestion, neck stiffness and postnasal drip. Negative for sore throat or swallowing problems. Negative for dental complaints.   Eyes: Negative for vision loss or change in visual acuity.  Respiratory: Negative for chest tightness and wheezing. Negative for DOE.   Cardiovascular: Negative for chest pain or palpitations. No decreased exercise tolerance Gastrointestinal: No change in bowel habit. No bloating or gas. No reflux or indigestion Genitourinary: Negative for urgency, frequency, flank pain and difficulty urinating.  Musculoskeletal: Negative for myalgias, back pain, arthralgias and gait problem.  Neurological: Negative for dizziness, tremors, weakness and headaches.   Hematological: Negative for adenopathy.  Psychiatric/Behavioral: Negative for behavioral problems and dysphoric mood.       Objective:   Physical Exam Filed Vitals:   01/07/13 0950  BP: 152/90  Pulse: 85  Temp: 97.7 F (36.5 C)  Resp: 16   Wt Readings from Last 3 Encounters:  01/07/13 103 lb (46.72 kg)  12/30/12 104 lb 3.2 oz (47.265 kg)  12/10/12 104 lb 6.4 oz (47.356 kg)   Gen'l: well nourished, well developed but slender white Woman in no distress HEENT - [place]/AT, EACs/TMs normal, oropharynx with native dentition in good condition, no buccal or palatal lesions, posterior pharynx clear, mucous membranes moist. C&S clear, PERRLA, fundi - normal Neck - supple, no thyromegaly Nodes- negative submental, cervical, supraclavicular regions Chest - no deformity, no CVAT Lungs - clear without rales, wheezes. No increased work of breathing Breast - deferred Cardiovascular - regular rate and rhythm, quiet precordium, no murmurs, rubs or gallops, 2+ radial, DP and PT pulses Abdomen - BS+ x 4, no HSM, no guarding or rebound or tenderness Pelvic - deferred to gyn Rectal - deferred to gyn Extremities - no clubbing, cyanosis, edema or deformity.  Neuro - A&O x 3, CN II-XII normal, motor strength normal and equal, DTRs 2+ and symmetrical biceps, radial, and patellar tendons. Cerebellar - no tremor, no rigidity, fluid movement and normal gait. Derm - Head, neck, back, abdomen and extremities without suspicious lesions        Assessment & Plan:

## 2013-01-07 NOTE — Patient Instructions (Addendum)
Thanks for coming in.  You seem to be doing GREAT. You are current in all your labs, immunizations and colorectal cancer screening.  Your cholesterol was checked in Sept '13 and the LDL was 146. Your risk of having a cardiac event in the next 10 years is 6%, a low risk. It is fine to elect to not treat your cholesterol medically but to continue a healthy life-style, including exercise.  Please sign up for My Chart.

## 2013-01-08 NOTE — Assessment & Plan Note (Signed)
Last LDL 6 months ago was 147. Reviewed NCEP ATP III recommendations with: goal of LDL 130 or less, treatment threshold of 160 or higher. Her Framingham cardiac risk calculation gives a 10 year risk of a cardiac event of 6% = low risk.  Plan Life-style management with a careful low fat diet and regular exercise.  Defers medical therapy in light of ATP III recommendations and low 10 year risk.

## 2013-01-08 NOTE — Assessment & Plan Note (Signed)
Doing very well. Following with Dr. Darnelle Catalan and her surgeon Dr. Biagio Quint. She will be start hormonal therapy in April '14

## 2013-01-08 NOTE — Assessment & Plan Note (Signed)
Normal exam at today's visit: w/o peripheral edema

## 2013-01-08 NOTE — Assessment & Plan Note (Signed)
Stable with no events. Normal MRI brain in Jan '14.  Plan Risk factor management

## 2013-01-08 NOTE — Assessment & Plan Note (Signed)
Interval history significant for breast cancer with subsequent surgery and radiation therapy. She has done very well. Limited physical exam is normal (breast exam and pelvic deferred). Reviewed laboratory results - stable and in normal range with no need for repeat labs at this visit. She is current with colorectal cancer screening. Immunizations are current.  In summary - a very nice woman who looks great and a lot better than her problem list! She will continue her currents medications. She is encouraged to resume an exercise program that includes aerobic work, e.g. Walking and stretch/flex, e.g. Yoga, etc. She will return as needed or in 1 year.

## 2013-01-08 NOTE — Assessment & Plan Note (Signed)
No report of active problem, no report of irregular bowel habit.  Plan Continue with present regimen: glycolax, senna-psyllium

## 2013-01-08 NOTE — Assessment & Plan Note (Signed)
BP Readings from Last 3 Encounters:  01/07/13 152/90  12/30/12 144/81  12/10/12 127/86   Upward trend in readings.   Plan Continue present medication  Monitor BP at home and report back via MyChart -  If SBP consistently running 140+ will add diuretic to her current regimen

## 2013-01-20 ENCOUNTER — Other Ambulatory Visit (HOSPITAL_BASED_OUTPATIENT_CLINIC_OR_DEPARTMENT_OTHER): Payer: Medicare Other | Admitting: Lab

## 2013-01-20 ENCOUNTER — Ambulatory Visit (HOSPITAL_BASED_OUTPATIENT_CLINIC_OR_DEPARTMENT_OTHER): Payer: Medicare Other

## 2013-01-20 VITALS — BP 137/56 | HR 84 | Temp 98.0°F | Resp 18

## 2013-01-20 DIAGNOSIS — M810 Age-related osteoporosis without current pathological fracture: Secondary | ICD-10-CM | POA: Diagnosis not present

## 2013-01-20 DIAGNOSIS — C50919 Malignant neoplasm of unspecified site of unspecified female breast: Secondary | ICD-10-CM

## 2013-01-20 DIAGNOSIS — C50319 Malignant neoplasm of lower-inner quadrant of unspecified female breast: Secondary | ICD-10-CM

## 2013-01-20 LAB — BASIC METABOLIC PANEL (CC13)
BUN: 14.8 mg/dL (ref 7.0–26.0)
CO2: 27 mEq/L (ref 22–29)
Chloride: 106 mEq/L (ref 98–107)
Creatinine: 0.8 mg/dL (ref 0.6–1.1)
Glucose: 118 mg/dl — ABNORMAL HIGH (ref 70–99)

## 2013-01-20 MED ORDER — SODIUM CHLORIDE 0.9 % IV SOLN
INTRAVENOUS | Status: DC
Start: 2013-01-20 — End: 2013-01-20
  Administered 2013-01-20: 16:00:00 via INTRAVENOUS

## 2013-01-20 MED ORDER — ZOLEDRONIC ACID 4 MG/100ML IV SOLN
4.0000 mg | Freq: Once | INTRAVENOUS | Status: AC
  Administered 2013-01-20: 4 mg via INTRAVENOUS
  Filled 2013-01-20: qty 100

## 2013-01-20 NOTE — Patient Instructions (Signed)
Patient aware of next appointment; discharged home with no complaints. 

## 2013-01-26 DIAGNOSIS — S61409A Unspecified open wound of unspecified hand, initial encounter: Secondary | ICD-10-CM | POA: Diagnosis not present

## 2013-02-03 DIAGNOSIS — H04129 Dry eye syndrome of unspecified lacrimal gland: Secondary | ICD-10-CM | POA: Diagnosis not present

## 2013-02-20 IMAGING — CT CT CHEST W/ CM
2 of 3 series · 15 of 36 positions shown, 18 images · IV contrast (Omnipaque 300)
Comparison: CT abdomen pelvis 06/19/2012.

CLINICAL DATA: Followup right lower lobe nodule.  Recent breast
cancer diagnosis.

CT CHEST WITH CONTRAST
TECHNIQUE: Multidetector CT imaging of the chest was performed
following the standard protocol during bolus administration of
intravenous contrast.
Contrast: 80mL OMNIPAQUE IOHEXOL 300 MG/ML  SOLN

[Series 2: chest routine with · axial · 0.69mm/px · z∈[-274,-24]mm · 12 of 60 slices shown, 15 images]
[im 5/60  mediastinal]
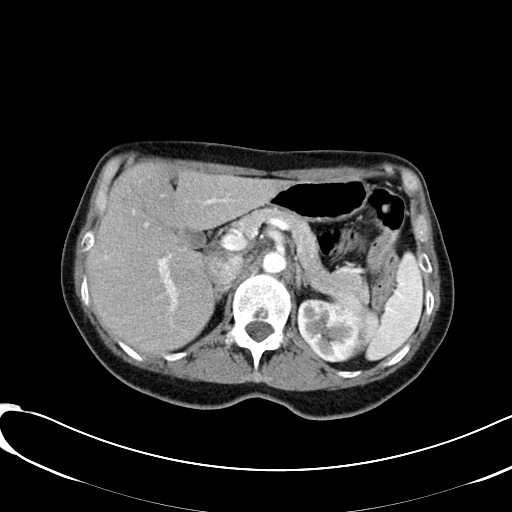
[im 5/60  lung]
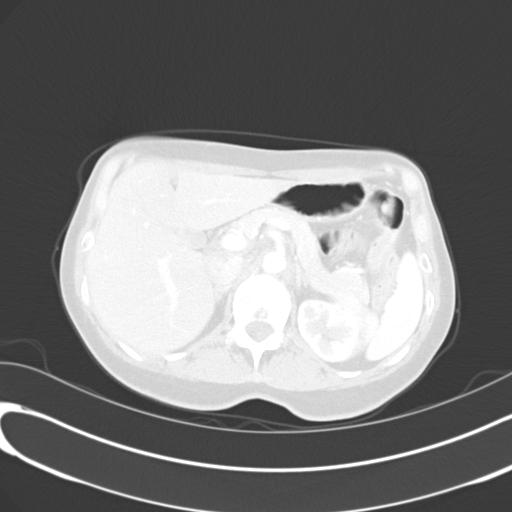
[im 9/60  lung]
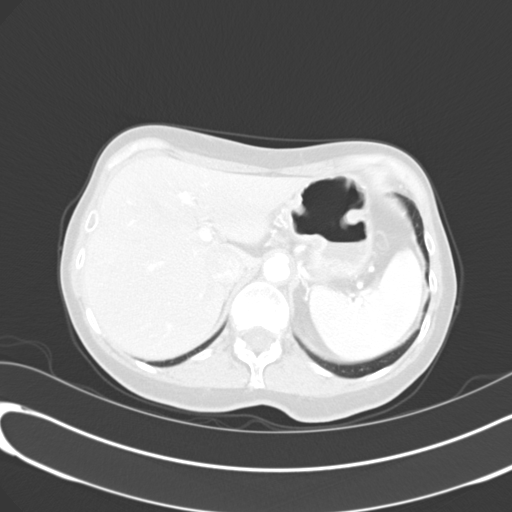
[im 14/60  lung]
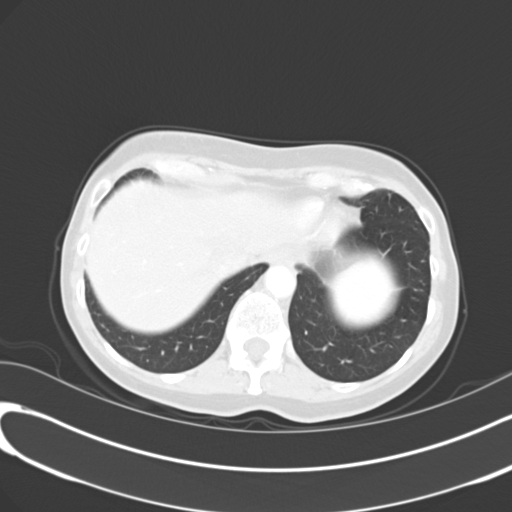
[im 18/60  lung]
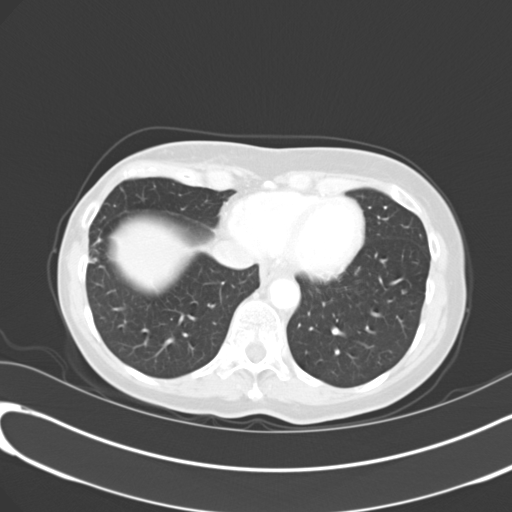
[im 22/60  mediastinal]
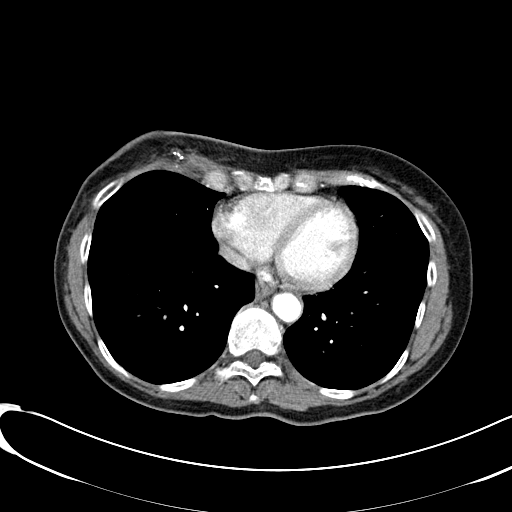
[im 22/60  lung]
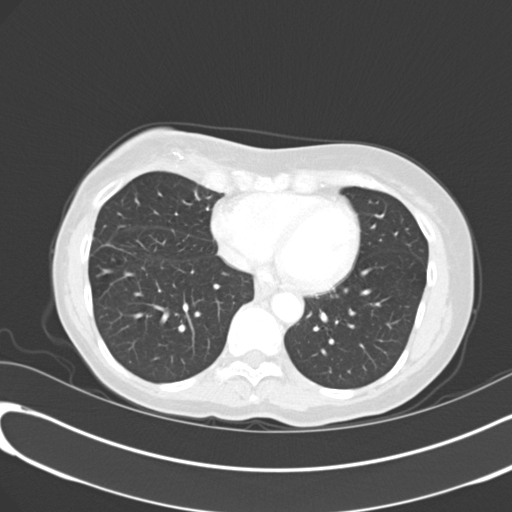
[im 27/60  lung]
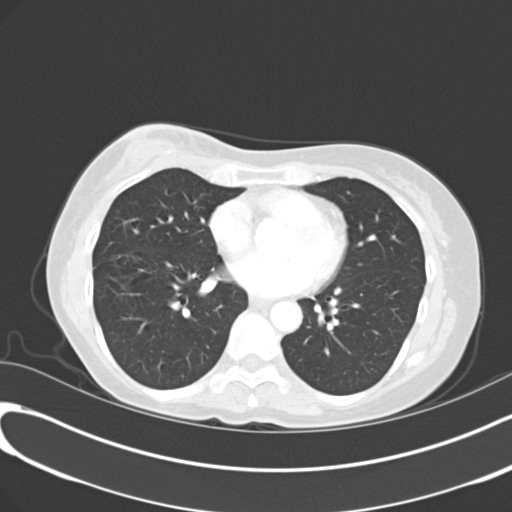
[im 33/60  lung]
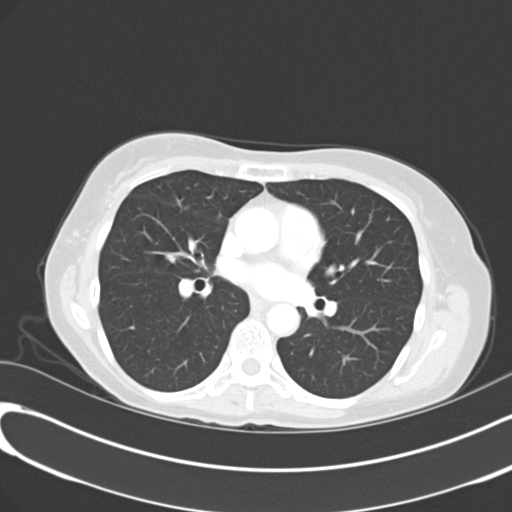
[im 38/60  lung]
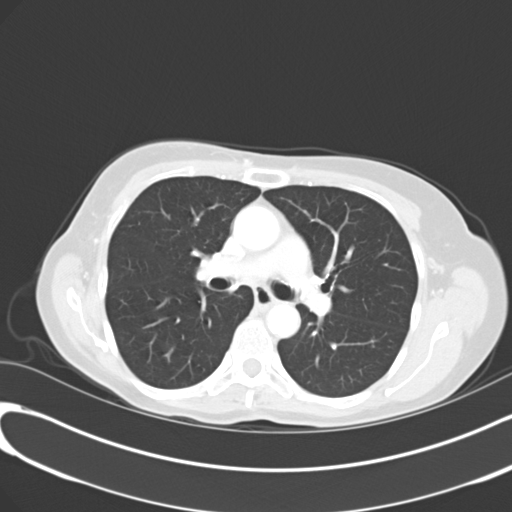
[im 42/60  mediastinal]
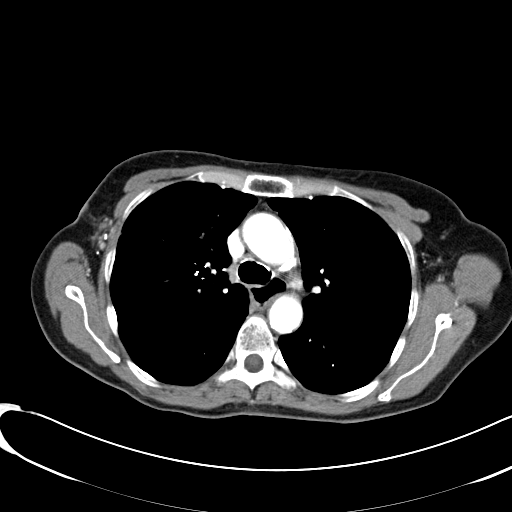
[im 42/60  lung]
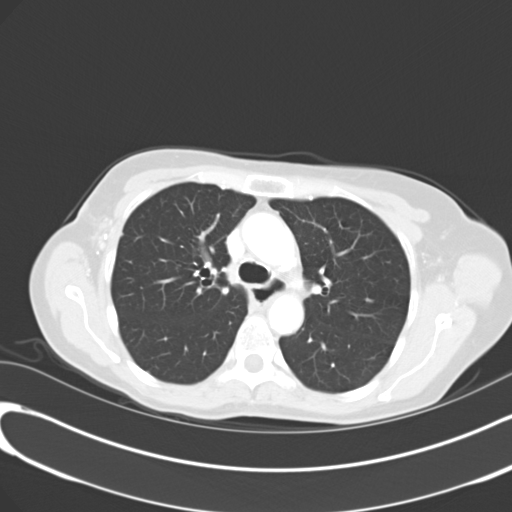
[im 46/60  lung]
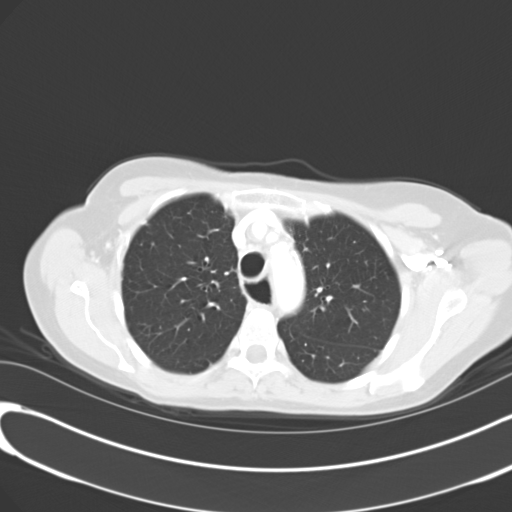
[im 51/60  lung]
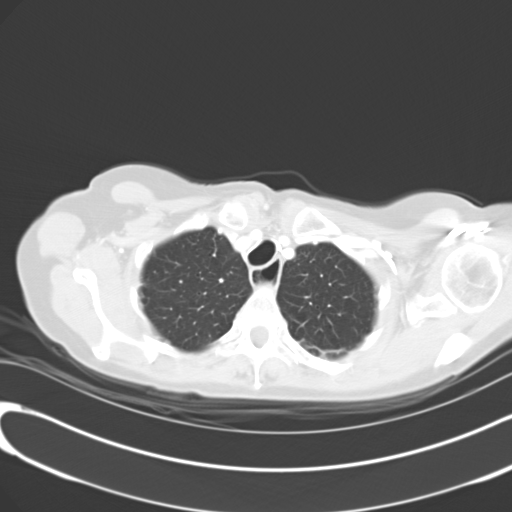
[im 55/60  lung]
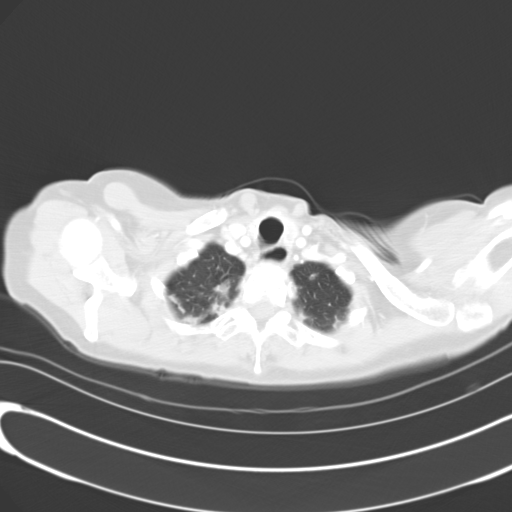

[Series 602: cor · coronal · 0.69mm/px · 3 of 81 slices shown]
[im 17/81  lung]
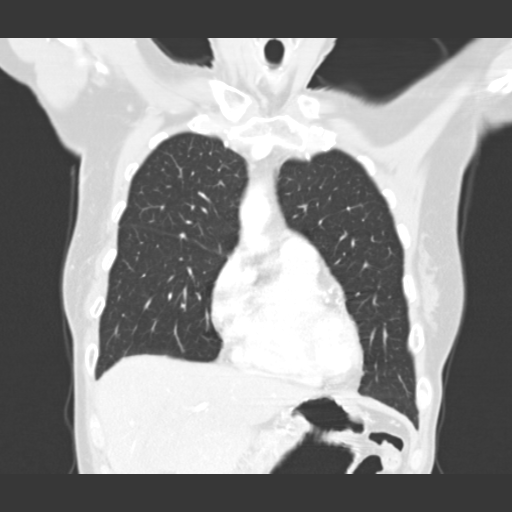
[im 33/81  lung]
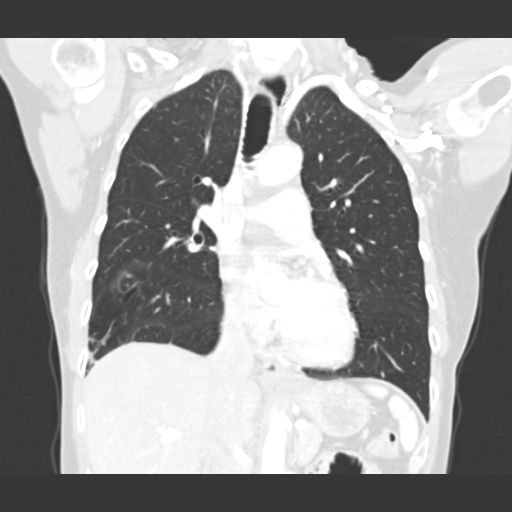
[im 49/81  lung]
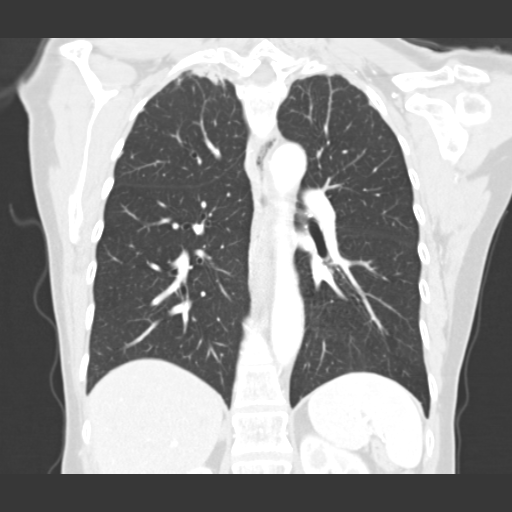

[15 of 36 positions shown; findings below may reference images not displayed]

FINDINGS: Sub-centimeter thyroid nodule(s) noted, too small to
characterize, but most likely benign in the absence of known
clinical risk factors for thyroid carcinoma.  No pathologically
enlarged mediastinal, hilar or axillary lymph nodes.  Esophagus is
dilated proximally.  Coronary artery calcification.  Heart size
normal.  No pericardial effusion. Postoperative changes are seen in
the right breast.

Biapical pleural parenchymal scarring.  There are areas of
bronchiectasis and mucoid impaction in the peripheral right lower
lobe.  Scattered tiny nodular densities are seen in the lungs
bilaterally, most of which are peripheral in location and all of
which are 4 mm or less in size.  No pleural fluid.  Airway is
unremarkable.  No pleural fluid.  Airway is unremarkable.

Incidental imaging of the upper abdomen shows low attenuation
lesions in the left kidney, measuring up to 6 mm.  No worrisome
lytic or sclerotic lesions.
IMPRESSION: 1.  Scattered tiny nodular densities in the lungs bilaterally,
which are mostly peripheral in location. While a benign etiology is
favored, in the setting of breast cancer, a short-term interval
follow-up could be performed to ensure stability, as clinically
indicated.
2.  Focal bronchiectasis and mucus plugging in the right lower
lobe.

## 2013-03-11 DIAGNOSIS — Z0000 Encounter for general adult medical examination without abnormal findings: Secondary | ICD-10-CM | POA: Diagnosis not present

## 2013-03-11 DIAGNOSIS — Z01419 Encounter for gynecological examination (general) (routine) without abnormal findings: Secondary | ICD-10-CM | POA: Diagnosis not present

## 2013-03-25 ENCOUNTER — Other Ambulatory Visit (HOSPITAL_BASED_OUTPATIENT_CLINIC_OR_DEPARTMENT_OTHER): Payer: Medicare Other | Admitting: Lab

## 2013-03-25 DIAGNOSIS — C50319 Malignant neoplasm of lower-inner quadrant of unspecified female breast: Secondary | ICD-10-CM

## 2013-03-25 LAB — CBC WITH DIFFERENTIAL/PLATELET
EOS%: 2.1 % (ref 0.0–7.0)
LYMPH%: 9.8 % — ABNORMAL LOW (ref 14.0–49.7)
MCH: 32.3 pg (ref 25.1–34.0)
MCV: 92.4 fL (ref 79.5–101.0)
MONO%: 6.5 % (ref 0.0–14.0)
RBC: 4.03 10*6/uL (ref 3.70–5.45)
RDW: 12.1 % (ref 11.2–14.5)

## 2013-03-25 LAB — COMPREHENSIVE METABOLIC PANEL (CC13)
AST: 18 U/L (ref 5–34)
Albumin: 3.9 g/dL (ref 3.5–5.0)
Alkaline Phosphatase: 94 U/L (ref 40–150)
BUN: 9.8 mg/dL (ref 7.0–26.0)
Potassium: 4.2 mEq/L (ref 3.5–5.1)
Sodium: 143 mEq/L (ref 136–145)
Total Bilirubin: 1.12 mg/dL (ref 0.20–1.20)

## 2013-03-31 ENCOUNTER — Ambulatory Visit (HOSPITAL_BASED_OUTPATIENT_CLINIC_OR_DEPARTMENT_OTHER): Payer: Medicare Other | Admitting: Physician Assistant

## 2013-03-31 ENCOUNTER — Telehealth: Payer: Self-pay | Admitting: Oncology

## 2013-03-31 ENCOUNTER — Encounter: Payer: Self-pay | Admitting: Physician Assistant

## 2013-03-31 VITALS — BP 138/76 | HR 81 | Temp 97.9°F | Resp 20 | Ht 62.0 in | Wt 104.8 lb

## 2013-03-31 DIAGNOSIS — M2550 Pain in unspecified joint: Secondary | ICD-10-CM

## 2013-03-31 DIAGNOSIS — C50319 Malignant neoplasm of lower-inner quadrant of unspecified female breast: Secondary | ICD-10-CM | POA: Diagnosis not present

## 2013-03-31 DIAGNOSIS — IMO0001 Reserved for inherently not codable concepts without codable children: Secondary | ICD-10-CM

## 2013-03-31 DIAGNOSIS — F419 Anxiety disorder, unspecified: Secondary | ICD-10-CM

## 2013-03-31 DIAGNOSIS — M791 Myalgia, unspecified site: Secondary | ICD-10-CM

## 2013-03-31 DIAGNOSIS — C50919 Malignant neoplasm of unspecified site of unspecified female breast: Secondary | ICD-10-CM

## 2013-03-31 DIAGNOSIS — C50911 Malignant neoplasm of unspecified site of right female breast: Secondary | ICD-10-CM

## 2013-03-31 DIAGNOSIS — C50311 Malignant neoplasm of lower-inner quadrant of right female breast: Secondary | ICD-10-CM

## 2013-03-31 DIAGNOSIS — F411 Generalized anxiety disorder: Secondary | ICD-10-CM

## 2013-03-31 MED ORDER — EXEMESTANE 25 MG PO TABS: 25.0000 mg | ORAL_TABLET | Freq: Every day | ORAL | Status: DC

## 2013-03-31 NOTE — Progress Notes (Signed)
ID: Crystal ZINGARO   DOB: 08/01/1946  MR#: [id number]  WUJ#:[id number]  PCP: Illene Regulus, MD   HISTORY OF PRESENT ILLNESS: The patient saw Dr. Huel Cote for routine gynecologic followup and she noted a palpable mass in the right breast she said Crystal [name] up for right mammography and ultrasonography, performed at the breast center 01/29/2012. The breasts were heterogeneously dense. It mass with dense calcification was noted centrally in the right breast consistent with a benign fibroadenoma. The new palpable mass was not well demonstrated by mammography. It was firm, mobile, and measured approximately 1 cm by palpation. Ultrasound showed an irregularly marginated hypoechoic mass in this area, measuring 1.1 cm.  Biopsy was performed the same day, and showed [phone]) an invasive lobular breast cancer, e-cadherin negative,grade 1, estrogen and progesterone receptor positive, both of 100%, with an MIB-1 of 14%, and no HER-2 amplification.  The patient underwent a left diagnostic mammography 01/30/2012 and bilateral breast MRI 0415/2013.there were no other areas of concern in either breast, and there was no adenopathy noted. By MRI of the right breast mass measured 1.4 cm. Her subsequent history is as detailed below.  INTERVAL HISTORY: Crystal [name] returns today for followup of her right breast cancer. Since her visit here in March of 2014, she received her first dose of zoledronic acid on 01/20/2013, and started on exemestane in mid April. She tolerated the zoledronic acid very well, and specifically denies any jaw pain, flu-type aches, or low-grade fever. The exemestane, however, has been a different story.   Within 30 days of beginning the exemestane she began having basically the same side effects she previously experienced with letrozole. Specifically she had general aches and pains, especially affecting her back, shoulders, and clavicles. Her anxiety has increased. She's having difficulty  sleeping. She feels very weak and tired.   REVIEW OF SYSTEMS: Crystal [name] has had no fevers but does have some occasional chills. It sounds like she is having some hot flashes. She's had no skin changes. She denies any abnormal bleeding. She has some occasional tightness and pain in the right side with movement, and also has some apparent postsurgical burning under the right breast. The breast is somewhat tender to palpation. She's eating and drinking fairly well with no nausea, diarrhea, constipation, or change in urinary habits. She denies any cough, phlegm production, pleurisy, or chest pain. She does have some shortness of breath with exertion. She's had no abnormal headaches or dizziness. She denies any peripheral swelling.  A detailed review of systems is otherwise stable and noncontributory.   PAST MEDICAL HISTORY: Past Medical History  Diagnosis Date  . Abdominal aortic ectasia 01/31/2010    Qualifier: Diagnosis of  By: Debby Bud MD, Rosalyn Gess ABDOMINAL PAIN RIGHT UPPER QUADRANT 11/07/2010    Qualifier: Diagnosis of  By: Jarold Motto MD Lang Snow CEREBROVASCULAR DISEASE 06/16/2010    Qualifier: Diagnosis of  By: Jonny Ruiz MD, Len Blalock   . CONSTIPATION 01/12/2009    Qualifier: Diagnosis of  By: Koleen Distance CMA (AAMA), Hulan Saas    . Cough 08/15/2010    Qualifier: Diagnosis of  By: Jarold Motto MD Lang Snow DETRUSOR, OVERACTIVE 03/02/2010    Qualifier: Diagnosis of  By: Debby Bud MD, Rosalyn Gess ESOPHAGEAL STRICTURE 01/12/2009    Qualifier: Diagnosis of  By: Koleen Distance CMA (AAMA), Hulan Saas    . EXTERNAL HEMORRHOIDS 01/12/2009    Qualifier: Diagnosis of  By: Koleen Distance CMA (AAMA), Hulan Saas    .  GERD 05/10/2007    Qualifier: Diagnosis of  By: Charlsie Quest RMA, Lucy    . HYPERLIPIDEMIA 05/10/2007    Qualifier: Diagnosis of  By: Tyrone Apple, Lucy    . HYPERTENSION 05/10/2007    Qualifier: Diagnosis of  By: Tyrone Apple, Lucy    . HYPOTENSION, ORTHOSTATIC 06/16/2010    Qualifier: Diagnosis of  By: Jonny Ruiz MD, Len Blalock   .  Irritable bowel syndrome 01/12/2009    Qualifier: Diagnosis of  By: Koleen Distance CMA (AAMA), Hulan Saas    . MELANOSIS COLI 02/16/2009    Qualifier: Diagnosis of  By: Koleen Distance CMA (AAMA), Hulan Saas    . PERIPHERAL VASCULAR DISEASE 06/17/2010    Qualifier: Diagnosis of  By: Jonny Ruiz MD, Len Blalock   . RECTAL FISSURE 02/16/2009    Qualifier: Diagnosis of  By: Koleen Distance CMA (AAMA), Hulan Saas    . STENOSIS, RECTAL 02/16/2009    Qualifier: Diagnosis of  By: Koleen Distance CMA (AAMA), Hulan Saas    . SYNCOPE 04/19/2008    Qualifier: Diagnosis of  By: Debby Bud MD, Rosalyn Gess   . TACHYARRHYTHMIA 09/03/2009    Qualifier: Diagnosis of  By: Debby Bud MD, Rosalyn Gess TRANSIENT ISCHEMIC ATTACK 03/02/2010    Qualifier: Diagnosis of  By: Debby Bud MD, Rosalyn Gess VENOUS INSUFFICIENCY, LEGS 07/18/2010    Qualifier: Diagnosis of  By: Debby Bud MD, Rosalyn Gess   . Eosinophilic esophagitis   . Dysphagia   . Orthostatic hypotension   . Overactive detrusor   . Dizziness and giddiness   . Sinusitis acute   . Personal history of other diseases of digestive system   . Anal or rectal pain   . Other malaise and fatigue   . Adjustment disorder with depressed mood   . Routine general medical examination at a health care facility   . Breast cancer 03/11/12    right breast lumpectomy=invasive lobular ca,grade I/III,ER?PR=positive  . Radiation 04/21/2012-06/11/2012    64 gray right breast    PAST SURGICAL HISTORY: Past Surgical History  Procedure Laterality Date  . Appendectomy    . Abdominal hysterectomy    . Hemmorhoidectomy    . Cardiac catherization    . Esophageal tumor excised; posterior 1986    . Electrocardiogram  10/30/2006  . Edg  04/21/2007  . Breast surgery      left breast x 2  . Cardiac catheterization      FAMILY HISTORY Family History  Problem Relation Age of Onset  . Bone cancer Father   . Cancer Father     Bone  . Heart disease Brother   . Colon cancer Neg Hx   . Breast cancer Neg Hx   . Diabetes Neg Hx   . Anesthesia problems Neg Hx    The patient's father died from "bone cancer" at the age of 31. The patient's mother died in an automobile accident at the age of 61. The patient had one brother who died from a heart attack at the age of 47. The patient had no sisters.  GYNECOLOGIC HISTORY: Menarche age 68, she is GX P1 with first live birth at age 39. She underwent to the change of life approximately 1978. She did not take hormone replacement.  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 Her husband died in a plane crash approximately 5 years ago. Her son, Merriam Brandner, 37, works in [employer] as an Personnel officer. The patient has no grandchildren. She lives alone with her cat Nicholaus Bloom.  ADVANCED  DIRECTIVES: in place per Dr Debby Bud' note  HEALTH MAINTENANCE: History  Substance Use Topics  . Smoking status: Former Smoker    Quit date: 10/22/1965  . Smokeless tobacco: Never Used  . Alcohol Use: 1.8 oz/week    3 Glasses of wine per week     Comment: occasional      Colonoscopy: 2010  PAP: 2011  Bone density: 2011/ osteopenia  Lipid panel: UTD per Dr Debby Bud  No Known Allergies  Current Outpatient Prescriptions  Medication Sig Dispense Refill  . amLODipine (NORVASC) 5 MG tablet TAKE ONE TABLET BY MOUTH ONE TIME DAILY  30 tablet  5  . amoxicillin-clavulanate (AUGMENTIN) 875-125 MG per tablet       . aspirin 325 MG EC tablet Take 325 mg by mouth daily.      Marland Kitchen exemestane (AROMASIN) 25 MG tablet Take 1 tablet (25 mg total) by mouth daily.  30 tablet  3  . polyethylene glycol (MIRALAX / GLYCOLAX) packet Take 17 g by mouth 3 (three) times a week.      . Senna-Psyllium (PERDIEM PO) Take 2 tablets by mouth at bedtime.       No current facility-administered medications for this visit.    OBJECTIVE: elderly white woman in no acute distress Filed Vitals:   03/31/13 1447  BP: 138/76  Pulse: 81  Temp: 97.9 F (36.6 C)  Resp: 20     Body mass index is 19.16 kg/(m^2).    ECOG FS:1 Filed Weights    03/31/13 1447  Weight: 104 lb 12.8 oz (47.537 kg)   Sclerae unicteric Oropharynx clear No cervical or supraclavicular adenopathy Lungs no rales or rhonchi, clear to auscultation bilaterally  Heart regular rate and rhythm. No murmur appreciated Abdomen soft, thin, nondistended, nontender, positive bowel sounds MSK no focal spinal tenderness, mild kyphosis, no peripheral edema Neuro: nonfocal, well oriented, positive affect. Breasts: The right breast is status post lumpectomy. Breast is mildly tender to palpation.  No evidence of disease recurrence. Left breast is unremarkable. Axillae benign bilaterally   LAB RESULTS: Lab Results  Component Value Date   WBC 6.6 03/25/2013   NEUTROABS 5.3 03/25/2013   HGB 13.0 03/25/2013   HCT 37.3 03/25/2013   MCV 92.4 03/25/2013   PLT 231 03/25/2013      Chemistry      Component Value Date/Time   NA 143 03/25/2013 0925   NA 138 06/19/2012 0450   K 4.2 03/25/2013 0925   K 3.6 06/19/2012 0450   CL 105 03/25/2013 0925   CL 100 06/19/2012 0450   CO2 30* 03/25/2013 0925   CO2 28 06/19/2012 0450   BUN 9.8 03/25/2013 0925   BUN 9 06/19/2012 0450   CREATININE 0.7 03/25/2013 0925   CREATININE 0.57 06/19/2012 0450      Component Value Date/Time   CALCIUM 9.3 03/25/2013 0925   CALCIUM 10.7* 06/19/2012 0450   ALKPHOS 94 03/25/2013 0925   ALKPHOS 232* 06/19/2012 0450   AST 18 03/25/2013 0925   AST 834* 06/19/2012 0450   ALT 13 03/25/2013 0925   ALT 443* 06/19/2012 0450   BILITOT 1.12 03/25/2013 0925   BILITOT 1.4* 06/19/2012 0450      Lab Results  Component Value Date   LABCA2 10 02/06/2012    STUDIES:  No results found.    ASSESSMENT: 67 year old [place] woman status post right lumpectomy 03/11/2012 for a pT1c N0, stage IA, invasive lobular breast cancer, grade 1, with a broadly positive margin which according to surgery  could not be cleared short of mastectomy. The tumor is strongly estrogen and progesterone receptor positive, with a low Mib-1 and no evidence of HER-2  amplification. The Oncotype recurrence score is 23, predicting a 15% chance of distant recurrence with adjuvant tamoxifen. The Adjuvant! Program predicts a risk of recurrence of 17%, dropping to 9% with aromatase inhibitors.  (1) completed adjuvant irradiation 06/11/2012  (2) started anastrozole 06/11/2012 and held since 06/19/2012 due to acute abdominal pain and elevated LFTs.    (3)  Started letrozole in October 2013, discontinued January 2014 with poor tolerance  (4) osteoporosis: yearly zolendronic acid, first dose 01/20/2013, tolerated well  (5) exemestane started mod April 2014, but tolerated poorly  (6) questionable lung lesions requiring further follow-up, scheduled for chest CT in September 2014  PLAN:  Crystal [name] is still having a difficult time tolerating the exemestane. After review with Dr. Darnelle Catalan, and we have advised Crystal [name] to hold the exemestane for 3-4 weeks, then resume, taking it every third day. If she tolerates this we may gradually increase her dose. If she does not tolerate this, she will stop the exemestane and we will review her options when she returns in September. Recall that we would want to avoid tamoxifen because of her history of TIAs.   At least while she is on an aromatase inhibitor, we'll continue to treat with zoledronic acid yearly, due again in April 2015. She also continue taking calcium vitamin D, and will walk on a regular basis.  Crystal [name] is already scheduled to see Dr. Darnelle Catalan in September, and just prior to that visit we will repeat a chest CT for short term followup of some questionable lung lesions noted on previous studies. These may very likely be radiation changes, but definitely warrants followup. All this was reviewed in detail with [facility] today, and she was given this information in writing as well as. She voices understanding and agreement with this plan.    Alinna Siple    03/31/2013

## 2013-03-31 NOTE — Telephone Encounter (Signed)
, °

## 2013-04-27 ENCOUNTER — Other Ambulatory Visit (INDEPENDENT_AMBULATORY_CARE_PROVIDER_SITE_OTHER): Payer: Self-pay | Admitting: General Surgery

## 2013-04-27 DIAGNOSIS — Z853 Personal history of malignant neoplasm of breast: Secondary | ICD-10-CM

## 2013-04-29 ENCOUNTER — Other Ambulatory Visit: Payer: Self-pay | Admitting: Internal Medicine

## 2013-05-01 ENCOUNTER — Telehealth (INDEPENDENT_AMBULATORY_CARE_PROVIDER_SITE_OTHER): Payer: Self-pay

## 2013-05-01 NOTE — Telephone Encounter (Signed)
Signed order faxed to [facility] for patient by Dr. Biagio Quint.  Confirmation rec'd and attached to outgoing fax.

## 2013-05-04 ENCOUNTER — Ambulatory Visit
Admission: RE | Admit: 2013-05-04 | Discharge: 2013-05-04 | Disposition: A | Discharge status: Home / Self Care | Payer: Medicare Other | Source: Ambulatory Visit | Attending: General Surgery | Admitting: General Surgery

## 2013-05-04 ENCOUNTER — Other Ambulatory Visit (INDEPENDENT_AMBULATORY_CARE_PROVIDER_SITE_OTHER): Payer: Self-pay | Admitting: General Surgery

## 2013-05-04 ENCOUNTER — Other Ambulatory Visit: Payer: Medicare Other

## 2013-05-04 DIAGNOSIS — R922 Inconclusive mammogram: Secondary | ICD-10-CM | POA: Diagnosis not present

## 2013-05-04 DIAGNOSIS — Z853 Personal history of malignant neoplasm of breast: Secondary | ICD-10-CM

## 2013-05-11 DIAGNOSIS — S91309A Unspecified open wound, unspecified foot, initial encounter: Secondary | ICD-10-CM | POA: Diagnosis not present

## 2013-05-15 ENCOUNTER — Ambulatory Visit (INDEPENDENT_AMBULATORY_CARE_PROVIDER_SITE_OTHER): Payer: Medicare Other | Admitting: General Surgery

## 2013-05-15 ENCOUNTER — Encounter (INDEPENDENT_AMBULATORY_CARE_PROVIDER_SITE_OTHER): Payer: Self-pay | Admitting: General Surgery

## 2013-05-15 VITALS — BP 124/70 | HR 70 | Temp 98.7°F | Resp 15 | Ht 62.0 in | Wt 101.6 lb

## 2013-05-15 DIAGNOSIS — Z853 Personal history of malignant neoplasm of breast: Secondary | ICD-10-CM

## 2013-05-15 NOTE — Progress Notes (Signed)
Subjective:     Patient ID: Crystal [name], female   DOB: 04-Jul-1946, 67 y.o.   MRN: [id number]  HPI This patient follows up status post right breast lumpectomy and sentinel lymph node biopsy in May of 2013 for a T1C N0 M0 right breast cancer. She had positive posterior  Muscular margin and she had followup radiation therapy. She has not tolerated well her hormonal therapy and is currently on exemestane.  She says that she is doing her breast exams and has not noted any suspicious changes. She denies any systemic symptoms. She also has some lung nodules which were found on CT scan a few months ago and she is scheduled for a followup CT scan of this in September. She recently had bilateral mammograms and there was no evidence of suspicious lesions identified. Six-month followup mammogram was recommended  Review of Systems     Objective:   Physical Exam No distress and nontoxic-appearing Her right breast shows postsurgical changes in the right lower aspect of her breast. Her incision is well-healed and she has good cosmesis without any significant dimpling. She does have some radiation changes in the area but no suspicious masses. The remainder of the breast is normal without any suspicious masses, skin changes, or lymphadenopathy Her left breast has a well-healed surgical scar from prior biopsy but no suspicious masses, skin changes, or lymphadenopathy    Assessment:      history of right breast cancer She has no evidence of recurrence on physical exam his both on my exam in the patient's exam. Her recent mammograms were without any suspicious lesions as well. I did review with her her situation and the surgical findings. Her positive posterior margin was the muscular margins and I again explained that even with mastectomy  The muscular margin would likely be positive since this is the same margin that is removed with mastectomy. The lumpectomy was taken down to the muscular edge. I did recommend  that she continue with monthly self breast exams and her bilateral mammogram in 6 months as recommended at the time of her last mammogram. I agree that MRI might be a good option for her and at the time of her annual screening mammogram next May-June timeframe, MRI would be recommended.    Plan:     Continue monthly self breast exams Followup CT scan of the chest in September to followup her lung nodules Six-month bilateral mammogram MRI of the breast next May to June F/u with me after next mammogram

## 2013-05-27 ENCOUNTER — Other Ambulatory Visit: Payer: Self-pay

## 2013-07-13 ENCOUNTER — Encounter (HOSPITAL_COMMUNITY): Payer: Self-pay

## 2013-07-13 ENCOUNTER — Other Ambulatory Visit (HOSPITAL_BASED_OUTPATIENT_CLINIC_OR_DEPARTMENT_OTHER): Payer: Medicare Other

## 2013-07-13 ENCOUNTER — Ambulatory Visit (HOSPITAL_COMMUNITY)
Admission: RE | Admit: 2013-07-13 | Discharge: 2013-07-13 | Disposition: A | Discharge status: Home / Self Care | Payer: Medicare Other | Source: Ambulatory Visit | Attending: Oncology | Admitting: Oncology

## 2013-07-13 DIAGNOSIS — J701 Chronic and other pulmonary manifestations due to radiation: Secondary | ICD-10-CM | POA: Diagnosis not present

## 2013-07-13 DIAGNOSIS — R918 Other nonspecific abnormal finding of lung field: Secondary | ICD-10-CM | POA: Insufficient documentation

## 2013-07-13 DIAGNOSIS — N289 Disorder of kidney and ureter, unspecified: Secondary | ICD-10-CM | POA: Diagnosis not present

## 2013-07-13 DIAGNOSIS — C50919 Malignant neoplasm of unspecified site of unspecified female breast: Secondary | ICD-10-CM | POA: Diagnosis not present

## 2013-07-13 DIAGNOSIS — C50319 Malignant neoplasm of lower-inner quadrant of unspecified female breast: Secondary | ICD-10-CM | POA: Diagnosis not present

## 2013-07-13 DIAGNOSIS — E279 Disorder of adrenal gland, unspecified: Secondary | ICD-10-CM | POA: Insufficient documentation

## 2013-07-13 DIAGNOSIS — I2510 Atherosclerotic heart disease of native coronary artery without angina pectoris: Secondary | ICD-10-CM | POA: Insufficient documentation

## 2013-07-13 DIAGNOSIS — R05 Cough: Secondary | ICD-10-CM | POA: Insufficient documentation

## 2013-07-13 DIAGNOSIS — Z923 Personal history of irradiation: Secondary | ICD-10-CM | POA: Diagnosis not present

## 2013-07-13 DIAGNOSIS — R059 Cough, unspecified: Secondary | ICD-10-CM | POA: Diagnosis not present

## 2013-07-13 DIAGNOSIS — Z9221 Personal history of antineoplastic chemotherapy: Secondary | ICD-10-CM | POA: Insufficient documentation

## 2013-07-13 DIAGNOSIS — J984 Other disorders of lung: Secondary | ICD-10-CM | POA: Diagnosis not present

## 2013-07-13 LAB — COMPREHENSIVE METABOLIC PANEL (CC13)
AST: 18 U/L (ref 5–34)
Alkaline Phosphatase: 85 U/L (ref 40–150)
BUN: 14 mg/dL (ref 7.0–26.0)
Creatinine: 0.7 mg/dL (ref 0.6–1.1)
Glucose: 93 mg/dl (ref 70–140)
Potassium: 4.1 mEq/L (ref 3.5–5.1)
Total Bilirubin: 0.9 mg/dL (ref 0.20–1.20)

## 2013-07-13 LAB — CBC WITH DIFFERENTIAL/PLATELET
Basophils Absolute: 0.1 10*3/uL (ref 0.0–0.1)
EOS%: 1.1 % (ref 0.0–7.0)
HGB: 12.4 g/dL (ref 11.6–15.9)
LYMPH%: 11 % — ABNORMAL LOW (ref 14.0–49.7)
MCH: 31.5 pg (ref 25.1–34.0)
MCV: 92.8 fL (ref 79.5–101.0)
MONO%: 4.8 % (ref 0.0–14.0)
NEUT%: 82.3 % — ABNORMAL HIGH (ref 38.4–76.8)
Platelets: 254 10*3/uL (ref 145–400)
RDW: 12.9 % (ref 11.2–14.5)

## 2013-07-13 MED ORDER — IOHEXOL 300 MG/ML  SOLN
100.0000 mL | Freq: Once | INTRAMUSCULAR | Status: AC | PRN
  Administered 2013-07-13: 80 mL via INTRAVENOUS

## 2013-07-20 ENCOUNTER — Ambulatory Visit (HOSPITAL_BASED_OUTPATIENT_CLINIC_OR_DEPARTMENT_OTHER): Payer: Medicare Other | Admitting: Oncology

## 2013-07-20 VITALS — BP 145/79 | HR 84 | Temp 98.5°F | Resp 20 | Ht 62.0 in | Wt 104.5 lb

## 2013-07-20 DIAGNOSIS — Z170 Estrogen receptor positive status [ER+]: Secondary | ICD-10-CM

## 2013-07-20 DIAGNOSIS — C50319 Malignant neoplasm of lower-inner quadrant of unspecified female breast: Secondary | ICD-10-CM

## 2013-07-20 DIAGNOSIS — M810 Age-related osteoporosis without current pathological fracture: Secondary | ICD-10-CM

## 2013-07-20 DIAGNOSIS — C50311 Malignant neoplasm of lower-inner quadrant of right female breast: Secondary | ICD-10-CM | POA: Insufficient documentation

## 2013-07-20 DIAGNOSIS — F411 Generalized anxiety disorder: Secondary | ICD-10-CM

## 2013-07-20 DIAGNOSIS — G4700 Insomnia, unspecified: Secondary | ICD-10-CM | POA: Diagnosis not present

## 2013-07-20 NOTE — Addendum Note (Signed)
Addended by: Billey Co on: 07/20/2013 05:00 PM   Modules accepted: Orders

## 2013-07-20 NOTE — Progress Notes (Signed)
ID: [name]   DOB: 1946/01/13  MR#: [id number]  WUJ#:[id number]  PCP: Crystal Regulus, [name]   HISTORY OF PRESENT ILLNESS: The patient saw Dr. Huel [name] for routine gynecologic followup and she noted a palpable mass in the right breast she said Crystal [name] up for right mammography and ultrasonography, performed at the breast center 01/29/2012. The breasts were heterogeneously dense. It mass with dense calcification was noted centrally in the right breast consistent with a benign fibroadenoma. The new palpable mass was not well demonstrated by mammography. It was firm, mobile, and measured approximately 1 cm by palpation. Ultrasound showed an irregularly marginated hypoechoic mass in this area, measuring 1.1 cm.  Biopsy was performed the same day, and showed [phone]) an invasive lobular breast cancer, e-cadherin negative,grade 1, estrogen and progesterone receptor positive, both of 100%, with an MIB-1 of 14%, and no HER-2 amplification.  The patient underwent a left diagnostic mammography 01/30/2012 and bilateral breast MRI 0415/2013.there were no other areas of concern in either breast, and there was no adenopathy noted. By MRI of the right breast mass measured 1.4 cm. Her subsequent history is as detailed below.  INTERVAL HISTORY: Crystal [name] returns today for followup of her right breast cancer. She is on exemestane every third day and even on that very low dose she is having a hard time staying on it. She is very motivated. However she has developed severe anxiety. She can't sleep at night. Her minus churning over and she just can't get any rest. We discussed this for a long time today and my suggestion is that we stop the exemestane and that we get her to one of our counselors, after which we can consider whether we want to start lorazepam, or mirtazapine, or both.   REVIEW OF SYSTEMS: Crystal [name] is doing well and aside from the anxiety. She gets out of the house just about every day,  has lungs with friends, does walking and shopping. She needs new glasses. Does a little bit of a dry cough. Her appetite is better. She has pain in both knees which is no worse also with the exemestane. She has a little back pain as well. Otherwise a detailed review of systems today was stable  PAST MEDICAL HISTORY: Past Medical History  Diagnosis Date  . Abdominal aortic ectasia 01/31/2010    Qualifier: Diagnosis of  By: Crystal Bud [name], Crystal [name] ABDOMINAL PAIN RIGHT UPPER QUADRANT 11/07/2010    Qualifier: Diagnosis of  By: Crystal Motto [name] Crystal [name] CEREBROVASCULAR DISEASE 06/16/2010    Qualifier: Diagnosis of  By: Crystal Ruiz [name], Crystal [name]   . CONSTIPATION 01/12/2009    Qualifier: Diagnosis of  By: Crystal [name] CMA (AAMA), Crystal [name]    . Cough 08/15/2010    Qualifier: Diagnosis of  By: Crystal Motto [name] Crystal [name] DETRUSOR, OVERACTIVE 03/02/2010    Qualifier: Diagnosis of  By: Crystal Bud [name], Crystal [name] ESOPHAGEAL STRICTURE 01/12/2009    Qualifier: Diagnosis of  By: Crystal [name] CMA (AAMA), Crystal [name]    . EXTERNAL HEMORRHOIDS 01/12/2009    Qualifier: Diagnosis of  By: Crystal [name] CMA (AAMA), Crystal [name]    . GERD 05/10/2007    Qualifier: Diagnosis of  By: Crystal [name] RMA, Crystal [name]    . HYPERLIPIDEMIA 05/10/2007    Qualifier: Diagnosis of  By: Crystal [name], Crystal [name]    . HYPERTENSION 05/10/2007    Qualifier: Diagnosis of  By: Crystal [name], Crystal [name]    . HYPOTENSION, ORTHOSTATIC 06/16/2010  Qualifier: Diagnosis of  By: Crystal Ruiz [name], Crystal [name]   . Irritable bowel syndrome 01/12/2009    Qualifier: Diagnosis of  By: Crystal [name] CMA (AAMA), Crystal [name]    . MELANOSIS COLI 02/16/2009    Qualifier: Diagnosis of  By: Crystal [name] CMA (AAMA), Crystal [name]    . PERIPHERAL VASCULAR DISEASE 06/17/2010    Qualifier: Diagnosis of  By: Crystal Ruiz [name], Crystal [name]   . RECTAL FISSURE 02/16/2009    Qualifier: Diagnosis of  By: Crystal [name] CMA (AAMA), Crystal [name]    . STENOSIS, RECTAL 02/16/2009    Qualifier: Diagnosis of  By: Crystal [name] CMA (AAMA), Crystal [name]    . SYNCOPE 04/19/2008    Qualifier: Diagnosis of  By: Crystal [name], Crystal [name]   . TACHYARRHYTHMIA 09/03/2009    Qualifier: Diagnosis of  By: Crystal Bud [name], Crystal [name] TRANSIENT ISCHEMIC ATTACK 03/02/2010    Qualifier: Diagnosis of  By: Crystal Bud [name], Crystal [name] VENOUS INSUFFICIENCY, LEGS 07/18/2010    Qualifier: Diagnosis of  By: Crystal Bud [name], Crystal [name]   . Eosinophilic esophagitis   . Dysphagia   . Orthostatic hypotension   . Overactive detrusor   . Dizziness and giddiness   . Sinusitis acute   . Personal history of other diseases of digestive system   . Anal or rectal pain   . Other malaise and fatigue   . Adjustment disorder with depressed mood   . Routine general medical examination at a health care facility   . Breast cancer 03/11/12    right breast lumpectomy=invasive lobular ca,grade I/III,ER?PR=positive  . Radiation 04/21/2012-06/11/2012    64 gray right breast    PAST SURGICAL HISTORY: Past Surgical History  Procedure Laterality Date  . Appendectomy    . Abdominal hysterectomy    . Hemmorhoidectomy    . Cardiac catherization    . Esophageal tumor excised; posterior 1986    . Electrocardiogram  10/30/2006  . Edg  04/21/2007  . Breast surgery      left breast x 2  . Cardiac catheterization      FAMILY HISTORY Family History  Problem Relation Age of Onset  . Bone cancer Father   . Cancer Father     Bone  . Heart disease Brother   . Colon cancer Neg Hx   . Breast cancer Neg Hx   . Diabetes Neg Hx   . Anesthesia problems Neg Hx   The patient's father died from "bone cancer" at the age of 79. The patient's mother died in an automobile accident at the age of 26. The patient had one brother who died from a heart attack at the age of 66. The patient had no sisters.  GYNECOLOGIC HISTORY: Menarche age 39, she is GX P1 with first live birth at age 37. She underwent to the change of life approximately 1978. She did not take hormone replacement.  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 Her husband died in a  plane crash approximately 5 years ago. Her son, Crystal [name], 37, works in [employer] as an Personnel officer. The patient has no grandchildren. She lives alone with her cat Nicholaus Bloom.  ADVANCED DIRECTIVES: in place per Dr Crystal [name]' note  HEALTH MAINTENANCE: History  Substance Use Topics  . Smoking status: Former Smoker    Quit date: 10/22/1965  . Smokeless tobacco: Never Used  . Alcohol Use: 1.8 oz/week    3 Glasses of wine per week     Comment: occasional  Colonoscopy: 2010  PAP: 2011  Bone density: 2011/ osteopenia  Lipid panel: UTD per Dr Crystal [name]  No Known Allergies  Current Outpatient Prescriptions  Medication Sig Dispense Refill  . amLODipine (NORVASC) 5 MG tablet TAKE ONE TABLET BY MOUTH ONE TIME DAILY  30 tablet  4  . amoxicillin-clavulanate (AUGMENTIN) 875-125 MG per tablet       . aspirin 325 MG EC tablet Take 325 mg by mouth daily.      Marland Kitchen exemestane (AROMASIN) 25 MG tablet Take 1 tablet (25 mg total) by mouth daily.  30 tablet  3  . polyethylene glycol (MIRALAX / GLYCOLAX) packet Take 17 g by mouth 3 (three) times a week.      . Senna-Psyllium (PERDIEM PO) Take 2 tablets by mouth at bedtime.       No current facility-administered medications for this visit.    OBJECTIVE: elderly white woman who appears stated age 8 Vitals:   07/20/13 1515  BP: 145/79  Pulse: 84  Temp: 98.5 F (36.9 C)  Resp: 20     Body mass index is 19.11 kg/(m^2).    ECOG FS:1 Filed Weights   07/20/13 1515  Weight: 104 lb 8 oz (47.401 kg)   Sclerae unicteric, pupils equal round and reactive to light Oropharynx clear No cervical or supraclavicular adenopathy Lungs no rales or rhonchi, clear to auscultation bilaterally  Heart regular rate and rhythm. No murmur appreciated Abdomen soft, thin, nondistended, nontender, positive bowel sounds MSK no focal spinal tenderness, mild kyphosis, no peripheral edema Neuro: nonfocal, well oriented, positive affect. Breasts: The right breast is status  post lumpectomy and radiation. There is no evidence of disease recurrence. The right axilla is benign Left breast is unremarkable.    LAB RESULTS: Lab Results  Component Value Date   WBC 6.9 07/13/2013   NEUTROABS 5.7 07/13/2013   HGB 12.4 07/13/2013   HCT 36.4 07/13/2013   MCV 92.8 07/13/2013   PLT 254 07/13/2013      Chemistry      Component Value Date/Time   NA 145 07/13/2013 1006   NA 138 06/19/2012 0450   K 4.1 07/13/2013 1006   K 3.6 06/19/2012 0450   CL 105 03/25/2013 0925   CL 100 06/19/2012 0450   CO2 28 07/13/2013 1006   CO2 28 06/19/2012 0450   BUN 14.0 07/13/2013 1006   BUN 9 06/19/2012 0450   CREATININE 0.7 07/13/2013 1006   CREATININE 0.57 06/19/2012 0450      Component Value Date/Time   CALCIUM 9.6 07/13/2013 1006   CALCIUM 10.7* 06/19/2012 0450   ALKPHOS 85 07/13/2013 1006   ALKPHOS 232* 06/19/2012 0450   AST 18 07/13/2013 1006   AST 834* 06/19/2012 0450   ALT 12 07/13/2013 1006   ALT 443* 06/19/2012 0450   BILITOT 0.90 07/13/2013 1006   BILITOT 1.4* 06/19/2012 0450      Lab Results  Component Value Date   LABCA2 10 02/06/2012    STUDIES:  Ct Chest W Contrast  07/13/2013   *RADIOLOGY REPORT*  Clinical Data: Breast cancer.  Chemotherapy and radiation therapy complete.  Cough.  Lung nodules.  CT CHEST WITH CONTRAST  Technique:  Multidetector CT imaging of the chest was performed following the standard protocol during bolus administration of intravenous contrast.  Contrast: 80mL OMNIPAQUE IOHEXOL 300 MG/ML  SOLN  Comparison: 12/23/2012 and 07/02/2012.  Findings: Sub centimeter low attenuation lesions are again seen in the thyroid.  The upper esophagus is dilated and air-filled.  No pathologically enlarged mediastinal, hilar or axillary lymph nodes. Coronary artery calcification.  Heart size normal.  No pericardial effusion.  Postoperative changes are seen in the right breast and right axilla.  Biapical pleural parenchymal scarring, right greater than left. Subpleural radiation  fibrosis in the anterior right lung. Bronchiectasis and mucoid impaction in the right lower lobe (image 36). Increased pleural thickening and subpleural opacity in the anterolateral aspect of the lower right hemithorax (image 42).  A 3- 4 mm nodule in the right upper lobe (SPECT) is unchanged from 07/02/2012.  No pleural fluid.  Airway is unremarkable.  Incidental imaging of the upper abdomen shows no acute findings. Low attenuation lesions in the kidneys measure up to 7 mm on the left.  Low attenuation lesion in the spleen is seen medially, measuring 7 mm, as before.  No worrisome lytic or sclerotic lesions.  IMPRESSION:  1.  Postoperative changes in the right breast and right axilla without evidence of metastatic disease. 2.  Pleural thickening and subpleural opacity centered along the lateral aspect of the right major fissure, which may be due to scarring.  Continued attention on follow-up exams is warranted. 3.  Scattered tiny pulmonary nodules are unchanged are resolved when compared to 07/02/2012.   Original Report Authenticated By: Leanna Battles, M.D.      ASSESSMENT: 67 y.o.  [place] woman status post right lumpectomy 03/11/2012 for a pT1c N0, stage IA, invasive lobular breast cancer, grade 1, with a broadly positive margin which according to surgery could not be cleared short of mastectomy. The tumor is strongly estrogen and progesterone receptor positive, with a low Mib-1 and no evidence of HER-2 amplification. The Oncotype recurrence score is 23, predicting a 15% chance of distant recurrence with adjuvant tamoxifen. The Adjuvant! Program predicts a risk of recurrence of 17%, dropping to 9% with aromatase inhibitors.  (1) completed adjuvant irradiation 06/11/2012  (2) started anastrozole 06/11/2012 and held since 06/19/2012 due to acute abdominal pain and elevated LFTs.    (3)  Started letrozole in October 2013, discontinued January 2014 with poor tolerance  (4) osteoporosis: yearly  zolendronic acid, first dose 01/20/2013, tolerated well  (5) exemestane started mod April 2014, but tolerated poorly  (6) questionable lung lesions found to be stable to resolved by chest CT 07/13/2013--no further followup needed  PLAN:  Crystal [name] is still making a huge effort to stem the exemestane, but even at once every 3 days as she is tolerating it well. I really don't know if the exemestane is responsible for her anxiety, but it is severe and fairly disabling, although again she is doing her best to get out of the house every day and the positive.  After much discussion we decided she would #1. Stop the exemestane #2 meet with our counselor, Dr. Noe Gens to see if she can get control of anxiety instead of letting the anxiety control heart, and #3 we discussed the possibility of using lorazepam or mirtazapine or both. I would like to wait until she sees her cancel her before starting any prescriptions.   As far as the breast cancer itself is concerned Crystal [name] was reassured by the repeat CT scan which shows the lung nodules are benign. There is a little bit of thickening of the right major fissure, which is going to be related to her prior radiation treatments. I have set up Crystal [name] to return to see me in January. We'll be in touch before then regarding the possibility of starting the agents just mentioned. If  and when we can get anxiety under control we can give the exemestane yet another to.    MAGRINAT,GUSTAV C    07/20/2013

## 2013-07-28 ENCOUNTER — Ambulatory Visit (INDEPENDENT_AMBULATORY_CARE_PROVIDER_SITE_OTHER): Payer: Medicare Other

## 2013-07-28 DIAGNOSIS — Z23 Encounter for immunization: Secondary | ICD-10-CM | POA: Diagnosis not present

## 2013-08-05 ENCOUNTER — Ambulatory Visit: Payer: Medicare Other | Admitting: Psychiatry

## 2013-08-10 DIAGNOSIS — D235 Other benign neoplasm of skin of trunk: Secondary | ICD-10-CM | POA: Diagnosis not present

## 2013-08-10 DIAGNOSIS — L219 Seborrheic dermatitis, unspecified: Secondary | ICD-10-CM | POA: Diagnosis not present

## 2013-08-25 DIAGNOSIS — R350 Frequency of micturition: Secondary | ICD-10-CM | POA: Diagnosis not present

## 2013-08-25 DIAGNOSIS — N281 Cyst of kidney, acquired: Secondary | ICD-10-CM | POA: Diagnosis not present

## 2013-08-25 DIAGNOSIS — N3946 Mixed incontinence: Secondary | ICD-10-CM | POA: Diagnosis not present

## 2013-09-24 ENCOUNTER — Ambulatory Visit (INDEPENDENT_AMBULATORY_CARE_PROVIDER_SITE_OTHER): Payer: Medicare Other | Admitting: General Surgery

## 2013-09-24 ENCOUNTER — Encounter (INDEPENDENT_AMBULATORY_CARE_PROVIDER_SITE_OTHER): Payer: Self-pay | Admitting: General Surgery

## 2013-09-24 VITALS — BP 134/80 | HR 70 | Temp 98.1°F | Resp 14 | Ht 62.0 in | Wt 101.2 lb

## 2013-09-24 DIAGNOSIS — Z853 Personal history of malignant neoplasm of breast: Secondary | ICD-10-CM | POA: Diagnosis not present

## 2013-09-24 NOTE — Progress Notes (Signed)
Subjective:     Patient ID: Crystal [name], female   DOB: April 24, 1946, 67 y.o.   MRN: [id number]  HPI Patient follows up status post right breast lumpectomy for right breast cancer. She says that she is doing her monthly self breast exams and denies any suspicious masses or lesions. She has had some weight loss which she attributes to anxiety associated with her adjuvant medications. She was recently taken off of his medications by her medical oncologist and since then, she says that she has been much improved. Her last mammogram in July was negative for evidence of recurrence  Review of Systems     Objective:   Physical Exam No acute distress and nontoxic-appearing Her right breast demonstrates no evidence of any suspicious lesions or recurrence. I do not appreciate any palpable masses, there are no skin changes, and there is no lymphadenopathy.  Her scar is well-healed. Her left breast is normal without any suspicious masses, skin changes, or lymphadenopathy. She has a well-healed surgical scar around the nipple areolar complex    Assessment:     History of right breast cancer She is at high risk due to 2 history of positive posterior margins although there is no evidence of recurrent disease by history or exam. Her CT of the chest for followup of some lung nodules in September was negative. Her mammogram in July was negative. I think that she seems to be doing very well and again there is no evidence of recurrent disease both on her exam or mine. We again discussed the high-risk nature of her tumor and I recommended followup MRI of the breast in May with followup with surgery at that time. I explained that we'll be we will partners to see her in followup and if that is normal and her exam is normal at that time, she could probably go to annual followup     Plan:     Continue monthly self breast exams and follow up with [facility] surgery in 6 months for clinical exam. MRI of the  breast and may in followup after that

## 2013-09-25 ENCOUNTER — Other Ambulatory Visit: Payer: Self-pay | Admitting: Internal Medicine

## 2013-10-06 ENCOUNTER — Other Ambulatory Visit: Payer: Self-pay | Admitting: Oncology

## 2013-10-06 DIAGNOSIS — Z853 Personal history of malignant neoplasm of breast: Secondary | ICD-10-CM

## 2013-10-20 ENCOUNTER — Telehealth: Payer: Self-pay

## 2013-10-20 ENCOUNTER — Ambulatory Visit (INDEPENDENT_AMBULATORY_CARE_PROVIDER_SITE_OTHER): Payer: Medicare Other

## 2013-10-20 DIAGNOSIS — Z23 Encounter for immunization: Secondary | ICD-10-CM | POA: Diagnosis not present

## 2013-10-20 NOTE — Telephone Encounter (Signed)
The patient came in and wants her pharmacy switched to [facility] in [place] instead of [address].

## 2013-10-20 NOTE — Telephone Encounter (Signed)
Pharmacy has been changed to [facility] in [place], [place].

## 2013-11-05 ENCOUNTER — Other Ambulatory Visit: Payer: Self-pay | Admitting: Oncology

## 2013-11-05 ENCOUNTER — Ambulatory Visit
Admission: RE | Admit: 2013-11-05 | Discharge: 2013-11-05 | Disposition: A | Discharge status: Home / Self Care | Payer: Medicare Other | Source: Ambulatory Visit | Attending: Oncology | Admitting: Oncology

## 2013-11-05 DIAGNOSIS — N6039 Fibrosclerosis of unspecified breast: Secondary | ICD-10-CM | POA: Diagnosis not present

## 2013-11-05 DIAGNOSIS — Z853 Personal history of malignant neoplasm of breast: Secondary | ICD-10-CM

## 2013-11-17 ENCOUNTER — Ambulatory Visit (HOSPITAL_BASED_OUTPATIENT_CLINIC_OR_DEPARTMENT_OTHER): Payer: Medicare Other | Admitting: Oncology

## 2013-11-17 ENCOUNTER — Other Ambulatory Visit (HOSPITAL_BASED_OUTPATIENT_CLINIC_OR_DEPARTMENT_OTHER): Payer: Medicare Other

## 2013-11-17 ENCOUNTER — Telehealth: Payer: Self-pay | Admitting: Oncology

## 2013-11-17 VITALS — BP 139/76 | HR 80 | Temp 97.6°F | Resp 18 | Ht 62.0 in | Wt 102.4 lb

## 2013-11-17 DIAGNOSIS — G459 Transient cerebral ischemic attack, unspecified: Secondary | ICD-10-CM

## 2013-11-17 DIAGNOSIS — K222 Esophageal obstruction: Secondary | ICD-10-CM | POA: Diagnosis not present

## 2013-11-17 DIAGNOSIS — C50311 Malignant neoplasm of lower-inner quadrant of right female breast: Secondary | ICD-10-CM

## 2013-11-17 DIAGNOSIS — K589 Irritable bowel syndrome without diarrhea: Secondary | ICD-10-CM | POA: Diagnosis not present

## 2013-11-17 DIAGNOSIS — C50319 Malignant neoplasm of lower-inner quadrant of unspecified female breast: Secondary | ICD-10-CM

## 2013-11-17 DIAGNOSIS — R55 Syncope and collapse: Secondary | ICD-10-CM

## 2013-11-17 LAB — COMPREHENSIVE METABOLIC PANEL (CC13)
ALT: 14 U/L (ref 0–55)
ANION GAP: 10 meq/L (ref 3–11)
AST: 20 U/L (ref 5–34)
Albumin: 4.2 g/dL (ref 3.5–5.0)
Alkaline Phosphatase: 91 U/L (ref 40–150)
BUN: 14.3 mg/dL (ref 7.0–26.0)
CALCIUM: 9.8 mg/dL (ref 8.4–10.4)
CHLORIDE: 105 meq/L (ref 98–109)
CO2: 28 mEq/L (ref 22–29)
CREATININE: 0.8 mg/dL (ref 0.6–1.1)
Glucose: 107 mg/dl (ref 70–140)
Potassium: 4 mEq/L (ref 3.5–5.1)
Sodium: 142 mEq/L (ref 136–145)
Total Bilirubin: 0.85 mg/dL (ref 0.20–1.20)
Total Protein: 7.1 g/dL (ref 6.4–8.3)

## 2013-11-17 LAB — CBC WITH DIFFERENTIAL/PLATELET
BASO%: 0.6 % (ref 0.0–2.0)
BASOS ABS: 0 10*3/uL (ref 0.0–0.1)
EOS%: 0.4 % (ref 0.0–7.0)
Eosinophils Absolute: 0 10*3/uL (ref 0.0–0.5)
HEMATOCRIT: 36.6 % (ref 34.8–46.6)
HEMOGLOBIN: 12.6 g/dL (ref 11.6–15.9)
LYMPH#: 1.1 10*3/uL (ref 0.9–3.3)
LYMPH%: 13.5 % — ABNORMAL LOW (ref 14.0–49.7)
MCH: 32 pg (ref 25.1–34.0)
MCHC: 34.3 g/dL (ref 31.5–36.0)
MCV: 93.2 fL (ref 79.5–101.0)
MONO#: 0.4 10*3/uL (ref 0.1–0.9)
MONO%: 4.6 % (ref 0.0–14.0)
NEUT#: 6.9 10*3/uL — ABNORMAL HIGH (ref 1.5–6.5)
NEUT%: 80.9 % — AB (ref 38.4–76.8)
PLATELETS: 276 10*3/uL (ref 145–400)
RBC: 3.92 10*6/uL (ref 3.70–5.45)
RDW: 12.5 % (ref 11.2–14.5)
WBC: 8.5 10*3/uL (ref 3.9–10.3)

## 2013-11-17 NOTE — Progress Notes (Signed)
ID: [name]   DOB: 06-13-1946  MR#: [id number]  KJZ#:[id number]  PCP: Crystal Hare, MD   HISTORY OF PRESENT ILLNESS: The patient saw Dr. Paula [name] for routine gynecologic followup and she noted a palpable mass in the right breast she said [name] up for right mammography and ultrasonography, performed at the breast center 01/29/2012. The breasts were heterogeneously dense. It mass with dense calcification was noted centrally in the right breast consistent with a benign fibroadenoma. The new palpable mass was not well demonstrated by mammography. It was firm, mobile, and measured approximately 1 cm by palpation. Ultrasound showed an irregularly marginated hypoechoic mass in this area, measuring 1.1 cm.  Biopsy was performed the same day, and showed [phone] an invasive lobular breast cancer, e-cadherin negative,grade 1, estrogen and progesterone receptor positive, both of 100%, with an MIB-1 of 14%, and no HER-2 amplification.  The patient underwent a left diagnostic mammography 01/30/2012 and bilateral breast MRI 0415/2013.there were no other areas of concern in either breast, and there was no adenopathy noted. By MRI of the right breast mass measured 1.4 cm. Her subsequent history is as detailed below.  INTERVAL HISTORY: Crystal [name] returns today for followup of her right breast cancer accompanied by her friend Crystal [name]. Since her last visit here Crystal [name] completely discontinued the exemestane. She found after she stopped taking it her appetite improved, she slept better, and she was less anxious. However she never followed up with our suggestion that she meet with our counselor  REVIEW OF SYSTEMS: Crystal [name] enjoyed the holidays. 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 She has palpitations, when she gets anxious or upset. She has stress urinary incontinence. She bruises easily. She has problems with psoriasis, which is mild. Overall though a detailed review of systems did  not elicit any symptoms suggestive of disease recurrence or progression.  PAST MEDICAL HISTORY: Past Medical History  Diagnosis Date  . Abdominal aortic ectasia 01/31/2010    Qualifier: Diagnosis of  By: Crystal Hedges MD, [place] PAIN RIGHT UPPER QUADRANT 11/07/2010    Qualifier: Diagnosis of  By: Crystal Iles MD FACG, [place] DISEASE 06/16/2010    Qualifier: Diagnosis of  By: Crystal Reichmann MD, Crystal [name]   . CONSTIPATION 01/12/2009    Qualifier: Diagnosis of  By: Crystal [name] CMA ([place]), Mearl Latin    . Cough 08/15/2010    Qualifier: Diagnosis of  By: Crystal Iles MD Byrd Hesselbach DETRUSOR, OVERACTIVE 03/02/2010    Qualifier: Diagnosis of  By: Crystal Hedges MD, [place] STRICTURE 01/12/2009    Qualifier: Diagnosis of  By: Crystal [name] CMA ([place]), Mearl Latin    . EXTERNAL HEMORRHOIDS 01/12/2009    Qualifier: Diagnosis of  By: Crystal [name] CMA ([place]), Mearl Latin    . GERD 05/10/2007    Qualifier: Diagnosis of  By: Crystal [name] RMA, [place]    . HYPERLIPIDEMIA 05/10/2007    Qualifier: Diagnosis of  By: Crystal [name], [place]    . HYPERTENSION 05/10/2007    Qualifier: Diagnosis of  By: Crystal [name], [place]    . HYPOTENSION, ORTHOSTATIC 06/16/2010    Qualifier: Diagnosis of  By: Crystal Reichmann MD, Crystal [name]   . Irritable bowel syndrome 01/12/2009    Qualifier: Diagnosis of  By: Crystal [name] CMA ([facility]), Mearl Latin    . MELANOSIS COLI 02/16/2009    Qualifier: Diagnosis of  By: Crystal [name] CMA (AAMA), Mearl Latin    . PERIPHERAL VASCULAR DISEASE 06/17/2010    Qualifier: Diagnosis of  By: Crystal Reichmann MD, [place] 02/16/2009    Qualifier: Diagnosis of  By: Crystal [name] CMA ([place]), Mearl Latin    . STENOSIS, RECTAL 02/16/2009    Qualifier: Diagnosis of  By: Crystal [name] CMA ([place]), Mearl Latin    . SYNCOPE 04/19/2008    Qualifier: Diagnosis of  By: Crystal Hedges MD, Crystal [name]   . TACHYARRHYTHMIA 09/03/2009    Qualifier: Diagnosis of  By: Crystal Hedges MD, Crystal [name] ISCHEMIC ATTACK 03/02/2010    Qualifier: Diagnosis of  By: Crystal Hedges MD, Crystal [name] VENOUS INSUFFICIENCY,  LEGS 07/18/2010    Qualifier: Diagnosis of  By: Crystal Hedges MD, Crystal [name]   . Eosinophilic esophagitis   . Dysphagia   . Orthostatic hypotension   . Overactive detrusor   . Dizziness and giddiness   . Sinusitis acute   . Personal history of other diseases of digestive system   . Anal or rectal pain   . Other malaise and fatigue   . Adjustment disorder with depressed mood   . Routine general medical examination at a health care facility   . Breast cancer 03/11/12    right breast lumpectomy=invasive lobular ca,grade I/III,ER?PR=positive  . Radiation 04/21/2012-06/11/2012    64 gray right breast    PAST SURGICAL HISTORY: Past Surgical History  Procedure Laterality Date  . Appendectomy    . Abdominal hysterectomy    . Hemmorhoidectomy    . Cardiac catherization    . Esophageal tumor excised; posterior 1986    . Electrocardiogram  10/30/2006  . Edg  04/21/2007  . Breast surgery      left breast x 2  . Cardiac catheterization      FAMILY HISTORY Family History  Problem Relation Age of Onset  . Bone cancer Father   . Cancer Father     Bone  . Heart disease Brother   . Colon cancer Neg Hx   . Breast cancer Neg Hx   . Diabetes Neg Hx   . Anesthesia problems Neg Hx   The patient's father died from "bone cancer" at the age of 50. The patient's mother died in an automobile accident at the age of 63. The patient had one brother who died from a heart attack at the age of 51. The patient had no sisters.  GYNECOLOGIC HISTORY: Menarche age 13, she is Crystal [name] with first live birth at age 89. She underwent to the change of life approximately 1978. She did not take hormone replacement.  SOCIAL HISTORY: She used to work in [employer] but is now retired. Her husband died in a plane crash approximately 5 years ago. Her son, Crystal [name], 37, works in [employer] as an Clinical biochemist. The patient has no grandchildren. She lives alone with her cat Crystal [name].  ADVANCED DIRECTIVES: in place per Dr Crystal [name]'  note  HEALTH MAINTENANCE: History  Substance Use Topics  . Smoking status: Former Smoker    Quit date: 10/22/1965  . Smokeless tobacco: Never Used  . Alcohol Use: 1.8 oz/week    3 Glasses of wine per week     Comment: occasional      Colonoscopy: 2010  PAP: 2011  Bone density: 2011/ osteopenia  Lipid panel: UTD per Dr Crystal [name]  No Known Allergies  Current Outpatient Prescriptions  Medication Sig Dispense Refill  . amLODipine (NORVASC) 5 MG tablet TAKE ONE TABLET BY MOUTH ONE TIME DAILY  30 tablet  3  . aspirin 325 MG EC tablet Take 325 mg  by mouth daily.      . polyethylene glycol (MIRALAX / GLYCOLAX) packet Take 17 g by mouth 3 (three) times a week.      . Senna-Psyllium (PERDIEM PO) Take 2 tablets by mouth at bedtime.       No current facility-administered medications for this visit.    OBJECTIVE: Older white woman in no acute distress Filed Vitals:   11/17/13 1017  BP: 139/76  Pulse: 80  Temp: 97.6 F (36.4 C)  Resp: 18     Body mass index is 18.72 kg/(m^2).    ECOG FS:1 Filed Weights   11/17/13 1017  Weight: 102 lb 6.4 oz (46.448 kg)   Sclerae unicteric, pupils equal and round, good dentition Oropharynx clear No cervical or supraclavicular adenopathy Lungs no rales or rhonchi, good excursion bilaterally  Heart regular rate and rhythm. No murmur appreciated Abdomen soft,  nontender, positive bowel sounds MSK no focal spinal tenderness, mild kyphosis, no upper extremity lymphedema Neuro: nonfocal, well oriented, appropriate affect. Breasts: The right breast is status post lumpectomy and radiation. There is no evidence of disease recurrence. The right axilla is benign Left breast is unremarkable.    LAB RESULTS: Lab Results  Component Value Date   WBC 8.5 11/17/2013   NEUTROABS 6.9* 11/17/2013   HGB 12.6 11/17/2013   HCT 36.6 11/17/2013   MCV 93.2 11/17/2013   PLT 276 11/17/2013      Chemistry      Component Value Date/Time   NA 142 11/17/2013 0959   NA  138 06/19/2012 0450   K 4.0 11/17/2013 0959   K 3.6 06/19/2012 0450   CL 105 03/25/2013 0925   CL 100 06/19/2012 0450   CO2 28 11/17/2013 0959   CO2 28 06/19/2012 0450   BUN 14.3 11/17/2013 0959   BUN 9 06/19/2012 0450   CREATININE 0.8 11/17/2013 0959   CREATININE 0.57 06/19/2012 0450      Component Value Date/Time   CALCIUM 9.8 11/17/2013 0959   CALCIUM 10.7* 06/19/2012 0450   ALKPHOS 91 11/17/2013 0959   ALKPHOS 232* 06/19/2012 0450   AST 20 11/17/2013 0959   AST 834* 06/19/2012 0450   ALT 14 11/17/2013 0959   ALT 443* 06/19/2012 0450   BILITOT 0.85 11/17/2013 0959   BILITOT 1.4* 06/19/2012 0450      Lab Results  Component Value Date   LABCA2 10 02/06/2012    STUDIES: Mm Diag Breast Tomo Bilateral  11/05/2013   CLINICAL DATA:  68 year old female with history of right breast invasive lobular carcinoma with a positive margin at the pectoralis muscle. The patient underwent lumpectomy and radiation therapy. The patient had 2 ultrasound-guided core biopsies of the 12:00 and 3:00 regions of the right breast on 11/26/2012 which showed dense stromal fibrosis and fat necrosis.  EXAM: DIGITAL DIAGNOSTIC BILATERAL MAMMOGRAM WITH 3D TOMO WITH CAD  COMPARISON:  With priors.  ACR Breast Density Category c: The breast tissue is heterogeneously dense, which may obscure small masses.  FINDINGS: There are stable lumpectomy changes in the right breast. Two biopsy clips are identified from the patient's previous ultrasound-guided core biopsies in the right breast. No suspicious mass or malignant type microcalcifications identified in either breast.  Mammographic images were processed with CAD.  IMPRESSION: No mammographic evidence of malignancy in either breast.  RECOMMENDATION: Bilateral diagnostic mammogram in 1 year is recommended.  The patient has a dense fibroglandular pattern with a personal history of breast cancer with a positive margin of the pectoralis muscle.  She is status post lumpectomy and radiation therapy.  Yearly screening MRI is recommended. This was discussed with the patient.  I have discussed the findings and recommendations with the patient. Results were also provided in writing at the conclusion of the visit.  BI-RADS CATEGORY  2: Benign Finding(s)   Electronically Signed   By: Lillia Mountain M.D.   On: 11/05/2013 11:06     ASSESSMENT: 68 y.o.  [place] woman status post right lumpectomy 03/11/2012 for a pT1c N0, stage IA, invasive lobular breast cancer, grade 1, with a broadly positive margin which according to surgery could not be cleared short of mastectomy. The tumor is strongly estrogen and progesterone receptor positive, with a low Mib-1 and no evidence of HER-2 amplification.  (0) The Oncotype recurrence score is 23, predicting a 15% chance of distant recurrence with adjuvant tamoxifen. The Adjuvant! Program predicts a risk of recurrence of 17%, dropping to 9% with aromatase inhibitors.  (1) completed adjuvant irradiation 06/11/2012  (2) started anastrozole 06/11/2012 and held since 06/19/2012 due to acute abdominal pain and elevated LFTs.    (3)  Started letrozole in October 2013, discontinued January 2014 with poor tolerance  (4) osteoporosis: yearly zolendronic acid, first dose 01/20/2013, tolerated well  (5) exemestane started mid April 2014, but tolerated poorly  (6) questionable lung lesions found to be stable to resolved by chest CT 07/13/2013--no further followup needed  (7) anxiety disorder  PLAN:  Crystal [name] has been unable to tolerate aromatase inhibitors. We cannot go to tamoxifen because of her history of transient ischemic attacks. Accordingly we are going to follow off treatment. At this point, thankfully, there are no symptoms to suggest recurrent disease.  She has dropped about 6 pounds in the last 2 ears. I have strongly recommended that she increase her caloric intake. I think it would help if she started a walking program as well. I gave her a [employer] pamphlet  for her to consider using the [facility].  Today she requested a Xanax prescription. I would be happy to fill that issue followed up with our suggestion that she meet with our counselor. At this point however she prefers not to do that. Accordingly I directed her to her primary care physician regarding the request for Xanax.  Otherwise she will see surgery (I believe Dr. Donne Hazel) in about 3 months, and see me again in 6 months. She will be 2 years out from her definitive surgery at that time. She knows to call for any problems that may develop before that visit.  Ashlynn Gunnels C    11/17/2013

## 2014-01-04 ENCOUNTER — Ambulatory Visit (INDEPENDENT_AMBULATORY_CARE_PROVIDER_SITE_OTHER): Payer: Medicare Other | Admitting: Internal Medicine

## 2014-01-04 ENCOUNTER — Encounter: Payer: Self-pay | Admitting: Internal Medicine

## 2014-01-04 VITALS — BP 150/80 | HR 87 | Temp 97.7°F | Wt 104.0 lb

## 2014-01-04 DIAGNOSIS — C50319 Malignant neoplasm of lower-inner quadrant of unspecified female breast: Secondary | ICD-10-CM

## 2014-01-04 DIAGNOSIS — I10 Essential (primary) hypertension: Secondary | ICD-10-CM

## 2014-01-04 DIAGNOSIS — C50311 Malignant neoplasm of lower-inner quadrant of right female breast: Secondary | ICD-10-CM

## 2014-01-04 DIAGNOSIS — E785 Hyperlipidemia, unspecified: Secondary | ICD-10-CM

## 2014-01-04 DIAGNOSIS — K219 Gastro-esophageal reflux disease without esophagitis: Secondary | ICD-10-CM

## 2014-01-04 NOTE — Progress Notes (Signed)
Pre visit review using our clinic review tool, if applicable. No additional management support is needed unless otherwise documented below in the visit note. 

## 2014-01-04 NOTE — Patient Instructions (Signed)
Good to see you and it is great that you are doing so well.   Sleep - sleep duration insomnia. The best treatment is sleep hygiene:  Sleep is a learned or unlearned behavior. 5 principles of sleep hygiene - 1) regular hour to retire and rise 7days/wk 2) no stimulants - caffeine, chocolat, alcohol, 3) regular exercise  - every afternoon  4) sleep sanctuary - a space that is right light, temperature, sound level, good bed where all you do is sleep. 5) No extinction behaviors, e.g. Laying in bed awake doing anything but sleeping. This means if you have a bad night - no naps, etc  For the absolute need to get a nights sleep it is fine to use every third night or so a sleep product: otc benadryl, e.g. Tylenol PM, etc; a prescription product like ambien 5 mg or sonata 5 mg or restoril 15 mg or halcion 0.25 mg.  You can send me a message through MyChart if you need a prescription.

## 2014-01-05 NOTE — Assessment & Plan Note (Signed)
Symptoms under good control w/o medication on a regular basis.

## 2014-01-05 NOTE — Progress Notes (Signed)
   Subjective:    Patient ID: Crystal [name], female    DOB: 07-09-1946, 68 y.o.   MRN: [id number]  HPI Crystal [name] presents for general follow up. She has been doing OK. She follows with Dr. Jana Hakim for her breast cancer, s/p lumpectomy. She is currently stable with last mammogram in July 14 - stable. Her multiple medical problems have been quiescent. She has had follow up labs at the [facility].   She is current with dental and eye care. She has lost some weight but has a healthy diet, which helps control her IBS.   PMH, FamHx and SocHx reviewed for any changes and relevance.  Current Outpatient Prescriptions on File Prior to Visit  Medication Sig Dispense Refill  . amLODipine (NORVASC) 5 MG tablet TAKE ONE TABLET BY MOUTH ONE TIME DAILY  30 tablet  3  . aspirin 325 MG EC tablet Take 325 mg by mouth daily.      . polyethylene glycol (MIRALAX / GLYCOLAX) packet Take 17 g by mouth 3 (three) times a week.      . Senna-Psyllium (PERDIEM PO) Take 2 tablets by mouth at bedtime.       No current facility-administered medications on file prior to visit.      Review of Systems Constitutional:  Negative for fever, chills, activity change and unexpected weight change.  HEENT:  Negative for hearing loss, ear pain, congestion, neck stiffness and postnasal drip. Negative for sore throat or swallowing problems. Negative for dental complaints.   Eyes: Negative for vision loss or change in visual acuity.  Respiratory: Negative for chest tightness and wheezing. Negative for DOE.   Cardiovascular: Negative for chest pain or palpitations. No decreased exercise tolerance Gastrointestinal: No change in bowel habit. No bloating or gas. No reflux or indigestion Genitourinary: Negative for urgency, frequency, flank pain and difficulty urinating.  Musculoskeletal: Negative for myalgias, back pain, arthralgias and gait problem.  Neurological: Negative for dizziness, tremors, weakness and headaches.    Hematological: Negative for adenopathy.  Psychiatric/Behavioral: Negative for behavioral problems and dysphoric mood.       Objective:   Physical Exam Filed Vitals:   01/04/14 1455  BP: 150/80  Pulse: 87  Temp: 97.7 F (36.5 C)   gen'l- slender well groomed woman in no distress HEENT- C&S clear Cor 2+ radial pulse, RRR Pul - normal respirations Breast exam deferred to oncology, surgery and mammography Abd - soft Neuro - A&O x 3, CN II-XII grossly intact, normal gait and balance.   Lipid panel pending      Assessment & Plan:

## 2014-01-05 NOTE — Assessment & Plan Note (Signed)
She had lumpectomy with not clear margins and was unable to tolerate aromatase inhibitors. Currently off treatment with close follow up. She recently saw Dr. Jana Hakim. Last mammogram May 04, 2013

## 2014-01-05 NOTE — Assessment & Plan Note (Signed)
BP Readings from Last 3 Encounters:  01/04/14 150/80  11/17/13 139/76  09/24/13 134/80   Generally well controlled on her current regimen of low dose CCB  Plan-continue present medications

## 2014-02-12 ENCOUNTER — Ambulatory Visit (INDEPENDENT_AMBULATORY_CARE_PROVIDER_SITE_OTHER): Payer: Medicare Other | Admitting: Family Medicine

## 2014-02-12 ENCOUNTER — Encounter: Payer: Self-pay | Admitting: Family Medicine

## 2014-02-12 VITALS — BP 158/82 | HR 87 | Temp 97.0°F | Ht 62.0 in | Wt 102.4 lb

## 2014-02-12 DIAGNOSIS — R000 Tachycardia, unspecified: Secondary | ICD-10-CM

## 2014-02-12 DIAGNOSIS — F411 Generalized anxiety disorder: Secondary | ICD-10-CM

## 2014-02-12 DIAGNOSIS — I739 Peripheral vascular disease, unspecified: Secondary | ICD-10-CM | POA: Diagnosis not present

## 2014-02-12 DIAGNOSIS — I10 Essential (primary) hypertension: Secondary | ICD-10-CM

## 2014-02-12 DIAGNOSIS — I679 Cerebrovascular disease, unspecified: Secondary | ICD-10-CM

## 2014-02-12 MED ORDER — ALPRAZOLAM 0.5 MG PO TABS: 0.5000 mg | ORAL_TABLET | Freq: Every evening | ORAL | Status: DC | PRN

## 2014-02-12 MED ORDER — AMLODIPINE BESYLATE 5 MG PO TABS: ORAL_TABLET | ORAL | Status: DC

## 2014-02-12 NOTE — Progress Notes (Signed)
Patient ID: Crystal [name], female   DOB: 1946-10-06, 68 y.o.   MRN: [id number] SUBJECTIVE: CC: Chief Complaint  Patient presents with  . New Evaluation    LAST SEEN HERE 2-3 YRS. STATES WANTS SOME "XANAX" AND SHE DID TALK TO DR [name] ABOUT THIS AND SHE STATES SHE WAS NEVER GIVEN A RX FOR XANAX BEEN SEEING DR [name] AND SAW HIM IN MARCH 2015  . Medication Refill    REFILL AMLODIPINE    HPI: Patient is here for follow up of hypertension: denies Headache;deniesChest Pain;denies weakness;denies Shortness of Breath or Orthopnea;denies Visual changes;denies palpitations;denies cough;denies pedal edema;denies symptoms of TIA or stroke; admits to Compliance with medications, except her PCP retired and ran out of medications..needs refill of amlodipine denies Problems with medications.  Also tried her family members xanax once during a panic /anxiety attack and it worked. Thinks she needs a few due to her life changes. 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  Past Medical History  Diagnosis Date  . Abdominal aortic ectasia 01/31/2010    Qualifier: Diagnosis of  By: [name] MD, [place] PAIN RIGHT UPPER QUADRANT 11/07/2010    Qualifier: Diagnosis of  By: Sharlett Iles MD FACG, [place] DISEASE 06/16/2010    Qualifier: Diagnosis of  By: [name] MD, [place] Oris   . CONSTIPATION 01/12/2009    Qualifier: Diagnosis of  By: [name] CMA ([place]), Mearl Latin    . Cough 08/15/2010    Qualifier: Diagnosis of  By: Sharlett Iles MD Byrd Hesselbach DETRUSOR, OVERACTIVE 03/02/2010    Qualifier: Diagnosis of  By: [name] MD, [place] STRICTURE 01/12/2009    Qualifier: Diagnosis of  By: [name] CMA ([place]), Mearl Latin    . EXTERNAL HEMORRHOIDS 01/12/2009    Qualifier: Diagnosis of  By: [name] CMA ([place]), Mearl Latin    . GERD 05/10/2007    Qualifier: Diagnosis of  By: [name] RMA, [place]    . HYPERLIPIDEMIA 05/10/2007   Qualifier: Diagnosis of  By: [name], [place]    . HYPERTENSION 05/10/2007    Qualifier: Diagnosis of  By: [name], [place]    . HYPOTENSION, ORTHOSTATIC 06/16/2010    Qualifier: Diagnosis of  By: [name] MD, [place] Oris   . Irritable bowel syndrome 01/12/2009    Qualifier: Diagnosis of  By: [name] CMA (AAMA), Mearl Latin    . MELANOSIS COLI 02/16/2009    Qualifier: Diagnosis of  By: [name] CMA (AAMA), Mearl Latin    . PERIPHERAL VASCULAR DISEASE 06/17/2010    Qualifier: Diagnosis of  By: [name] MD, [place] 02/16/2009    Qualifier: Diagnosis of  By: [name] CMA ([place]), Mearl Latin    . STENOSIS, RECTAL 02/16/2009    Qualifier: Diagnosis of  By: [name] CMA ([place]), Mearl Latin    . SYNCOPE 04/19/2008    Qualifier: Diagnosis of  By: [name] MD, [name]   . TACHYARRHYTHMIA 09/03/2009    Qualifier: Diagnosis of  By: [name] MD, [place] ISCHEMIC ATTACK 03/02/2010    Qualifier: Diagnosis of  By: [name] MD, [name] VENOUS INSUFFICIENCY, LEGS 07/18/2010    Qualifier: Diagnosis of  By: [name] MD, [name]   . Eosinophilic esophagitis   . Dysphagia   . Orthostatic hypotension   . Overactive detrusor   . Dizziness and giddiness   . Sinusitis acute   .  Personal history of other diseases of digestive system   . Anal or rectal pain   . Other malaise and fatigue   . Adjustment disorder with depressed mood   . Routine general medical examination at a health care facility   . Breast cancer 03/11/12    right breast lumpectomy=invasive lobular ca,grade I/III,ER?PR=positive  . Radiation 04/21/2012-06/11/2012    64 gray right breast   Past Surgical History  Procedure Laterality Date  . Appendectomy    . Abdominal hysterectomy    . Hemmorhoidectomy    . Cardiac catherization    . Esophageal tumor excised; posterior 1986    . Electrocardiogram  10/30/2006  . Edg  04/21/2007  . Breast surgery      left breast x 2  . Cardiac catheterization     History   Social History  . Marital Status:  Widowed    Spouse Name: N/A    Number of Children: N/A  . Years of Education: N/A   Occupational History  . Not on file.   Social History Main Topics  . Smoking status: Former Smoker    Quit date: 10/22/1965  . Smokeless tobacco: Never Used  . Alcohol Use: 1.8 oz/week    3 Glasses of wine per week     Comment: occasional   . Drug Use: No  . Sexual Activity:    Other Topics Concern  . Not on file   Social History Narrative   HSG   Married '71-  Widowed '08   1 son - '76; no grandchildren   Retired, Lives alone. I-ADL's   End of Life: No resuscitation, no mechanical ventilation, no futile heroic measures.   Patient is a former smoker, quit 40 years ago.   Alcohol use-yes occ wine   Daily caffeine use 2 cups coffee / day   Illicit drug use-no   Patient does not get regular exercise.   Family History  Problem Relation Age of Onset  . Bone cancer Father   . Cancer Father     Bone  . Heart disease Brother   . Colon cancer Neg Hx   . Breast cancer Neg Hx   . Diabetes Neg Hx   . Anesthesia problems Neg Hx    Current Outpatient Prescriptions on File Prior to Visit  Medication Sig Dispense Refill  . aspirin 325 MG EC tablet Take 325 mg by mouth daily.      . polyethylene glycol (MIRALAX / GLYCOLAX) packet Take 17 g by mouth 3 (three) times a week.      . Senna-Psyllium (PERDIEM PO) Take 2 tablets by mouth at bedtime.       No current facility-administered medications on file prior to visit.   No Known Allergies Immunization History  Administered Date(s) Administered  . Influenza Split 08/22/2011, 06/30/2012  . Influenza Whole 08/13/2008, 08/03/2009, 07/18/2010  . Influenza,inj,Quad PF,36+ Mos 07/28/2013  . Pneumococcal Conjugate-13 10/20/2013  . Pneumococcal Polysaccharide-23 08/13/2008  . Td 01/31/2010  . Tdap 08/22/2011   Prior to Admission medications   Medication Sig Start Date End Date Taking? Authorizing Provider  amLODipine (NORVASC) 5 MG tablet TAKE ONE  TABLET BY MOUTH ONE TIME DAILY 09/25/13  Yes Neena Rhymes, MD  aspirin 325 MG EC tablet Take 325 mg by mouth daily.   Yes Historical Provider, MD  polyethylene glycol (MIRALAX / GLYCOLAX) packet Take 17 g by mouth 3 (three) times a week.   Yes Historical Provider, MD  Senna-Psyllium (PERDIEM PO) Take 2  tablets by mouth at bedtime.   Yes Historical Provider, MD     ROS: As above in the HPI. All other systems are stable or negative.  OBJECTIVE: APPEARANCE:  Patient in no acute distress.The patient appeared well nourished and normally developed. Acyanotic. Waist: VITAL SIGNS:BP 158/82  Pulse 87  Temp(Src) 97 F (36.1 C) (Oral)  Ht 5\' 2"  (1.575 m)  Wt 102 lb 6.4 oz (46.448 kg)  BMI 18.72 kg/m2 Thin WF looks well Pressure of speech Stressed about life changes.  SKIN: warm and  Dry without overt rashes, tattoos and scars  HEAD and Neck: without JVD, Head and scalp: normal Eyes:No scleral icterus. Fundi normal, eye movements normal. Ears: Auricle normal, canal normal, Tympanic membranes normal, insufflation normal. Nose: normal Throat: normal Neck & thyroid: normal  CHEST & LUNGS: Chest wall: normal Lungs: Clear  CVS: Reveals the PMI to be normally located. Regular rhythm, First and Second Heart sounds are normal,  absence of murmurs, rubs or gallops. Peripheral vasculature: Radial pulses: normal Dorsal pedis pulses: normal Posterior pulses: normal  ABDOMEN:  Appearance: normal Benign, no organomegaly, no masses, no Abdominal Aortic enlargement. No Guarding , no rebound. No Bruits. Bowel sounds: normal  RECTAL: N/A GU: N/A  EXTREMETIES: nonedematous.  MUSCULOSKELETAL:  Spine: normal Joints: intact  NEUROLOGIC: oriented to time,place and person; nonfocal. Strength is normal Sensory is normal Reflexes are normal Cranial Nerves are normal.  ASSESSMENT: Anxiety state, unspecified - Plan: ALPRAZolam (XANAX) 0.5 MG tablet  PLAN: Book: What Happy People  know.recommended  Dash diet handout in the AVS No orders of the defined types were placed in this encounter.   Meds ordered this encounter  Medications  . amLODipine (NORVASC) 5 MG tablet    Sig: TAKE ONE TABLET BY MOUTH ONE TIME DAILY    Dispense:  30 tablet    Refill:  3  . ALPRAZolam (XANAX) 0.5 MG tablet    Sig: Take 1 tablet (0.5 mg total) by mouth at bedtime as needed for anxiety. Or panic    Dispense:  15 tablet    Refill:  0   Medications Discontinued During This Encounter  Medication Reason  . amLODipine (NORVASC) 5 MG tablet Reorder   Return in about 4 weeks (around 03/12/2014) for recheck BP. with a new provider.  Stress reduction. Healthy lifestyle.   Thelma Viana P. Jacelyn Grip, M.D.

## 2014-02-12 NOTE — Patient Instructions (Signed)
DASH Diet The DASH diet stands for "Dietary Approaches to Stop Hypertension." It is a healthy eating plan that has been shown to reduce high blood pressure (hypertension) in as little as 14 days, while also possibly providing other significant health benefits. These other health benefits include reducing the risk of breast cancer after menopause and reducing the risk of type 2 diabetes, heart disease, colon cancer, and stroke. Health benefits also include weight loss and slowing kidney failure in patients with chronic kidney disease.  DIET GUIDELINES  Limit salt (sodium). Your diet should contain less than 1500 mg of sodium daily.  Limit refined or processed carbohydrates. Your diet should include mostly whole grains. Desserts and added sugars should be used sparingly.  Include small amounts of heart-healthy fats. These types of fats include nuts, oils, and tub margarine. Limit saturated and trans fats. These fats have been shown to be harmful in the body. CHOOSING FOODS  The following food groups are based on a 2000 calorie diet. See your Registered Dietitian for individual calorie needs. Grains and Grain Products (6 to 8 servings daily)  Eat More Often: Whole-wheat bread, brown rice, whole-grain or wheat pasta, quinoa, popcorn without added fat or salt (air popped).  Eat Less Often: White bread, white pasta, white rice, cornbread. Vegetables (4 to 5 servings daily)  Eat More Often: Fresh, frozen, and canned vegetables. Vegetables may be raw, steamed, roasted, or grilled with a minimal amount of fat.  Eat Less Often/Avoid: Creamed or fried vegetables. Vegetables in a cheese sauce. Fruit (4 to 5 servings daily)  Eat More Often: All fresh, canned (in natural juice), or frozen fruits. Dried fruits without added sugar. One hundred percent fruit juice ( cup [237 mL] daily).  Eat Less Often: Dried fruits with added sugar. Canned fruit in light or heavy syrup. [employer], Fish, and Poultry (2  servings or less daily. One serving is 3 to 4 oz [85-114 g]).  Eat More Often: Ninety percent or leaner ground beef, tenderloin, sirloin. Round cuts of beef, chicken breast, [place] breast. All fish. Grill, bake, or broil your meat. Nothing should be fried.  Eat Less Often/Avoid: Fatty cuts of meat, [place], or chicken leg, thigh, or wing. Fried cuts of meat or fish. Dairy (2 to 3 servings)  Eat More Often: Low-fat or fat-free milk, low-fat plain or light yogurt, reduced-fat or part-skim cheese.  Eat Less Often/Avoid: Milk (whole, 2%).Whole milk yogurt. Full-fat cheeses. Nuts, Seeds, and Legumes (4 to 5 servings per week)  Eat More Often: All without added salt.  Eat Less Often/Avoid: Salted nuts and seeds, canned beans with added salt. Fats and Sweets (limited)  Eat More Often: Vegetable oils, tub margarines without trans fats, sugar-free gelatin. Mayonnaise and salad dressings.  Eat Less Often/Avoid: Coconut oils, palm oils, butter, stick margarine, cream, half and half, cookies, candy, pie. FOR MORE INFORMATION The Dash Diet Eating Plan: www.dashdiet.org Document Released: 09/27/2011 Document Revised: 12/31/2011 Document Reviewed: 09/27/2011 [facility] Patient Information 2014 [place], [place].   What happy people know.

## 2014-03-12 DIAGNOSIS — J019 Acute sinusitis, unspecified: Secondary | ICD-10-CM | POA: Diagnosis not present

## 2014-03-19 DIAGNOSIS — H18419 Arcus senilis, unspecified eye: Secondary | ICD-10-CM | POA: Diagnosis not present

## 2014-03-19 DIAGNOSIS — H2589 Other age-related cataract: Secondary | ICD-10-CM | POA: Diagnosis not present

## 2014-03-23 DIAGNOSIS — H02839 Dermatochalasis of unspecified eye, unspecified eyelid: Secondary | ICD-10-CM | POA: Diagnosis not present

## 2014-03-23 DIAGNOSIS — H2510 Age-related nuclear cataract, unspecified eye: Secondary | ICD-10-CM | POA: Diagnosis not present

## 2014-03-23 DIAGNOSIS — I10 Essential (primary) hypertension: Secondary | ICD-10-CM | POA: Diagnosis not present

## 2014-03-23 DIAGNOSIS — H18419 Arcus senilis, unspecified eye: Secondary | ICD-10-CM | POA: Diagnosis not present

## 2014-03-25 ENCOUNTER — Ambulatory Visit (INDEPENDENT_AMBULATORY_CARE_PROVIDER_SITE_OTHER): Payer: Medicare Other | Admitting: Physician Assistant

## 2014-03-25 ENCOUNTER — Encounter: Payer: Self-pay | Admitting: Physician Assistant

## 2014-03-25 VITALS — BP 138/72 | HR 72 | Temp 98.1°F | Resp 16 | Ht 62.0 in | Wt 102.0 lb

## 2014-03-25 DIAGNOSIS — I739 Peripheral vascular disease, unspecified: Secondary | ICD-10-CM

## 2014-03-25 DIAGNOSIS — E538 Deficiency of other specified B group vitamins: Secondary | ICD-10-CM

## 2014-03-25 DIAGNOSIS — N3000 Acute cystitis without hematuria: Secondary | ICD-10-CM

## 2014-03-25 DIAGNOSIS — Z79899 Other long term (current) drug therapy: Secondary | ICD-10-CM | POA: Diagnosis not present

## 2014-03-25 DIAGNOSIS — Z0000 Encounter for general adult medical examination without abnormal findings: Secondary | ICD-10-CM | POA: Diagnosis not present

## 2014-03-25 DIAGNOSIS — E785 Hyperlipidemia, unspecified: Secondary | ICD-10-CM | POA: Diagnosis not present

## 2014-03-25 DIAGNOSIS — Z789 Other specified health status: Secondary | ICD-10-CM

## 2014-03-25 DIAGNOSIS — E559 Vitamin D deficiency, unspecified: Secondary | ICD-10-CM

## 2014-03-25 DIAGNOSIS — Z1331 Encounter for screening for depression: Secondary | ICD-10-CM

## 2014-03-25 DIAGNOSIS — I10 Essential (primary) hypertension: Secondary | ICD-10-CM | POA: Diagnosis not present

## 2014-03-25 DIAGNOSIS — R7309 Other abnormal glucose: Secondary | ICD-10-CM

## 2014-03-25 DIAGNOSIS — M810 Age-related osteoporosis without current pathological fracture: Secondary | ICD-10-CM

## 2014-03-25 LAB — CBC WITH DIFFERENTIAL/PLATELET
Basophils Absolute: 0.1 10*3/uL (ref 0.0–0.1)
Basophils Relative: 1 % (ref 0–1)
Eosinophils Absolute: 0.2 10*3/uL (ref 0.0–0.7)
Eosinophils Relative: 2 % (ref 0–5)
HCT: 37.7 % (ref 36.0–46.0)
HEMOGLOBIN: 13 g/dL (ref 12.0–15.0)
LYMPHS ABS: 1 10*3/uL (ref 0.7–4.0)
LYMPHS PCT: 13 % (ref 12–46)
MCH: 31.6 pg (ref 26.0–34.0)
MCHC: 34.5 g/dL (ref 30.0–36.0)
MCV: 91.7 fL (ref 78.0–100.0)
Monocytes Absolute: 0.5 10*3/uL (ref 0.1–1.0)
Monocytes Relative: 6 % (ref 3–12)
NEUTROS ABS: 6.2 10*3/uL (ref 1.7–7.7)
NEUTROS PCT: 78 % — AB (ref 43–77)
Platelets: 280 10*3/uL (ref 150–400)
RBC: 4.11 MIL/uL (ref 3.87–5.11)
RDW: 12.5 % (ref 11.5–15.5)
WBC: 7.9 10*3/uL (ref 4.0–10.5)

## 2014-03-25 LAB — HEPATIC FUNCTION PANEL
ALT: 11 U/L (ref 0–35)
AST: 21 U/L (ref 0–37)
Albumin: 4.5 g/dL (ref 3.5–5.2)
Alkaline Phosphatase: 79 U/L (ref 39–117)
BILIRUBIN DIRECT: 0.2 mg/dL (ref 0.0–0.3)
BILIRUBIN INDIRECT: 0.7 mg/dL (ref 0.2–1.2)
Total Bilirubin: 0.9 mg/dL (ref 0.2–1.2)
Total Protein: 7 g/dL (ref 6.0–8.3)

## 2014-03-25 LAB — BASIC METABOLIC PANEL WITH GFR
BUN: 15 mg/dL (ref 6–23)
CALCIUM: 9.4 mg/dL (ref 8.4–10.5)
CO2: 27 meq/L (ref 19–32)
CREATININE: 0.69 mg/dL (ref 0.50–1.10)
Chloride: 103 mEq/L (ref 96–112)
GFR, Est African American: >89 mL/min
GFR, Est Non African American: 88 mL/min
Glucose, Bld: 85 mg/dL (ref 70–99)
Potassium: 3.7 mEq/L (ref 3.5–5.3)
Sodium: 142 mEq/L (ref 135–145)

## 2014-03-25 LAB — LIPID PANEL
Cholesterol: 215 mg/dL — ABNORMAL HIGH (ref 0–200)
HDL: 60 mg/dL (ref >39)
LDL Cholesterol: 138 mg/dL — ABNORMAL HIGH (ref 0–99)
Total CHOL/HDL Ratio: 3.6 Ratio
Triglycerides: 86 mg/dL (ref <150)
VLDL: 17 mg/dL (ref 0–40)

## 2014-03-25 LAB — HEMOGLOBIN A1C
HEMOGLOBIN A1C: 5.5 % (ref <5.7)
Mean Plasma Glucose: 111 mg/dL (ref <117)

## 2014-03-25 LAB — MAGNESIUM: Magnesium: 2 mg/dL (ref 1.5–2.5)

## 2014-03-25 MED ORDER — CLOTRIMAZOLE-BETAMETHASONE 1-0.05 % EX CREA: 1.0000 "application " | TOPICAL_CREAM | Freq: Two times a day (BID) | CUTANEOUS | Status: DC

## 2014-03-25 MED ORDER — MIRABEGRON ER 50 MG PO TB24: 50.0000 mg | ORAL_TABLET | Freq: Every day | ORAL | Status: DC

## 2014-03-25 NOTE — Progress Notes (Signed)
MEDICARE ANNUAL WELLNESS VISIT AND CPE  Assessment:   1. HYPERTENSION - CBC with Differential - BASIC METABOLIC PANEL WITH GFR - Hepatic function panel - TSH - Urinalysis, Routine w reflex microscopic - Microalbumin / creatinine urine ratio - EKG 12-Lead - [place], RETROPERITNL ABD,  LTD  2. PERIPHERAL VASCULAR DISEASE Elevate legs, compression stockings  3. HYPERLIPIDEMIA - Lipid panel  4. Insomnia-  -good sleep hygiene discussed, increase day time activity, try melatonin or benadryl if this does not help we will call in sleep medication.    5. Elevated glucose Check A1C - Hemoglobin A1c  6. Unspecified vitamin D deficiency  7. Acute cystitis - Urine culture  8. Encounter for long-term (current) use of other medications - Magnesium  9. OAB/trouble sleeping - bladder matters and myrebtriq samples -if this does not help her sleeping we will discuss sleeping medications  10. Osteoporosis, unspecified Will start on fosamax once daily, samples given and will call in, counseled on taking with water and sitting up - DG Bone Density; Future  11. Tinea corpus Clob/bet cream given  Plan:   During the course of the visit the patient was educated and counseled about appropriate screening and preventive services including:    Pneumococcal vaccine   Influenza vaccine  Td vaccine  Screening electrocardiogram  Screening mammography  Bone densitometry screening  Colorectal cancer screening  Diabetes screening  Glaucoma screening  Nutrition counseling   Advanced directives: given information/requested  Screening recommendations, referrals:  Vaccinations: Tdap vaccine not indicated Influenza vaccine Due in Aug/Sept Pneumococcal vaccine not indicated Shingles vaccine not indicated Hep B vaccine not indicated  Nutrition assessed and recommended  Colonoscopy up to date Mammogram up to date, regular follow ups due to breast cancer Pap smear not  indicated Pelvic exam not indicated Recommended yearly ophthalmology/optometry visit for glaucoma screening and checkup Recommended yearly dental visit for hygiene and checkup Advanced directives - requested  Conditions/risks identified: BMI: Discussed weight loss, diet, and increase physical activity.  Increase physical activity: AHA recommends 150 minutes of physical activity a week.  Medications reviewed DEXA- requested- will [facility] with [facility] Urinary Incontinence is an issue: discussed non pharmacology and pharmacology options.  Fall risk: low- discussed PT, home fall assessment, medications.   Subjective:   Crystal [name] is a 68 y.o. female who presents for Medicare Annual Wellness Visit and complete physical. Her primary care doctor has retired in March so she is a new patient here today.     Date of last medicare wellness visit is unknown.  Her blood pressure has been controlled at home, today their BP is BP: 138/72 mmHg She does not workout. She denies chest pain, shortness of breath, dizziness.  She is not on cholesterol medication and denies myalgias. Her cholesterol is not at goal. The cholesterol last visit was:   Lab Results  Component Value Date   CHOL 238* 06/30/2012   HDL 74.60 06/30/2012   LDLDIRECT 147.6 06/30/2012   TRIG 87.0 06/30/2012   CHOLHDL 3 06/30/2012   Patient is not on Vitamin D supplement.   She continues to see her Oncologist and get MRI breast yearly.  She states she gets to sleep fine, but she wakes up 2-3 times a night having to go to the bathroom and she does not feel well rested during the day. She does watch TV before bed but does have the room dark and cool.   Names of Other Physician/Practitioners you currently use: 1. [facility]-  here for primary care 2. Dr. Sabra Heck, eye doctor, last visit Tuesday, and getting cataracts removed in Aug 3.  Dr. Berdine Addison, dentist, last visit 3 months ago Patient Care Team: Chipper Herb, MD as PCP - General (Family Medicine)   Medication Review Current Outpatient Prescriptions on File Prior to Visit  Medication Sig Dispense Refill  . ALPRAZolam (XANAX) 0.5 MG tablet Take 1 tablet (0.5 mg total) by mouth at bedtime as needed for anxiety. Or panic  15 tablet  0  . amLODipine (NORVASC) 5 MG tablet TAKE ONE TABLET BY MOUTH ONE TIME DAILY  30 tablet  3  . aspirin 325 MG EC tablet Take 325 mg by mouth daily.      . polyethylene glycol (MIRALAX / GLYCOLAX) packet Take 17 g by mouth 3 (three) times a week.      . Senna-Psyllium (PERDIEM PO) Take 2 tablets by mouth at bedtime.       No current facility-administered medications on file prior to visit.    Current Problems (verified) Patient Active Problem List   Diagnosis Date Noted  . Breast cancer of lower-inner quadrant of right female breast 07/20/2013  . Routine health maintenance 01/08/2012  . ABDOMINAL PAIN RIGHT UPPER QUADRANT 11/07/2010  . COUGH 08/15/2010  . VENOUS INSUFFICIENCY, LEGS 07/18/2010  . PERIPHERAL VASCULAR DISEASE 06/17/2010  . CEREBROVASCULAR DISEASE 06/16/2010  . HYPOTENSION, ORTHOSTATIC 06/16/2010  . TRANSIENT ISCHEMIC ATTACK 03/02/2010  . DETRUSOR, OVERACTIVE 03/02/2010  . ABDOMINAL AORTIC ECTASIA 01/31/2010  . TACHYARRHYTHMIA 09/03/2009  . RECTAL FISSURE 02/16/2009  . STENOSIS, RECTAL 02/16/2009  . MELANOSIS COLI 02/16/2009  . EXTERNAL HEMORRHOIDS 01/12/2009  . ESOPHAGEAL STRICTURE 01/12/2009  . CONSTIPATION 01/12/2009  . IRRITABLE BOWEL SYNDROME 01/12/2009  . SYNCOPE 04/19/2008  . HYPERLIPIDEMIA 05/10/2007  . HYPERTENSION 05/10/2007  . GERD 05/10/2007    Screening Tests Health Maintenance  Topic Date Due  . Pneumococcal Polysaccharide Vaccine Age 65 And Over  11/12/2008  . Influenza Vaccine  05/22/2014  . Mammogram  11/06/2015  . Colonoscopy  01/18/2019  . Tetanus/tdap  06/04/2023  . Zostavax  Addressed    Immunization History  Administered Date(s) Administered  .  Influenza Split 08/22/2011, 06/30/2012  . Influenza Whole 08/13/2008, 08/03/2009, 07/18/2010  . Influenza,inj,Quad PF,36+ Mos 07/28/2013  . Pneumococcal Conjugate-13 10/20/2013  . Pneumococcal Polysaccharide-23 08/13/2008  . Td 01/31/2010  . Tdap 08/22/2011    Preventative care: Last colonoscopy: 2010 Last mammogram: 10/2013, getting every 6 months, getting breast MRI tomorrow Last pap smear/pelvic exam: remote   DEXA: 05/2013  Prior vaccinations: TD or Tdap: 2014  Influenza: 2014  Pneumococcal: 2009 Prevnar 13: 2014 Shingles/Zostavax: 12/2011   History reviewed: allergies, current medications, past family history, past medical history, past social history, past surgical history and problem list  Risk Factors: Osteoporosis: postmenopausal estrogen deficiency, dietary calcium and/or vitamin D deficiency and smoking history, low BMI.  History of fracture in the past year: no  Tobacco History  Substance Use Topics  . Smoking status: Former Smoker    Quit date: 10/22/1965  . Smokeless tobacco: Never Used  . Alcohol Use: 1.8 oz/week    3 Glasses of wine per week     Comment: occasional    She does not smoke.  Patient is a former smoker. Are there smokers in your home (other than you)?  No  Alcohol Current alcohol use: social drinker  Caffeine Current caffeine use: coffee 1 /day  Exercise  Current exercise: none  Nutrition/Diet Current diet: in general, a "  healthy" diet    Cardiac risk factors: advanced age (older than 2 for men, 92 for women), dyslipidemia, hypertension and sedentary lifestyle.  Depression Screen (Note: if answer to either of the following is "Yes", a more complete depression screening is indicated)   Q1: Over the past two weeks, have you felt down, depressed or hopeless? No  Q2: Over the past two weeks, have you felt little interest or pleasure in doing things? No  Have you lost interest or pleasure in daily life? No  Do you often feel  hopeless? No  Do you cry easily over simple problems? No  Activities of Daily Living In your present state of health, do you have any difficulty performing the following activities?:  Driving? No Managing money?  No Feeding yourself? No Getting from bed to chair? No Climbing a flight of stairs? No Preparing food and eating?: No Bathing or showering? No Getting dressed: No Getting to the toilet? No Using the toilet:No Moving around from place to place: No In the past year have you fallen or had a near fall?:No   Are you sexually active?  No  Do you have more than one partner?  No  Vision Difficulties: No  Hearing Difficulties: No Do you often ask people to speak up or repeat themselves? No Do you experience ringing or noises in your ears? No Do you have difficulty understanding soft or whispered voices? No  Cognition  Do you feel that you have a problem with memory?No  Do you often misplace items? No  Do you feel safe at home?  Yes  Advanced directives Does patient have a [place]? Yes Does patient have a Living Will? Yes   Objective:     Blood pressure 138/72, pulse 72, temperature 98.1 F (36.7 C), resp. rate 16, height 5\' 2"  (1.575 m), weight 102 lb (46.267 kg). Body mass index is 18.65 kg/(m^2).  General appearance: alert, no distress, WD/WN,  female Cognitive Testing  Alert? Yes  Normal Appearance?Yes  Oriented to person? Yes  Place? Yes   Time? Yes  Recall of three objects?  Yes  Can perform simple calculations? Yes  Displays appropriate judgment?Yes  Can read the correct time from a watch face?Yes  HEENT: normocephalic, sclerae anicteric, TMs pearly, nares patent, no discharge or erythema, pharynx normal Oral cavity: MMM, no lesions Neck: supple, no lymphadenopathy, no thyromegaly, no masses Heart: RRR, normal S1, S2, no murmurs Lungs: CTA bilaterally, no wheezes, rhonchi, or rales Abdomen: +bs, soft, non tender, non distended, no  masses, no hepatomegaly, no splenomegaly Musculoskeletal: nontender, no swelling, no obvious deformity Extremities: no edema, no cyanosis, no clubbing Pulses: 2+ symmetric, upper and lower extremities, normal cap refill Neurological: alert, oriented x 3, CN2-12 intact, strength normal upper extremities and lower extremities, sensation normal throughout, DTRs 2+ throughout, no cerebellar signs, gait normal Psychiatric: normal affect, behavior normal, pleasant  Skin: On left anterior medial leg, scaly erythematous annular lesion.  Breast: defer Gyn: defer  Rectal: defer  Medicare Attestation I have personally reviewed: The patient's medical and social history Their use of alcohol, tobacco or illicit drugs Their current medications and supplements The patient's functional ability including ADLs,fall risks, home safety risks, cognitive, and hearing and visual impairment Diet and physical activities Evidence for depression or mood disorders  The patient's weight, height, BMI, and visual acuity have been recorded in the chart.  I have made referrals, counseling, and provided education to the patient based on review of the  above and I have provided the patient with a written personalized care plan for preventive services.     Vicie Mutters, PA-C   03/25/2014

## 2014-03-25 NOTE — Patient Instructions (Addendum)
Preventative Care for Adults - Female      MAINTAIN REGULAR HEALTH EXAMS:  A routine yearly physical is a good way to check in with your primary care provider about your health and preventive screening. It is also an opportunity to share updates about your health and any concerns you have, and receive a thorough all-over exam.   Most health insurance companies pay for at least some preventative services.  Check with your health plan for specific coverages.  WHAT PREVENTATIVE SERVICES DO WOMEN NEED?  Adult women should have their weight and blood pressure checked regularly.   Women age 35 and older should have their cholesterol levels checked regularly.  Women should be screened for cervical cancer with a Pap smear and pelvic exam beginning at either age 21, or 3 years after they become sexually activity.    Breast cancer screening generally begins at age 40 with a mammogram and breast exam by your primary care provider.    Beginning at age 50 and continuing to age 75, women should be screened for colorectal cancer.  Certain people may need continued testing until age 85.  Updating vaccinations is part of preventative care.  Vaccinations help protect against diseases such as the flu.  Osteoporosis is a disease in which the bones lose minerals and strength as we age. Women ages 65 and over should discuss this with their caregivers, as should women after menopause who have other risk factors.  Lab tests are generally done as part of preventative care to screen for anemia and blood disorders, to screen for problems with the kidneys and liver, to screen for bladder problems, to check blood sugar, and to check your cholesterol level.  Preventative services generally include counseling about diet, exercise, avoiding tobacco, drugs, excessive alcohol consumption, and sexually transmitted infections.    GENERAL RECOMMENDATIONS FOR GOOD HEALTH:  Healthy diet:  Eat a variety of foods, including  fruit, vegetables, animal or vegetable protein, such as meat, fish, chicken, and eggs, or beans, lentils, tofu, and grains, such as rice.  Drink plenty of water daily.  Decrease saturated fat in the diet, avoid lots of red meat, processed foods, sweets, fast foods, and fried foods.  Exercise:  Aerobic exercise helps maintain good heart health. At least 30-40 minutes of moderate-intensity exercise is recommended. For example, a brisk walk that increases your heart rate and breathing. This should be done on most days of the week.   Find a type of exercise or a variety of exercises that you enjoy so that it becomes a part of your daily life.  Examples are running, walking, swimming, water aerobics, and biking.  For motivation and support, explore group exercise such as aerobic class, spin class, Zumba, Yoga,or  martial arts, etc.    Set exercise goals for yourself, such as a certain weight goal, walk or run in a race such as a 5k walk/run.  Speak to your primary care provider about exercise goals.  Disease prevention:  If you smoke or chew tobacco, find out from your caregiver how to quit. It can literally save your life, no matter how long you have been a tobacco user. If you do not use tobacco, never begin.   Maintain a healthy diet and normal weight. Increased weight leads to problems with blood pressure and diabetes.   The Body Mass Index or BMI is a way of measuring how much of your body is fat. Having a BMI above 27 increases the risk of heart disease,   diabetes, hypertension, stroke and other problems related to obesity. Your caregiver can help determine your BMI and based on it develop an exercise and dietary program to help you achieve or maintain this important measurement at a healthful level.  High blood pressure causes heart and blood vessel problems.  Persistent high blood pressure should be treated with medicine if weight loss and exercise do not work.   Fat and cholesterol leaves  deposits in your arteries that can block them. This causes heart disease and vessel disease elsewhere in your body.  If your cholesterol is found to be high, or if you have heart disease or certain other medical conditions, then you may need to have your cholesterol monitored frequently and be treated with medication.   Ask if you should have a cardiac stress test if your history suggests this. A stress test is a test done on a treadmill that looks for heart disease. This test can find disease prior to there being a problem.  Menopause can be associated with physical symptoms and risks. Hormone replacement therapy is available to decrease these. You should talk to your caregiver about whether starting or continuing to take hormones is right for you.   Osteoporosis is a disease in which the bones lose minerals and strength as we age. This can result in serious bone fractures. Risk of osteoporosis can be identified using a bone density scan. Women ages 65 and over should discuss this with their caregivers, as should women after menopause who have other risk factors. Ask your caregiver whether you should be taking a calcium supplement and Vitamin D, to reduce the rate of osteoporosis.   Avoid drinking alcohol in excess (more than two drinks per day).  Avoid use of street drugs. Do not share needles with anyone. Ask for professional help if you need assistance or instructions on stopping the use of alcohol, cigarettes, and/or drugs.  Brush your teeth twice a day with fluoride toothpaste, and floss once a day. Good oral hygiene prevents tooth decay and gum disease. The problems can be painful, unattractive, and can cause other health problems. Visit your dentist for a routine oral and dental check up and preventive care every 6-12 months.   Look at your skin regularly.  Use a mirror to look at your back. Notify your caregivers of changes in moles, especially if there are changes in shapes, colors, a size  larger than a pencil eraser, an irregular border, or development of new moles.  Safety:  Use seatbelts 100% of the time, whether driving or as a passenger.  Use safety devices such as hearing protection if you work in environments with loud noise or significant background noise.  Use safety glasses when doing any work that could send debris in to the eyes.  Use a helmet if you ride a bike or motorcycle.  Use appropriate safety gear for contact sports.  Talk to your caregiver about gun safety.  Use sunscreen with a SPF (or skin protection factor) of 15 or greater.  Lighter skinned people are at a greater risk of skin cancer. Don't forget to also wear sunglasses in order to protect your eyes from too much damaging sunlight. Damaging sunlight can accelerate cataract formation.   Practice safe sex. Use condoms. Condoms are used for birth control and to help reduce the spread of sexually transmitted infections (or STIs).  Some of the STIs are gonorrhea (the clap), chlamydia, syphilis, trichomonas, herpes, HPV (human papilloma virus) and HIV (human immunodeficiency virus)   which causes AIDS. The herpes, HIV and HPV are viral illnesses that have no cure. These can result in disability, cancer and death.   Keep carbon monoxide and smoke detectors in your home functioning at all times. Change the batteries every 6 months or use a model that plugs into the wall.   Vaccinations:  Stay up to date with your tetanus shots and other required immunizations. You should have a booster for tetanus every 10 years. Be sure to get your flu shot every year, since 5%-20% of the [place] population comes down with the flu. The flu vaccine changes each year, so being vaccinated once is not enough. Get your shot in the fall, before the flu season peaks.   Other vaccines to consider:  Human Papilloma Virus or HPV causes cancer of the cervix, and other infections that can be transmitted from person to person. There is a vaccine for  HPV, and females should get immunized between the ages of 89 and 69. It requires a series of 3 shots.   Pneumococcal vaccine to protect against certain types of pneumonia.  This is normally recommended for adults age 55 or older.  However, adults younger than 70 years old with certain underlying conditions such as diabetes, heart or lung disease should also receive the vaccine.  Shingles vaccine to protect against Varicella Zoster if you are older than age 24, or younger than 70 years old with certain underlying illness.  Hepatitis A vaccine to protect against a form of infection of the liver by a virus acquired from food.  Hepatitis B vaccine to protect against a form of infection of the liver by a virus acquired from blood or body fluids, particularly if you work in health care.  If you plan to travel internationally, check with your local health department for specific vaccination recommendations.  Cancer Screening:  Breast cancer screening is essential to preventive care for women. All women age 70 and older should perform a breast self-exam every month. At age 87 and older, women should have their caregiver complete a breast exam each year. Women at ages 2 and older should have a mammogram (x-ray film) of the breasts. Your caregiver can discuss how often you need mammograms.    Cervical cancer screening includes taking a Pap smear (sample of cells examined under a microscope) from the cervix (end of the uterus). It also includes testing for HPV (Human Papilloma Virus, which can cause cervical cancer). Screening and a pelvic exam should begin at age 66, or 3 years after a woman becomes sexually active. Screening should occur every year, with a Pap smear but no HPV testing, up to age 86. After age 83, you should have a Pap smear every 3 years with HPV testing, if no HPV was found previously.   Most routine colon cancer screening begins at the age of 58. On a yearly basis, doctors may provide  special easy to use take-home tests to check for hidden blood in the stool. Sigmoidoscopy or colonoscopy can detect the earliest forms of colon cancer and is life saving. These tests use a small camera at the end of a tube to directly examine the colon. Speak to your caregiver about this at age 53, when routine screening begins (and is repeated every 5 years unless early forms of pre-cancerous polyps or small growths are found).    Alendronate tablets What is this medicine? ALENDRONATE (a LEN droe nate) slows calcium loss from bones. It helps to make normal healthy  bone and to slow bone loss in people with Paget's disease and osteoporosis. It may be used in others at risk for bone loss. This medicine may be used for other purposes; ask your health care provider or pharmacist if you have questions. COMMON BRAND NAME(S): Fosamax What should I tell my health care provider before I take this medicine? They need to know if you have any of these conditions: -dental disease -esophagus, stomach, or intestine problems, like acid reflux or GERD -kidney disease -low blood calcium -low vitamin D -problems sitting or standing 30 minutes -trouble swallowing -an unusual or allergic reaction to alendronate, other medicines, foods, dyes, or preservatives -pregnant or trying to get pregnant -breast-feeding How should I use this medicine? You must take this medicine exactly as directed or you will lower the amount of the medicine you absorb into your body or you may cause yourself harm. Take this medicine by mouth first thing in the morning, after you are up for the day. Do not eat or drink anything before you take your medicine. Swallow the tablet with a full glass (6 to 8 fluid ounces) of plain water. Do not take this medicine with any other drink. Do not chew or crush the tablet. After taking this medicine, do not eat breakfast, drink, or take any medicines or vitamins for at least 30 minutes. Sit or stand up  for at least 30 minutes after you take this medicine; do not lie down. Do not take your medicine more often than directed. Talk to your pediatrician regarding the use of this medicine in children. Special care may be needed. Overdosage: If you think you have taken too much of this medicine contact a poison control center or emergency room at once. NOTE: This medicine is only for you. Do not share this medicine with others. What if I miss a dose? If you miss a dose, do not take it later in the day. Continue your normal schedule starting the next morning. Do not take double or extra doses. What may interact with this medicine? -aluminum hydroxide -antacids -aspirin -calcium supplements -drugs for inflammation like ibuprofen, naproxen, and others -iron supplements -magnesium supplements -vitamins with minerals This list may not describe all possible interactions. Give your health care provider a list of all the medicines, herbs, non-prescription drugs, or dietary supplements you use. Also tell them if you smoke, drink alcohol, or use illegal drugs. Some items may interact with your medicine. What should I watch for while using this medicine? Visit your doctor or health care professional for regular checks ups. It may be some time before you see benefit from this medicine. Do not stop taking your medicine except on your doctor's advice. Your doctor or health care professional may order blood tests and other tests to see how you are doing. You should make sure you get enough calcium and vitamin D while you are taking this medicine, unless your doctor tells you not to. Discuss the foods you eat and the vitamins you take with your health care professional. Some people who take this medicine have severe bone, joint, and/or muscle pain. This medicine may also increase your risk for a broken thigh bone. Tell your doctor right away if you have pain in your upper leg or groin. Tell your doctor if you have any  pain that does not go away or that gets worse. This medicine can make you more sensitive to the sun. If you get a rash while taking this medicine, sunlight may  cause the rash to get worse. Keep out of the sun. If you cannot avoid being in the sun, wear protective clothing and use sunscreen. Do not use sun lamps or tanning beds/booths. What side effects may I notice from receiving this medicine? Side effects that you should report to your doctor or health care professional as soon as possible: -allergic reactions like skin rash, itching or hives, swelling of the face, lips, or tongue -black or tarry stools -bone, muscle or joint pain -changes in vision -chest pain -heartburn or stomach pain -jaw pain, especially after dental work -pain or trouble when swallowing -redness, blistering, peeling or loosening of the skin, including inside the mouth Side effects that usually do not require medical attention (report to your doctor or health care professional if they continue or are bothersome): -changes in taste -diarrhea or constipation -eye pain or itching -headache -nausea or vomiting -stomach gas or fullness This list may not describe all possible side effects. Call your doctor for medical advice about side effects. You may report side effects to FDA at 1-800-FDA-1088. Where should I keep my medicine? Keep out of the reach of children. Store at room temperature of 15 and 30 degrees C (59 and 86 degrees F). Throw away any unused medicine after the expiration date. NOTE: This sheet is a summary. It may not cover all possible information. If you have questions about this medicine, talk to your doctor, pharmacist, or health care provider.  2014, [employer]. (2011-04-06 08:56:09)

## 2014-03-26 ENCOUNTER — Ambulatory Visit
Admission: RE | Admit: 2014-03-26 | Discharge: 2014-03-26 | Disposition: A | Discharge status: Home / Self Care | Payer: Medicare Other | Source: Ambulatory Visit | Attending: General Surgery | Admitting: General Surgery

## 2014-03-26 DIAGNOSIS — D249 Benign neoplasm of unspecified breast: Secondary | ICD-10-CM | POA: Diagnosis not present

## 2014-03-26 DIAGNOSIS — Z853 Personal history of malignant neoplasm of breast: Secondary | ICD-10-CM

## 2014-03-26 LAB — URINALYSIS, ROUTINE W REFLEX MICROSCOPIC
BILIRUBIN URINE: NEGATIVE
Glucose, UA: NEGATIVE mg/dL
Hgb urine dipstick: NEGATIVE
Ketones, ur: NEGATIVE mg/dL
LEUKOCYTES UA: NEGATIVE
Nitrite: NEGATIVE
Protein, ur: NEGATIVE mg/dL
SPECIFIC GRAVITY, URINE: 1.01 (ref 1.005–1.030)
Urobilinogen, UA: 0.2 mg/dL (ref 0.0–1.0)
pH: 6 (ref 5.0–8.0)

## 2014-03-26 LAB — MICROALBUMIN / CREATININE URINE RATIO
CREATININE, URINE: 39 mg/dL
MICROALB UR: 0.5 mg/dL (ref 0.00–1.89)
Microalb Creat Ratio: 12.8 mg/g (ref 0.0–30.0)

## 2014-03-26 LAB — URINE CULTURE
Colony Count: NO GROWTH
Organism ID, Bacteria: NO GROWTH

## 2014-03-26 LAB — TSH: TSH: 1.92 u[IU]/mL (ref 0.350–4.500)

## 2014-03-26 MED ORDER — GADOBENATE DIMEGLUMINE 529 MG/ML IV SOLN
10.0000 mL | Freq: Once | INTRAVENOUS | Status: AC | PRN
  Administered 2014-03-26: 10 mL via INTRAVENOUS

## 2014-03-30 ENCOUNTER — Telehealth (INDEPENDENT_AMBULATORY_CARE_PROVIDER_SITE_OTHER): Payer: Self-pay

## 2014-03-30 NOTE — Telephone Encounter (Signed)
Called pt to notify her that the breast mri results are ok with no malignancy or tumor recurrence per DR Donne Hazel. The pt has an appt with Dr Donne Hazel on 7/7 to meet with [place] for the first time since Dr Lilyan Punt is no longer with [place] in the group. The pt understands and appreciates the call.

## 2014-04-06 ENCOUNTER — Telehealth: Payer: Self-pay | Admitting: *Deleted

## 2014-04-06 NOTE — Telephone Encounter (Signed)
Spoke with patient and changed providers to Dr. Grayland Ormond.  Patient of time and date.

## 2014-04-13 ENCOUNTER — Telehealth: Payer: Self-pay | Admitting: Oncology

## 2014-04-13 NOTE — Telephone Encounter (Signed)
, °

## 2014-04-27 ENCOUNTER — Encounter (INDEPENDENT_AMBULATORY_CARE_PROVIDER_SITE_OTHER): Payer: Self-pay | Admitting: General Surgery

## 2014-04-27 ENCOUNTER — Ambulatory Visit (INDEPENDENT_AMBULATORY_CARE_PROVIDER_SITE_OTHER): Payer: Medicare Other | Admitting: General Surgery

## 2014-04-27 VITALS — BP 122/78 | HR 80 | Temp 98.0°F | Ht 62.0 in | Wt 101.0 lb

## 2014-04-27 DIAGNOSIS — C50311 Malignant neoplasm of lower-inner quadrant of right female breast: Secondary | ICD-10-CM

## 2014-04-27 DIAGNOSIS — C50319 Malignant neoplasm of lower-inner quadrant of unspecified female breast: Secondary | ICD-10-CM | POA: Diagnosis not present

## 2014-04-27 NOTE — Progress Notes (Signed)
Subjective:     Patient ID: Crystal [name], female   DOB: Dec 14, 1945, 68 y.o.   MRN: [id number]  HPI 1 yof who underwent right breast lumpectomy and sentinel node biopsy in 4/13.  Her pathology was 1.5 cm invasive lobular carcinoma that is er pos, pr pos, Ki is 14% and her2 not amplified. oncotype was 23.  Her posterior resection margin was broadly positive but reading the prior surgeons op report he did remove the pectoralis fascia in this region.  She has received radiotherapy now.  She has been unable to tolerate aromatase inhibitors and due to TIA Dr Jana Hakim does not want to give her tamoxifen. She is on observation now.  She is up to date with mm and underwent mri ordered by prior surgeon for I think what he deemed high recurrence risk.  The mr shows breast density c, nl left breast, no abnormal nodes and right breast with postlumpectomy changes, fa in right breast and no other concerns for tumor recurrence.  She also had previously multiple right lung nodules that have decreased in size and number since last scan.  She does state that she has some right hand swelling lately but is otherwise doing well.    Review of Systems EXAM:  BILATERAL BREAST MRI WITH AND WITHOUT CONTRAST  TECHNIQUE:  Multiplanar, multisequence MR images of both breasts were obtained  prior to and following the intravenous administration of 35ml of  MultiHance.  THREE-DIMENSIONAL MR IMAGE RENDERING ON INDEPENDENT WORKSTATION:  Three-dimensional MR images were rendered by post-processing of the  original MR data on an independent workstation. The  three-dimensional MR images were interpreted, and findings are  reported in the following complete MRI report for this study. Three  dimensional images were evaluated at the independent Dyna[employer]ad  workstation  COMPARISON: Previous exams, including the previous bilateral breast  MR dated 02/04/2012 and bilateral diagnostic mammogram dated  11/05/2013.  FINDINGS:  Breast  composition: c: Heterogeneous fibroglandular tissue  Background parenchymal enhancement: Mild  Right breast: Post lumpectomy changes. The previously demonstrated  10 mm circumscribed enhancing mass with nonenhancing septations in  the retroareolar region of the right breast is more posteriorly  located today following interval surgery. This currently measures 8  x 7 x 6 mm.  Left breast: No mass or abnormal enhancement.  Lymph nodes: No abnormal appearing lymph nodes.  Ancillary findings: Multiple small right lung nodules with an  interval overall decrease in size and number. The largest is on  inversion recovery image number 37, measuring 7 mm. These had also  demonstrated overall improvement on a CT dated 07/13/2013. Diffuse  anterior and lateral pleural thickening and enhancement on the right  and mild diffuse enhancement of the right anterior chest wall and  overlying tissues is demonstrated, new since the previous MR,  compatible with postradiation changes.  IMPRESSION:  1. No evidence of malignancy or tumor recurrence.  2. Postradiation changes involving the right chest wall and pleural.  3. Stable to slight decrease in size of a fibroadenoma in the  central right breast, posteriorly.  4. Interval decrease in size and number of multiple right lung  nodules.  RECOMMENDATION:  Bilateral diagnostic mammogram in 7 months.  BI-RADS CATEGORY 2: Benign.     Objective:   Physical Exam  Constitutional: She appears well-developed and well-nourished.  Neck: Neck supple.  Pulmonary/Chest: Right breast exhibits no inverted nipple, no mass, no nipple discharge, no skin change and no tenderness. Left breast exhibits no inverted nipple, no  mass, no nipple discharge, no skin change and no tenderness.    Lymphadenopathy:    She has no cervical adenopathy.    She has no axillary adenopathy.       Right: No supraclavicular adenopathy present.       Left: No supraclavicular adenopathy  present.       Assessment:     Stage I breast cancer     Plan:     She has no clinical evidence of recurrence.  I have referred her to see pt/lymphedema specialists as she has never seen them previously and she has had some right hand swelling.  She will continue yearly mm (i recommended tomosynthesis), monthly sbe and biannual clinical exam at least.  Her margin was positive but this was with fascia removed and she had no invasion of muscle.  She has also undergone radiotherapy. I don't think she needs continued mr to follow this and we can follow clinically and with mm.  She is agreeable to this.  It is unfortunate that she cannot take antiestrogens. Interestingly her oncotype was intermediate with her tumor. She will follow up with med onc and I will see 6 months after that.

## 2014-04-27 NOTE — Patient Instructions (Signed)

## 2014-05-04 ENCOUNTER — Ambulatory Visit: Payer: Medicare Other | Attending: General Surgery | Admitting: Physical Therapy

## 2014-05-04 DIAGNOSIS — R1310 Dysphagia, unspecified: Secondary | ICD-10-CM | POA: Diagnosis not present

## 2014-05-04 DIAGNOSIS — IMO0001 Reserved for inherently not codable concepts without codable children: Secondary | ICD-10-CM | POA: Insufficient documentation

## 2014-05-04 DIAGNOSIS — C50919 Malignant neoplasm of unspecified site of unspecified female breast: Secondary | ICD-10-CM | POA: Diagnosis not present

## 2014-05-04 DIAGNOSIS — I10 Essential (primary) hypertension: Secondary | ICD-10-CM | POA: Insufficient documentation

## 2014-05-04 DIAGNOSIS — R209 Unspecified disturbances of skin sensation: Secondary | ICD-10-CM | POA: Insufficient documentation

## 2014-05-04 DIAGNOSIS — Z8673 Personal history of transient ischemic attack (TIA), and cerebral infarction without residual deficits: Secondary | ICD-10-CM | POA: Insufficient documentation

## 2014-05-18 ENCOUNTER — Ambulatory Visit: Payer: Medicare Other | Admitting: Oncology

## 2014-05-18 ENCOUNTER — Other Ambulatory Visit: Payer: Medicare Other

## 2014-05-24 DIAGNOSIS — H2510 Age-related nuclear cataract, unspecified eye: Secondary | ICD-10-CM | POA: Diagnosis not present

## 2014-05-24 DIAGNOSIS — H269 Unspecified cataract: Secondary | ICD-10-CM | POA: Diagnosis not present

## 2014-06-07 DIAGNOSIS — H2510 Age-related nuclear cataract, unspecified eye: Secondary | ICD-10-CM | POA: Diagnosis not present

## 2014-06-07 DIAGNOSIS — H269 Unspecified cataract: Secondary | ICD-10-CM | POA: Diagnosis not present

## 2014-06-17 ENCOUNTER — Other Ambulatory Visit: Payer: Self-pay | Admitting: *Deleted

## 2014-06-17 MED ORDER — AMLODIPINE BESYLATE 5 MG PO TABS: ORAL_TABLET | ORAL | Status: DC

## 2014-06-23 ENCOUNTER — Other Ambulatory Visit: Payer: Self-pay | Admitting: Emergency Medicine

## 2014-06-23 DIAGNOSIS — C50311 Malignant neoplasm of lower-inner quadrant of right female breast: Secondary | ICD-10-CM

## 2014-06-24 ENCOUNTER — Ambulatory Visit (HOSPITAL_BASED_OUTPATIENT_CLINIC_OR_DEPARTMENT_OTHER): Payer: Medicare Other | Admitting: Oncology

## 2014-06-24 ENCOUNTER — Other Ambulatory Visit (HOSPITAL_BASED_OUTPATIENT_CLINIC_OR_DEPARTMENT_OTHER): Payer: Medicare Other

## 2014-06-24 ENCOUNTER — Telehealth: Payer: Self-pay | Admitting: Oncology

## 2014-06-24 VITALS — BP 131/73 | HR 86 | Temp 98.3°F | Resp 18 | Ht 62.0 in | Wt 100.1 lb

## 2014-06-24 DIAGNOSIS — M810 Age-related osteoporosis without current pathological fracture: Secondary | ICD-10-CM

## 2014-06-24 DIAGNOSIS — C50319 Malignant neoplasm of lower-inner quadrant of unspecified female breast: Secondary | ICD-10-CM

## 2014-06-24 DIAGNOSIS — F411 Generalized anxiety disorder: Secondary | ICD-10-CM | POA: Diagnosis not present

## 2014-06-24 DIAGNOSIS — Z170 Estrogen receptor positive status [ER+]: Secondary | ICD-10-CM

## 2014-06-24 DIAGNOSIS — C50311 Malignant neoplasm of lower-inner quadrant of right female breast: Secondary | ICD-10-CM

## 2014-06-24 LAB — CBC WITH DIFFERENTIAL/PLATELET
BASO%: 0.7 % (ref 0.0–2.0)
Basophils Absolute: 0.1 10*3/uL (ref 0.0–0.1)
EOS%: 0.9 % (ref 0.0–7.0)
Eosinophils Absolute: 0.1 10*3/uL (ref 0.0–0.5)
HEMATOCRIT: 39.1 % (ref 34.8–46.6)
HGB: 12.9 g/dL (ref 11.6–15.9)
LYMPH%: 8.9 % — ABNORMAL LOW (ref 14.0–49.7)
MCH: 31.1 pg (ref 25.1–34.0)
MCHC: 32.9 g/dL (ref 31.5–36.0)
MCV: 94.4 fL (ref 79.5–101.0)
MONO#: 0.5 10*3/uL (ref 0.1–0.9)
MONO%: 4.9 % (ref 0.0–14.0)
NEUT#: 8.4 10*3/uL — ABNORMAL HIGH (ref 1.5–6.5)
NEUT%: 84.6 % — AB (ref 38.4–76.8)
PLATELETS: 278 10*3/uL (ref 145–400)
RBC: 4.14 10*6/uL (ref 3.70–5.45)
RDW: 12.5 % (ref 11.2–14.5)
WBC: 9.9 10*3/uL (ref 3.9–10.3)
lymph#: 0.9 10*3/uL (ref 0.9–3.3)

## 2014-06-24 LAB — COMPREHENSIVE METABOLIC PANEL (CC13)
ALK PHOS: 74 U/L (ref 40–150)
ALT: 20 U/L (ref 0–55)
AST: 23 U/L (ref 5–34)
Albumin: 4.1 g/dL (ref 3.5–5.0)
Anion Gap: 9 mEq/L (ref 3–11)
BUN: 17.5 mg/dL (ref 7.0–26.0)
CO2: 29 mEq/L (ref 22–29)
CREATININE: 0.8 mg/dL (ref 0.6–1.1)
Calcium: 9.9 mg/dL (ref 8.4–10.4)
Chloride: 106 mEq/L (ref 98–109)
GLUCOSE: 100 mg/dL (ref 70–140)
Potassium: 3.9 mEq/L (ref 3.5–5.1)
SODIUM: 144 meq/L (ref 136–145)
TOTAL PROTEIN: 7 g/dL (ref 6.4–8.3)
Total Bilirubin: 0.83 mg/dL (ref 0.20–1.20)

## 2014-06-24 NOTE — Progress Notes (Signed)
ID: Crystal [name]   DOB: Jul 05, 1946  MR#: [id number]  KXF#:[id number]  PCP: Crystal Gainer, [name] SU: Crystal [name]  CHIEF COMPLAINT: Estrogen receptor positive breast cancer  CURRENT TREATMENT: Observation   HISTORY OF PRESENT ILLNESS: From the original intake not:  The patient saw Crystal. Paula [name] for routine gynecologic followup and she noted a palpable mass in the right breast she said Crystal [name] up for right mammography and ultrasonography, performed at the breast center 01/29/2012. The breasts were heterogeneously dense. It mass with dense calcification was noted centrally in the right breast consistent with a benign fibroadenoma. The new palpable mass was not well demonstrated by mammography. It was firm, mobile, and measured approximately 1 cm by palpation. Ultrasound showed an irregularly marginated hypoechoic mass in this area, measuring 1.1 cm.  Biopsy was performed the same day, and showed [phone]) an invasive lobular breast cancer, e-cadherin negative,grade 1, estrogen and progesterone receptor positive, both of 100%, with an MIB-1 of 14%, and no HER-2 amplification.  The patient underwent a left diagnostic mammography 01/30/2012 and bilateral breast MRI 0415/2013.there were no other areas of concern in either breast, and there was no adenopathy noted. By MRI of the right breast mass measured 1.4 cm.   Her subsequent history is as detailed below.  INTERVAL HISTORY: Crystal [name] returns today for followup of her right breast cancer. 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 She says that completely turned her life around, and now she feels absolutely terrific.  REVIEW OF SYSTEMS: Crystal [name] walks about 3 times a week. Occasionally she has mild palpitations. She still has a poor appetite, a little anxiety, and bruises easily. She has no "worse problems" however. A detailed review of systems today was otherwise noncontributory  PAST MEDICAL  HISTORY: Past Medical History  Diagnosis Date  . Abdominal aortic ectasia 01/31/2010    Qualifier: Diagnosis of  By: Crystal Hedges [name], [place] DISEASE 06/16/2010    Qualifier: Diagnosis of  By: Crystal [name], Crystal [name]   . CONSTIPATION 01/12/2009    Qualifier: Diagnosis of  By: Crystal [name] CMA ([place]), Crystal [name]    . ESOPHAGEAL STRICTURE 01/12/2009    Qualifier: Diagnosis of  By: Crystal [name] CMA ([place]), Crystal [name]    . EXTERNAL HEMORRHOIDS 01/12/2009    Qualifier: Diagnosis of  By: Crystal [name] CMA ([place]), Crystal [name]    . GERD 05/10/2007    Qualifier: Diagnosis of  By: [name] RMA, Crystal [name]    . HYPERLIPIDEMIA 05/10/2007    Qualifier: Diagnosis of  By: Crystal [name], Crystal [name]    . HYPERTENSION 05/10/2007    Qualifier: Diagnosis of  By: Crystal [name], Crystal [name]    . Irritable bowel syndrome 01/12/2009    Qualifier: Diagnosis of  By: Crystal [name] CMA (AAMA), Crystal [name]    . PERIPHERAL VASCULAR DISEASE 06/17/2010    Qualifier: Diagnosis of  By: Crystal [name], [place] 02/16/2009    Qualifier: Diagnosis of  By: Crystal [name] CMA ([place]), Crystal [name]    . STENOSIS, RECTAL 02/16/2009    Qualifier: Diagnosis of  By: Crystal [name] CMA ([place]), Crystal [name]    . Tachyarrhythmia 09/03/2009    Qualifier: Diagnosis of  By: Crystal Hedges [name], [place] ISCHEMIC ATTACK 03/02/2010    Qualifier: Diagnosis of  By: Crystal Hedges [name], Crystal [name] VENOUS INSUFFICIENCY, LEGS 07/18/2010    Qualifier: Diagnosis of  By: Crystal Hedges [name], Crystal [name]   . Eosinophilic esophagitis   . Orthostatic hypotension   . Overactive  detrusor   . Adjustment disorder with depressed mood   . Breast cancer 03/11/12    right breast lumpectomy=invasive lobular ca,grade I/III,ER?PR=positive  . Radiation 04/21/2012-06/11/2012    64 gray right breast    PAST SURGICAL HISTORY: Past Surgical History  Procedure Laterality Date  . Appendectomy    . Abdominal hysterectomy    . Hemmorhoidectomy    . Cardiac catherization    . Esophageal tumor excised; posterior 1986    . Electrocardiogram  10/30/2006  . Edg   04/21/2007  . Breast surgery      left breast x 2  . Cardiac catheterization      FAMILY HISTORY Family History  Problem Relation Age of Onset  . Bone cancer Father   . Cancer Father     Bone  . Heart disease Brother   . Colon cancer Neg Hx   . Breast cancer Neg Hx   . Diabetes Neg Hx   . Anesthesia problems Neg Hx   The patient's father died from "bone cancer" at the age of 49. The patient's mother died in an automobile accident at the age of 38. The patient had one brother who died from a heart attack at the age of 59. The patient had no sisters.  GYNECOLOGIC HISTORY: Menarche age 35, she is [place] with first live birth at age 28. She underwent to the change of life approximately 1978. She did not take hormone replacement.  SOCIAL HISTORY: She used to work in [employer] but is now retired. Her husband died in a plane crash approximately 5 years ago. Her son, Crystal [name], 37, works in [name] as an Clinical biochemist. The patient has no grandchildren. She lives alone with her cat Crystal [name].  ADVANCED DIRECTIVES: in place per Crystal [name]' note  HEALTH MAINTENANCE: History  Substance Use Topics  . Smoking status: Former Smoker    Quit date: 10/22/1965  . Smokeless tobacco: Never Used  . Alcohol Use: 1.8 oz/week    3 Glasses of wine per week     Comment: occasional      Colonoscopy: 2010  PAP: 2011  Bone density: 2011/ osteopenia  Lipid panel: UTD per Crystal [name]  No Known Allergies  Current Outpatient Prescriptions  Medication Sig Dispense Refill  . ALPRAZolam (XANAX) 0.5 MG tablet Take 1 tablet (0.5 mg total) by mouth at bedtime as needed for anxiety. Or panic  15 tablet  0  . amLODipine (NORVASC) 5 MG tablet TAKE ONE TABLET BY MOUTH ONE TIME DAILY  30 tablet  2  . aspirin 325 MG EC tablet Take 325 mg by mouth daily.      . clotrimazole-betamethasone (LOTRISONE) cream Apply 1 application topically 2 (two) times daily.  15 g  2  . mirabegron ER (MYRBETRIQ) 50 MG TB24 tablet  Take 1 tablet (50 mg total) by mouth daily.  30 tablet  0  . polyethylene glycol (MIRALAX / GLYCOLAX) packet Take 17 g by mouth 3 (three) times a week.      . Senna-Psyllium (PERDIEM PO) Take 2 tablets by mouth at bedtime.       No current facility-administered medications for this visit.    OBJECTIVE: Older white woman who appears in good health Filed Vitals:   06/24/14 1015  BP: 131/73  Pulse: 86  Temp: 98.3 F (36.8 C)  Resp: 18     Body mass index is 18.3 kg/(m^2).    ECOG FS:0 Filed Weights   06/24/14 1015  Weight: 100 lb  1.6 oz (45.405 kg)   Sclerae unicteric, EOMs intact Oropharynx clear, slightly dry No cervical or supraclavicular adenopathy Lungs no rales or rhonchi Heart regular rate and rhythm Abdomen soft,  nontender, positive bowel sounds MSK no focal spinal tenderness, mild kyphosis, no upper extremity lymphedema Neuro: nonfocal, well oriented, positive affect. Breasts: The right breast is status post lumpectomy and radiation. There is no evidence of disease recurrence. The right axilla is benign. The left breast is unremarkable   LAB RESULTS: Lab Results  Component Value Date   WBC 9.9 06/24/2014   NEUTROABS 8.4* 06/24/2014   HGB 12.9 06/24/2014   HCT 39.1 06/24/2014   MCV 94.4 06/24/2014   PLT 278 06/24/2014      Chemistry      Component Value Date/Time   NA 142 03/25/2014 1050   NA 142 11/17/2013 0959   K 3.7 03/25/2014 1050   K 4.0 11/17/2013 0959   CL 103 03/25/2014 1050   CL 105 03/25/2013 0925   CO2 27 03/25/2014 1050   CO2 28 11/17/2013 0959   BUN 15 03/25/2014 1050   BUN 14.3 11/17/2013 0959   CREATININE 0.69 03/25/2014 1050   CREATININE 0.8 11/17/2013 0959   CREATININE 0.57 06/19/2012 0450      Component Value Date/Time   CALCIUM 9.4 03/25/2014 1050   CALCIUM 9.8 11/17/2013 0959   ALKPHOS 79 03/25/2014 1050   ALKPHOS 91 11/17/2013 0959   AST 21 03/25/2014 1050   AST 20 11/17/2013 0959   ALT 11 03/25/2014 1050   ALT 14 11/17/2013 0959   BILITOT 0.9 03/25/2014 1050    BILITOT 0.85 11/17/2013 0959      Lab Results  Component Value Date   LABCA2 10 02/06/2012    STUDIES: THREE-DIMENSIONAL MR IMAGE RENDERING ON INDEPENDENT WORKSTATION:  Three-dimensional MR images were rendered by post-processing of the  original MR data on an independent workstation. The  three-dimensional MR images were interpreted, and findings are  reported in the following complete MRI report for this study. Three  dimensional images were evaluated at the independent DynaCad  workstation  COMPARISON: Previous exams, including the previous bilateral breast  MR dated 02/04/2012 and bilateral diagnostic mammogram dated  11/05/2013.  FINDINGS:  Breast composition: c: Heterogeneous fibroglandular tissue  Background parenchymal enhancement: Mild  Right breast: Post lumpectomy changes. The previously demonstrated  10 mm circumscribed enhancing mass with nonenhancing septations in  the retroareolar region of the right breast is more posteriorly  located today following interval surgery. This currently measures 8  x 7 x 6 mm.  Left breast: No mass or abnormal enhancement.  Lymph nodes: No abnormal appearing lymph nodes.  Ancillary findings: Multiple small right lung nodules with an  interval overall decrease in size and number. The largest is on  inversion recovery image number 37, measuring 7 mm. These had also  demonstrated overall improvement on a CT dated 07/13/2013. Diffuse  anterior and lateral pleural thickening and enhancement on the right  and mild diffuse enhancement of the right anterior chest wall and  overlying tissues is demonstrated, new since the previous MR,  compatible with postradiation changes.  IMPRESSION:  1. No evidence of malignancy or tumor recurrence.  2. Postradiation changes involving the right chest wall and pleural.  3. Stable to slight decrease in size of a fibroadenoma in the  central right breast, posteriorly.  4. Interval decrease in size and  number of multiple right lung  nodules.  RECOMMENDATION:  Bilateral diagnostic mammogram in 7  months.  BI-RADS CATEGORY 2: Benign.  Electronically Signed  By: Enrique Sack M.D.  On: 03/26/2014 17:17   ASSESSMENT: 68 y.o.  [place] woman status post right lumpectomy 03/11/2012 for a pT1c N0, stage IA, invasive lobular breast cancer, grade 1, with a broadly positive margin which according to surgery could not be cleared short of mastectomy. The tumor is strongly estrogen and progesterone receptor positive, with a low Mib-1 and no evidence of HER-2 amplification.  (0) The Oncotype recurrence score is 23, predicting a 15% chance of distant recurrence with adjuvant tamoxifen. The Adjuvant! Program predicts a risk of recurrence of 17%, dropping to 9% with aromatase inhibitors.  (1) completed adjuvant irradiation 06/11/2012  (2) started anastrozole 06/11/2012 and held since 06/19/2012 due to acute abdominal pain and elevated LFTs.    (3)  Started letrozole in October 2013, discontinued January 2014 with poor tolerance  (4) osteoporosis: yearly zolendronic acid, first dose 01/20/2013, tolerated well  (5) exemestane started mid April 2014, but tolerated poorly  (6) questionable lung lesions found to be stable to resolved by chest CT 07/13/2013--no further followup needed  (7) anxiety disorder  (8) not a tamoxifen candidate given prior clotting history  PLAN:  Crystal [name] looks very healthy, is very satisfied not taking antiestrogen, and is much less anxious than before. She is exercising moderately and she could increase that a little bit, from 3 times a week to 5. She can also pick up a couple pounds. Overall though, she is doing terrific.  She will be seeing Crystal. Donne Hazel in February after her January mammogram. She will return to see [place] in May. From that point I will start seeing her on a once a year basis until she completes her 5 years of followup.  She knows to call for any problems that  may develop before next visit here.  Corabelle Spackman C    06/24/2014

## 2014-06-24 NOTE — Telephone Encounter (Signed)
per pof to sch pt appt-scg-gave pt copy of sch

## 2014-06-25 NOTE — Addendum Note (Signed)
Addended by: Laureen Abrahams on: 06/25/2014 05:46 PM   Modules accepted: Orders

## 2014-08-03 DIAGNOSIS — N3090 Cystitis, unspecified without hematuria: Secondary | ICD-10-CM | POA: Diagnosis not present

## 2014-08-03 DIAGNOSIS — R300 Dysuria: Secondary | ICD-10-CM | POA: Diagnosis not present

## 2014-08-13 ENCOUNTER — Ambulatory Visit: Payer: Medicare Other

## 2014-09-11 ENCOUNTER — Other Ambulatory Visit: Payer: Self-pay | Admitting: Family Medicine

## 2014-09-13 DIAGNOSIS — S63651A Sprain of metacarpophalangeal joint of left index finger, initial encounter: Secondary | ICD-10-CM | POA: Diagnosis not present

## 2014-09-13 NOTE — Telephone Encounter (Signed)
Refilled BP med per protocol.

## 2014-09-21 ENCOUNTER — Ambulatory Visit (INDEPENDENT_AMBULATORY_CARE_PROVIDER_SITE_OTHER): Payer: Medicare Other

## 2014-09-21 ENCOUNTER — Ambulatory Visit (INDEPENDENT_AMBULATORY_CARE_PROVIDER_SITE_OTHER): Payer: Medicare Other | Admitting: Family Medicine

## 2014-09-21 ENCOUNTER — Encounter: Payer: Self-pay | Admitting: Family Medicine

## 2014-09-21 VITALS — BP 128/66 | HR 90 | Temp 97.3°F | Ht 62.0 in | Wt 103.8 lb

## 2014-09-21 DIAGNOSIS — M79642 Pain in left hand: Secondary | ICD-10-CM

## 2014-09-21 NOTE — Progress Notes (Signed)
   Subjective:    Patient ID: Crystal [name], female    DOB: 1946/04/05, 68 y.o.   MRN: [id number]  HPI  Patient is here with c/o left hand discomfort.  She was seen at urgent care. Review of Systems  Constitutional: Negative for fever.  HENT: Negative for ear pain.   Eyes: Negative for discharge.  Respiratory: Negative for cough.   Cardiovascular: Negative for chest pain.  Gastrointestinal: Negative for abdominal distention.  Endocrine: Negative for polyuria.  Genitourinary: Negative for difficulty urinating.  Musculoskeletal: Negative for gait problem and neck pain.  Skin: Negative for color change and rash.  Neurological: Negative for speech difficulty and headaches.  Psychiatric/Behavioral: Negative for agitation.       Objective:    There were no vitals taken for this visit. Physical Exam  TTP left hand and eccymosis and swelling on top of left hand.  Xray left hand - No fracture Prelimnary reading by Iverson Alamin      Assessment & Plan:     ICD-9-CM ICD-10-CM   1. Left hand pain 729.5 M79.642 DG Hand Complete Left     No Follow-up on file.  Lysbeth Penner FNP

## 2014-09-24 ENCOUNTER — Other Ambulatory Visit: Payer: Self-pay | Admitting: Oncology

## 2014-09-24 ENCOUNTER — Other Ambulatory Visit (INDEPENDENT_AMBULATORY_CARE_PROVIDER_SITE_OTHER): Payer: Self-pay | Admitting: General Surgery

## 2014-09-24 DIAGNOSIS — Z853 Personal history of malignant neoplasm of breast: Secondary | ICD-10-CM

## 2014-11-04 ENCOUNTER — Other Ambulatory Visit: Payer: Self-pay | Admitting: General Surgery

## 2014-11-04 DIAGNOSIS — Z853 Personal history of malignant neoplasm of breast: Secondary | ICD-10-CM

## 2014-11-08 ENCOUNTER — Ambulatory Visit
Admission: RE | Admit: 2014-11-08 | Discharge: 2014-11-08 | Disposition: A | Discharge status: Home / Self Care | Payer: Medicare Other | Source: Ambulatory Visit | Attending: Oncology | Admitting: Oncology

## 2014-11-08 DIAGNOSIS — Z853 Personal history of malignant neoplasm of breast: Secondary | ICD-10-CM | POA: Diagnosis not present

## 2014-11-08 DIAGNOSIS — R922 Inconclusive mammogram: Secondary | ICD-10-CM | POA: Diagnosis not present

## 2014-11-18 DIAGNOSIS — K649 Unspecified hemorrhoids: Secondary | ICD-10-CM | POA: Diagnosis not present

## 2014-11-18 DIAGNOSIS — R197 Diarrhea, unspecified: Secondary | ICD-10-CM | POA: Diagnosis not present

## 2014-11-23 ENCOUNTER — Telehealth: Payer: Self-pay | Admitting: Family Medicine

## 2014-11-23 NOTE — Telephone Encounter (Signed)
NTBS.

## 2014-11-24 ENCOUNTER — Ambulatory Visit (INDEPENDENT_AMBULATORY_CARE_PROVIDER_SITE_OTHER): Payer: Medicare Other | Admitting: Family Medicine

## 2014-11-24 VITALS — BP 126/69 | HR 92 | Temp 97.5°F | Ht 62.0 in | Wt 101.2 lb

## 2014-11-24 DIAGNOSIS — K529 Noninfective gastroenteritis and colitis, unspecified: Secondary | ICD-10-CM

## 2014-11-24 LAB — POCT CBC
Granulocyte percent: 79.9 %G (ref 37–80)
HCT, POC: 35.9 % — AB (ref 37.7–47.9)
Hemoglobin: 11.6 g/dL — AB (ref 12.2–16.2)
Lymph, poc: 1 (ref 0.6–3.4)
MCH, POC: 30.1 pg (ref 27–31.2)
MCHC: 32.3 g/dL (ref 31.8–35.4)
MCV: 93 fL (ref 80–97)
MPV: 7.2 fL (ref 0–99.8)
POC Granulocyte: 6.3 (ref 2–6.9)
POC LYMPH PERCENT: 13.2 %L (ref 10–50)
Platelet Count, POC: 343 10*3/uL (ref 142–424)
RBC: 3.9 M/uL — AB (ref 4.04–5.48)
RDW, POC: 12.3 %
WBC: 7.9 10*3/uL (ref 4.6–10.2)

## 2014-11-24 NOTE — Telephone Encounter (Signed)
Pt aware she ntbs and will make appt

## 2014-11-24 NOTE — Progress Notes (Signed)
   Subjective:    Patient ID: Crystal [name], female    DOB: 25-Oct-1945, 69 y.o.   MRN: [id number]  HPI Patient is here for follow up from Urgent Care visit.  She has been dx with diarrhea and gastroenteritis.  She was told to get labs at her primary care and she wants labs.  Review of Systems  Constitutional: Negative for fever.  HENT: Negative for ear pain.   Eyes: Negative for discharge.  Respiratory: Negative for cough.   Cardiovascular: Negative for chest pain.  Gastrointestinal: Negative for abdominal distention.  Endocrine: Negative for polyuria.  Genitourinary: Negative for difficulty urinating.  Musculoskeletal: Negative for gait problem and neck pain.  Skin: Negative for color change and rash.  Neurological: Negative for speech difficulty and headaches.  Psychiatric/Behavioral: Negative for agitation.       Objective:    BP 126/69 mmHg  Pulse 92  Temp(Src) 97.5 F (36.4 C) (Oral)  Ht $R'5\' 2"'ca$  (1.575 m)  Wt 101 lb 3.2 oz (45.904 kg)  BMI 18.51 kg/m2 Physical Exam  Constitutional: She is oriented to person, place, and time. She appears well-developed and well-nourished.  HENT:  Head: Normocephalic and atraumatic.  Mouth/Throat: Oropharynx is clear and moist.  Eyes: Pupils are equal, round, and reactive to light.  Neck: Normal range of motion. Neck supple.  Cardiovascular: Normal rate and regular rhythm.   No murmur heard. Pulmonary/Chest: Effort normal and breath sounds normal.  Abdominal: Soft. Bowel sounds are normal. There is no tenderness.  Neurological: She is alert and oriented to person, place, and time.  Skin: Skin is warm and dry.  Psychiatric: She has a normal mood and affect.          Assessment & Plan:     ICD-9-CM ICD-10-CM   1. Noninfectious gastroenteritis, unspecified 558.9 K52.9 POCT CBC     CMP14+EGFR     No Follow-up on file.  Lysbeth Penner FNP

## 2014-11-25 LAB — CMP14+EGFR
ALT: 17 IU/L (ref 0–32)
AST: 25 IU/L (ref 0–40)
Albumin/Globulin Ratio: 2.2 (ref 1.1–2.5)
Albumin: 4 g/dL (ref 3.5–4.8)
Alkaline Phosphatase: 68 IU/L (ref 39–117)
BUN/Creatinine Ratio: 12 (ref 11–26)
BUN: 9 mg/dL (ref 8–27)
CO2: 25 mmol/L (ref 18–29)
Calcium: 8.8 mg/dL (ref 8.7–10.3)
Chloride: 100 mmol/L (ref 97–108)
Creatinine, Ser: 0.73 mg/dL (ref 0.57–1.00)
GFR calc Af Amer: 96 mL/min/{1.73_m2} (ref >59)
GFR calc non Af Amer: 83 mL/min/{1.73_m2} (ref >59)
Globulin, Total: 1.8 g/dL (ref 1.5–4.5)
Glucose: 91 mg/dL (ref 65–99)
Potassium: 4.1 mmol/L (ref 3.5–5.2)
Sodium: 142 mmol/L (ref 134–144)
Total Bilirubin: 0.6 mg/dL (ref 0.0–1.2)
Total Protein: 5.8 g/dL — ABNORMAL LOW (ref 6.0–8.5)

## 2014-11-26 ENCOUNTER — Ambulatory Visit (INDEPENDENT_AMBULATORY_CARE_PROVIDER_SITE_OTHER): Payer: Medicare Other | Admitting: Family Medicine

## 2014-11-26 ENCOUNTER — Encounter: Payer: Self-pay | Admitting: Family Medicine

## 2014-11-26 VITALS — BP 97/61 | HR 90 | Temp 97.9°F | Ht 62.0 in | Wt 102.2 lb

## 2014-11-26 DIAGNOSIS — K529 Noninfective gastroenteritis and colitis, unspecified: Secondary | ICD-10-CM

## 2014-11-26 MED ORDER — HYOSCYAMINE SULFATE 0.125 MG SL SUBL: 0.1250 mg | SUBLINGUAL_TABLET | SUBLINGUAL | Status: DC | PRN

## 2014-11-26 MED ORDER — CIPROFLOXACIN HCL 500 MG PO TABS: 500.0000 mg | ORAL_TABLET | Freq: Two times a day (BID) | ORAL | Status: DC

## 2014-11-26 MED ORDER — ONDANSETRON 4 MG PO TBDP: 4.0000 mg | ORAL_TABLET | Freq: Three times a day (TID) | ORAL | Status: DC | PRN

## 2014-11-26 NOTE — Progress Notes (Signed)
   Subjective:    Patient ID: Crystal [name], female    DOB: 1946/07/31, 69 y.o.   MRN: [id number]  HPI Patient is here for c/o diarrhea.  She was seen 5 days ago in Urgent Care for GE and diarrhea.  She is having 5 bm's which are loose a day.  She is nauseated.  She is having bowel cramping.  Review of Systems  Constitutional: Negative for fever.  HENT: Negative for ear pain.   Eyes: Negative for discharge.  Respiratory: Negative for cough.   Cardiovascular: Negative for chest pain.  Gastrointestinal: Negative for abdominal distention.  Endocrine: Negative for polyuria.  Genitourinary: Negative for difficulty urinating.  Musculoskeletal: Negative for gait problem and neck pain.  Skin: Negative for color change and rash.  Neurological: Negative for speech difficulty and headaches.  Psychiatric/Behavioral: Negative for agitation.       Objective:    BP 97/61 mmHg  Pulse 90  Temp(Src) 97.9 F (36.6 C) (Oral)  Ht 5\' 2"  (1.575 m)  Wt 102 lb 3.2 oz (46.358 kg)  BMI 18.69 kg/m2 Physical Exam  Constitutional: She is oriented to person, place, and time. She appears well-developed and well-nourished.  HENT:  Head: Normocephalic and atraumatic.  Mouth/Throat: Oropharynx is clear and moist.  Eyes: Pupils are equal, round, and reactive to light.  Neck: Normal range of motion. Neck supple.  Cardiovascular: Normal rate and regular rhythm.   No murmur heard. Pulmonary/Chest: Effort normal and breath sounds normal.  Abdominal: Soft. Bowel sounds are normal. There is no tenderness.  Neurological: She is alert and oriented to person, place, and time.  Skin: Skin is warm and dry.  Psychiatric: She has a normal mood and affect.          Assessment & Plan:     ICD-9-CM ICD-10-CM   1. Noninfectious gastroenteritis, unspecified 558.9 K52.9 ciprofloxacin (CIPRO) 500 MG tablet     hyoscyamine (LEVSIN/SL) 0.125 MG SL tablet     ondansetron (ZOFRAN ODT) 4 MG disintegrating tablet      No Follow-up on file.  Lysbeth Penner FNP

## 2014-12-12 ENCOUNTER — Other Ambulatory Visit: Payer: Self-pay | Admitting: Family Medicine

## 2014-12-17 ENCOUNTER — Other Ambulatory Visit: Payer: Self-pay | Admitting: Nurse Practitioner

## 2015-01-07 DIAGNOSIS — C50911 Malignant neoplasm of unspecified site of right female breast: Secondary | ICD-10-CM | POA: Diagnosis not present

## 2015-01-25 ENCOUNTER — Encounter: Payer: Self-pay | Admitting: Internal Medicine

## 2015-01-25 ENCOUNTER — Ambulatory Visit (INDEPENDENT_AMBULATORY_CARE_PROVIDER_SITE_OTHER): Payer: Medicare Other | Admitting: Internal Medicine

## 2015-01-25 VITALS — BP 150/84 | HR 96 | Ht 62.0 in | Wt 101.0 lb

## 2015-01-25 DIAGNOSIS — K5900 Constipation, unspecified: Secondary | ICD-10-CM | POA: Diagnosis not present

## 2015-01-25 DIAGNOSIS — A09 Infectious gastroenteritis and colitis, unspecified: Secondary | ICD-10-CM

## 2015-01-25 NOTE — Patient Instructions (Signed)
Please follow up with Dr. Perry as needed 

## 2015-01-25 NOTE — Progress Notes (Signed)
HISTORY OF PRESENT ILLNESS:  Crystal [name] is a 69 y.o. female , referred by her friend and my patient Crystal [name], to establish her GI care. She has not been seen in this office since 2012 by Dr. Verl Blalock. Patient has a history of chronic constipation for which she has been previously evaluated. 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 No polyps. Upper endoscopy in 2011 to evaluate chest pain was normal. Subsequent esophageal manometry was performed (results uncertain). In any event, her chronic bowel regimen includes MiraLAX and Perdiem 3 times weekly. On this regimen she has acceptable daily bowel movements. She was doing well until late January early February when she developed "intestinal virus" with severe diarrhea. This irritated her hemorrhoids. She was evaluated by urgent care and her PCP. She was treated and improved. Doing well except for some fatigue which also is improving with time. GI review of systems currently otherwise negative.  REVIEW OF SYSTEMS:  All non-GI ROS negative except for anxiety, fatigue, insomnia, excessive urination  Past Medical History  Diagnosis Date  . Abdominal aortic ectasia 01/31/2010    Qualifier: Diagnosis of  By: [name] MD, [place] DISEASE 06/16/2010    Qualifier: Diagnosis of  By: [name] MD, [place]   . CONSTIPATION 01/12/2009    Qualifier: Diagnosis of  By: [name] CMA ([place]), Mearl Latin    . ESOPHAGEAL STRICTURE 01/12/2009    Qualifier: Diagnosis of  By: [name] CMA ([place]), Mearl Latin    . EXTERNAL HEMORRHOIDS 01/12/2009    Qualifier: Diagnosis of  By: [name] CMA ([place]), Mearl Latin    . GERD 05/10/2007    Qualifier: Diagnosis of  By: [name] RMA, [place]    . HYPERLIPIDEMIA 05/10/2007    Qualifier: Diagnosis of  By: [name], [place]    . HYPERTENSION 05/10/2007    Qualifier: Diagnosis of  By: [name], [place]    . Irritable bowel syndrome 01/12/2009    Qualifier: Diagnosis of  By: [name] CMA (AAMA), Mearl Latin    .  PERIPHERAL VASCULAR DISEASE 06/17/2010    Qualifier: Diagnosis of  By: [name] MD, [place] 02/16/2009    Qualifier: Diagnosis of  By: [name] CMA ([place]), Mearl Latin    . STENOSIS, RECTAL 02/16/2009    Qualifier: Diagnosis of  By: [name] CMA ([place]), Mearl Latin    . Tachyarrhythmia 09/03/2009    Qualifier: Diagnosis of  By: [name] MD, [place] ISCHEMIC ATTACK 03/02/2010    Qualifier: Diagnosis of  By: [name] MD, [name] VENOUS INSUFFICIENCY, LEGS 07/18/2010    Qualifier: Diagnosis of  By: [name] MD, [name]   . Eosinophilic esophagitis   . Orthostatic hypotension   . Overactive detrusor   . Adjustment disorder with depressed mood   . Breast cancer 03/11/12    right breast lumpectomy=invasive lobular ca,grade I/III,ER?PR=positive  . Radiation 04/21/2012-06/11/2012    66 gray right breast  . Melanosis   . Rectal stenosis   . Anxiety   . IBS (irritable bowel syndrome)     Past Surgical History  Procedure Laterality Date  . Abdominal hysterectomy    . Hemmorhoidectomy      x 3  . Cardiac catherization    . Esophageal tumor excised; posterior 1986    . Electrocardiogram  10/30/2006  . Edg  04/21/2007  . Breast surgery Left     left breast x 2  . Cardiac catheterization    .  Breast lumpectomy Right     Social History LAURY HUIZAR  reports that she quit smoking about 49 years ago. Her smoking use included Cigarettes. She has never used smokeless tobacco. She reports that she drinks about 1.8 oz of alcohol per week. She reports that she does not use illicit drugs.  family history includes Bone cancer in her father; Heart disease in her brother. There is no history of Colon cancer, Breast cancer, Diabetes, or Anesthesia problems.  No Known Allergies     PHYSICAL EXAMINATION: Vital signs: BP 150/84 mmHg  Pulse 96  Ht 5\' 2"  (1.575 m)  Wt 101 lb (45.813 kg)  BMI 18.47 kg/m2 General: Well-developed, well-nourished, no acute distress HEENT: Sclerae are  anicteric, conjunctiva pink. Oral mucosa intact Lungs: Clear Heart: Regular Abdomen: soft, nontender, nondistended, no obvious ascites, no peritoneal signs, normal bowel sounds. No organomegaly. Extremities: No edema Psychiatric: alert and oriented x3. Cooperative   ASSESSMENT:  #1. Recent self-limited gastroneuritis. Likely viral #2. Chronic constipation. Ongoing #3. 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 Continue current bowel regimen #2. Routine colonoscopy 2020 #3. Routine GI follow-up one year. Sooner if needed

## 2015-02-18 ENCOUNTER — Other Ambulatory Visit (HOSPITAL_BASED_OUTPATIENT_CLINIC_OR_DEPARTMENT_OTHER): Payer: Medicare Other

## 2015-02-18 DIAGNOSIS — C50311 Malignant neoplasm of lower-inner quadrant of right female breast: Secondary | ICD-10-CM

## 2015-02-18 LAB — COMPREHENSIVE METABOLIC PANEL (CC13)
ALT: 14 U/L (ref 0–55)
AST: 20 U/L (ref 5–34)
Albumin: 4.1 g/dL (ref 3.5–5.0)
Alkaline Phosphatase: 79 U/L (ref 40–150)
Anion Gap: 10 mEq/L (ref 3–11)
BUN: 13.4 mg/dL (ref 7.0–26.0)
CALCIUM: 9.6 mg/dL (ref 8.4–10.4)
CO2: 27 mEq/L (ref 22–29)
Chloride: 106 mEq/L (ref 98–109)
Creatinine: 0.7 mg/dL (ref 0.6–1.1)
EGFR: 83 mL/min/{1.73_m2} — AB (ref >90)
GLUCOSE: 97 mg/dL (ref 70–140)
Potassium: 4.1 mEq/L (ref 3.5–5.1)
Sodium: 143 mEq/L (ref 136–145)
Total Bilirubin: 0.86 mg/dL (ref 0.20–1.20)
Total Protein: 6.9 g/dL (ref 6.4–8.3)

## 2015-02-18 LAB — CBC WITH DIFFERENTIAL/PLATELET
BASO%: 0.5 % (ref 0.0–2.0)
Basophils Absolute: 0 10*3/uL (ref 0.0–0.1)
EOS%: 0.9 % (ref 0.0–7.0)
Eosinophils Absolute: 0.1 10*3/uL (ref 0.0–0.5)
HCT: 38.7 % (ref 34.8–46.6)
HEMOGLOBIN: 13 g/dL (ref 11.6–15.9)
LYMPH%: 13.6 % — ABNORMAL LOW (ref 14.0–49.7)
MCH: 31.4 pg (ref 25.1–34.0)
MCHC: 33.6 g/dL (ref 31.5–36.0)
MCV: 93.5 fL (ref 79.5–101.0)
MONO#: 0.4 10*3/uL (ref 0.1–0.9)
MONO%: 6.4 % (ref 0.0–14.0)
NEUT#: 5.1 10*3/uL (ref 1.5–6.5)
NEUT%: 78.6 % — ABNORMAL HIGH (ref 38.4–76.8)
Platelets: 237 10*3/uL (ref 145–400)
RBC: 4.14 10*6/uL (ref 3.70–5.45)
RDW: 12.2 % (ref 11.2–14.5)
WBC: 6.5 10*3/uL (ref 3.9–10.3)
lymph#: 0.9 10*3/uL (ref 0.9–3.3)

## 2015-02-22 ENCOUNTER — Other Ambulatory Visit: Payer: Medicare Other

## 2015-03-01 ENCOUNTER — Ambulatory Visit (HOSPITAL_BASED_OUTPATIENT_CLINIC_OR_DEPARTMENT_OTHER): Payer: Medicare Other | Admitting: Oncology

## 2015-03-01 VITALS — BP 149/76 | HR 89 | Temp 98.4°F | Resp 18 | Ht 62.0 in | Wt 103.4 lb

## 2015-03-01 DIAGNOSIS — Z853 Personal history of malignant neoplasm of breast: Secondary | ICD-10-CM | POA: Diagnosis not present

## 2015-03-01 DIAGNOSIS — M6281 Muscle weakness (generalized): Secondary | ICD-10-CM | POA: Diagnosis not present

## 2015-03-01 DIAGNOSIS — R29898 Other symptoms and signs involving the musculoskeletal system: Secondary | ICD-10-CM

## 2015-03-01 DIAGNOSIS — C50311 Malignant neoplasm of lower-inner quadrant of right female breast: Secondary | ICD-10-CM

## 2015-03-01 NOTE — Progress Notes (Signed)
ID: Crystal [name]   DOB: 03-09-1946  MR#: [id number]  CSN#:[id number]  PCP: Redge Gainer, MD SU: Rolm Bookbinder  CHIEF COMPLAINT: Estrogen receptor positive breast cancer  CURRENT TREATMENT: Observation   HISTORY OF PRESENT ILLNESS: From the original intake not:  The patient saw Dr. Paula Compton for routine gynecologic followup and she noted a palpable mass in the right breast she said Crystal [name] up for right mammography and ultrasonography, performed at the breast center 01/29/2012. The breasts were heterogeneously dense. It mass with dense calcification was noted centrally in the right breast consistent with a benign fibroadenoma. The new palpable mass was not well demonstrated by mammography. It was firm, mobile, and measured approximately 1 cm by palpation. Ultrasound showed an irregularly marginated hypoechoic mass in this area, measuring 1.1 cm.  Biopsy was performed the same day, and showed [phone]) an invasive lobular breast cancer, e-cadherin negative,grade 1, estrogen and progesterone receptor positive, both of 100%, with an MIB-1 of 14%, and no HER-2 amplification.  The patient underwent a left diagnostic mammography 01/30/2012 and bilateral breast MRI 0415/2013.there were no other areas of concern in either breast, and there was no adenopathy noted. By MRI of the right breast mass measured 1.4 cm.   Her subsequent history is as detailed below.  INTERVAL HISTORY: Crystal [name] returns today for followup of her right breast cancer.  The interval history is generally unremarkable, except that she had an episode of "intestinal virus" in January which left her a little bit debilitated. She has now recovered "mostly".  REVIEW OF SYSTEMS: [name] for exercise, but after I walk these days she is feeling more tired than she used to. She finds that she has difficulty opening bottles with her right hand, but not her left.  She tells me her appetite is poor. She has some  urinary stress incontinence but she also drinks a lot of fluids she says, and that "keeps her going". She bruises easily. She feels forgetful and anxious but not depressed. She has back pain on and off, which is not more intense or persistent than before. A detailed review of systems today  was otherwise stable  PAST MEDICAL HISTORY: Past Medical History  Diagnosis Date  . Abdominal aortic ectasia 01/31/2010    Qualifier: Diagnosis of  By: [name] MD, [place] DISEASE 06/16/2010    Qualifier: Diagnosis of  By: [name] MD, [place]   . CONSTIPATION 01/12/2009    Qualifier: Diagnosis of  By: [name] CMA ([place]), Mearl Latin    . ESOPHAGEAL STRICTURE 01/12/2009    Qualifier: Diagnosis of  By: [name] CMA ([place]), Mearl Latin    . EXTERNAL HEMORRHOIDS 01/12/2009    Qualifier: Diagnosis of  By: [name] CMA ([place]), Mearl Latin    . GERD 05/10/2007    Qualifier: Diagnosis of  By: [name] RMA, [place]    . HYPERLIPIDEMIA 05/10/2007    Qualifier: Diagnosis of  By: [name], [place]    . HYPERTENSION 05/10/2007    Qualifier: Diagnosis of  By: [name], [place]    . Irritable bowel syndrome 01/12/2009    Qualifier: Diagnosis of  By: [name] CMA (AAMA), Mearl Latin    . PERIPHERAL VASCULAR DISEASE 06/17/2010    Qualifier: Diagnosis of  By: [name] MD, [place] 02/16/2009    Qualifier: Diagnosis of  By: [name] CMA ([place]), Mearl Latin    . STENOSIS, RECTAL 02/16/2009    Qualifier: Diagnosis of  By: [name] CMA ([place]), Mearl Latin    .  Tachyarrhythmia 09/03/2009    Qualifier: Diagnosis of  By: [name] MD, [place] ISCHEMIC ATTACK 03/02/2010    Qualifier: Diagnosis of  By: [name] MD, [name] VENOUS INSUFFICIENCY, LEGS 07/18/2010    Qualifier: Diagnosis of  By: [name] MD, [name]   . Eosinophilic esophagitis   . Orthostatic hypotension   . Overactive detrusor   . Adjustment disorder with depressed mood   . Breast cancer 03/11/12    right breast lumpectomy=invasive lobular ca,grade  I/III,ER?PR=positive  . Radiation 04/21/2012-06/11/2012    64 gray right breast  . Melanosis   . Rectal stenosis   . Anxiety   . IBS (irritable bowel syndrome)     PAST SURGICAL HISTORY: Past Surgical History  Procedure Laterality Date  . Abdominal hysterectomy    . Hemmorhoidectomy      x 3  . Cardiac catherization    . Esophageal tumor excised; posterior 1986    . Electrocardiogram  10/30/2006  . Edg  04/21/2007  . Breast surgery Left     left breast x 2  . Cardiac catheterization    . Breast lumpectomy Right     FAMILY HISTORY Family History  Problem Relation Age of Onset  . Bone cancer Father   . Heart disease Brother     MI  . Colon cancer Neg Hx   . Breast cancer Neg Hx   . Diabetes Neg Hx   . Anesthesia problems Neg Hx   The patient's father died from "bone cancer" at the age of 9. The patient's mother died in an automobile accident at the age of 4. The patient had one brother who died from a heart attack at the age of 89. The patient had no sisters.  GYNECOLOGIC HISTORY: Menarche age 3, she is [place] with first live birth at age 61. She underwent to the change of life approximately 1978. She did not take hormone replacement.  SOCIAL HISTORY: She used to work in [employer] but is now retired. Her husband died in a plane crash approximately 5 years ago. Her son, Crystal [name], 37, works in [employer] as an Clinical biochemist. The patient has no grandchildren. She lives alone with her cat [name].  ADVANCED DIRECTIVES: in place per Dr [name]' note  HEALTH MAINTENANCE: History  Substance Use Topics  . Smoking status: Former Smoker    Types: Cigarettes    Quit date: 10/22/1965  . Smokeless tobacco: Never Used  . Alcohol Use: 1.8 oz/week    3 Glasses of wine per week     Comment: occasional      Colonoscopy: 2010  PAP: 2011  Bone density: 2011/ osteopenia  Lipid panel: UTD per Dr [name]  No Known Allergies  Current Outpatient Prescriptions  Medication Sig  Dispense Refill  . amLODipine (NORVASC) 5 MG tablet TAKE ONE TABLET BY MOUTH ONE TIME DAILY 30 tablet 5  . aspirin 325 MG EC tablet Take 325 mg by mouth daily.    . polyethylene glycol (MIRALAX / GLYCOLAX) packet Take 17 g by mouth 3 (three) times a week.    . Senna-Psyllium (PERDIEM PO) Take 2 tablets by mouth at bedtime.     No current facility-administered medications for this visit.    OBJECTIVE: Older white woman in no acute distress Filed Vitals:   03/01/15 1304  BP: 149/76  Pulse: 89  Temp: 98.4 F (36.9 C)  Resp: 18     Body mass index is 18.91 kg/(m^2).  ECOG FS:1 Filed Weights   03/01/15 1304  Weight: 103 lb 6.4 oz (46.902 kg)   Sclerae unicteric,  Pupils round and equal Oropharynx clear and moist No cervical or supraclavicular adenopathy Lungs no rales or rhonchi Heart regular rate and rhythm Abdomen soft, nontender, positive bowel sounds MSK  Mild kyphosis but no focal spinal tenderness; no upper extremity lymphedema Neuro:  The right grip is clearly decreased as compared to the left. Otherwise exam is nonfocal and the patient is well oriented,  With positive affect. Breasts: The right breast is status post lumpectomy and radiation. There is no evidence of disease recurrence. The right axilla is benign. The left breast is unremarkable   LAB RESULTS: Lab Results  Component Value Date   WBC 6.5 02/18/2015   NEUTROABS 5.1 02/18/2015   HGB 13.0 02/18/2015   HCT 38.7 02/18/2015   MCV 93.5 02/18/2015   PLT 237 02/18/2015      Chemistry      Component Value Date/Time   NA 143 02/18/2015 1014   NA 142 11/24/2014 1058   NA 142 03/25/2014 1050   K 4.1 02/18/2015 1014   K 4.1 11/24/2014 1058   CL 100 11/24/2014 1058   CL 105 03/25/2013 0925   CO2 27 02/18/2015 1014   CO2 25 11/24/2014 1058   BUN 13.4 02/18/2015 1014   BUN 9 11/24/2014 1058   BUN 15 03/25/2014 1050   CREATININE 0.7 02/18/2015 1014   CREATININE 0.73 11/24/2014 1058   CREATININE 0.69  03/25/2014 1050      Component Value Date/Time   CALCIUM 9.6 02/18/2015 1014   CALCIUM 8.8 11/24/2014 1058   ALKPHOS 79 02/18/2015 1014   ALKPHOS 68 11/24/2014 1058   AST 20 02/18/2015 1014   AST 25 11/24/2014 1058   ALT 14 02/18/2015 1014   ALT 17 11/24/2014 1058   BILITOT 0.86 02/18/2015 1014   BILITOT 0.6 11/24/2014 1058      Lab Results  Component Value Date   LABCA2 10 02/06/2012    STUDIES: CLINICAL DATA: 69 year old female for annual bilateral mammograms. History of right breast cancer and radiation treatment in 2013.  EXAM: DIGITAL DIAGNOSTIC BILATERAL MAMMOGRAM WITH 3D TOMOSYNTHESIS AND CAD  COMPARISON: 11/05/2013 and prior mammograms dating back to 01/29/2012  ACR Breast Density Category c: The breast tissue is heterogeneously dense, which may obscure small masses.  FINDINGS: A magnification view of the lumpectomy site and routine views of both breasts demonstrate no suspicious mass, nonsurgical distortion or worrisome calcifications.  Postsurgical and treatment changes of the right breast identified.  Mammographic images were processed with CAD.  IMPRESSION: No evidence of breast malignancy.  Right breast posttreatment changes and scarring.  RECOMMENDATION: Bilateral diagnostic mammograms in 1 year.  I have discussed the findings and recommendations with the patient. Results were also provided in writing at the conclusion of the visit. If applicable, a reminder letter will be sent to the patient regarding the next appointment.  BI-RADS CATEGORY 2: Benign.   Electronically Signed  By: Hassan Rowan M.D.  On: 11/08/2014 10:42  ASSESSMENT: 69 y.o.  [place] woman status post right lumpectomy 03/11/2012 for a pT1c N0, stage IA, invasive lobular breast cancer, grade 1, with a broadly positive margin which according to surgery could not be cleared short of mastectomy. The tumor is strongly estrogen and progesterone receptor positive,  with a low Mib-1 and no evidence of HER-2 amplification.  (0) The Oncotype recurrence score is 23, predicting a 15% chance of distant recurrence  with adjuvant tamoxifen. The Adjuvant! Program predicts a risk of recurrence of 17%, dropping to 9% with aromatase inhibitors.  (1) completed adjuvant irradiation 06/11/2012  (2) started anastrozole 06/11/2012 and held since 06/19/2012 due to acute abdominal pain and elevated LFTs.    (3)  Started letrozole in October 2013, discontinued January 2014 with poor tolerance  (4) osteoporosis: yearly zolendronic acid, first dose 01/20/2013, tolerated well  (5) exemestane started mid April 2014, but tolerated poorly  (6) questionable lung lesions found to be stable to resolved by chest CT 07/13/2013--no further followup needed  (7) anxiety disorder  (8) not a tamoxifen candidate given prior clotting history  PLAN:  Crystal [name] is now 3 years out from her definitive surgery with no evidence of disease recurrence. This is very favorable. She has not been able to tolerate anti-estrogens so we are following her with observation alone.   I don't know if she is having carpal tunnel on the right or there is another reason for the poor grip but I think this should be evaluated and I am referring her to a hand surgeon for this.    otherwise Crystal [name] will see [place] again in one year , and then she'll see me specifically and 2 years at which time she likely will "graduate" from follow-up here. She has a good understanding of the above plan. She agrees with it. She will call with any problems that may develop before the next visit.  Betsi Crespi C    03/01/2015

## 2015-03-02 ENCOUNTER — Telehealth: Payer: Self-pay | Admitting: Oncology

## 2015-03-02 NOTE — Telephone Encounter (Signed)
Confirmed referral appointment to Dr.Kuzma 05/13 9:30

## 2015-03-04 DIAGNOSIS — M47812 Spondylosis without myelopathy or radiculopathy, cervical region: Secondary | ICD-10-CM | POA: Diagnosis not present

## 2015-03-04 DIAGNOSIS — G5601 Carpal tunnel syndrome, right upper limb: Secondary | ICD-10-CM | POA: Diagnosis not present

## 2015-03-07 DIAGNOSIS — G5601 Carpal tunnel syndrome, right upper limb: Secondary | ICD-10-CM | POA: Diagnosis not present

## 2015-03-07 DIAGNOSIS — G5602 Carpal tunnel syndrome, left upper limb: Secondary | ICD-10-CM | POA: Diagnosis not present

## 2015-03-07 DIAGNOSIS — M47812 Spondylosis without myelopathy or radiculopathy, cervical region: Secondary | ICD-10-CM | POA: Diagnosis not present

## 2015-03-10 DIAGNOSIS — Z961 Presence of intraocular lens: Secondary | ICD-10-CM | POA: Diagnosis not present

## 2015-03-10 DIAGNOSIS — H43393 Other vitreous opacities, bilateral: Secondary | ICD-10-CM | POA: Diagnosis not present

## 2015-03-10 DIAGNOSIS — H5203 Hypermetropia, bilateral: Secondary | ICD-10-CM | POA: Diagnosis not present

## 2015-03-10 DIAGNOSIS — H52223 Regular astigmatism, bilateral: Secondary | ICD-10-CM | POA: Diagnosis not present

## 2015-03-17 ENCOUNTER — Other Ambulatory Visit (HOSPITAL_COMMUNITY): Payer: Self-pay | Admitting: Orthopaedic Surgery

## 2015-03-17 DIAGNOSIS — M4712 Other spondylosis with myelopathy, cervical region: Secondary | ICD-10-CM | POA: Diagnosis not present

## 2015-03-17 DIAGNOSIS — M542 Cervicalgia: Secondary | ICD-10-CM

## 2015-03-28 ENCOUNTER — Ambulatory Visit (INDEPENDENT_AMBULATORY_CARE_PROVIDER_SITE_OTHER): Payer: Medicare Other | Admitting: Physician Assistant

## 2015-03-28 ENCOUNTER — Encounter: Payer: Self-pay | Admitting: Physician Assistant

## 2015-03-28 VITALS — BP 128/70 | HR 80 | Temp 97.7°F | Resp 16 | Ht 62.0 in | Wt 103.0 lb

## 2015-03-28 DIAGNOSIS — Z79899 Other long term (current) drug therapy: Secondary | ICD-10-CM | POA: Diagnosis not present

## 2015-03-28 DIAGNOSIS — I951 Orthostatic hypotension: Secondary | ICD-10-CM

## 2015-03-28 DIAGNOSIS — Z0001 Encounter for general adult medical examination with abnormal findings: Secondary | ICD-10-CM | POA: Diagnosis not present

## 2015-03-28 DIAGNOSIS — M810 Age-related osteoporosis without current pathological fracture: Secondary | ICD-10-CM | POA: Diagnosis not present

## 2015-03-28 DIAGNOSIS — I872 Venous insufficiency (chronic) (peripheral): Secondary | ICD-10-CM

## 2015-03-28 DIAGNOSIS — I77811 Abdominal aortic ectasia: Secondary | ICD-10-CM

## 2015-03-28 DIAGNOSIS — C50311 Malignant neoplasm of lower-inner quadrant of right female breast: Secondary | ICD-10-CM

## 2015-03-28 DIAGNOSIS — R000 Tachycardia, unspecified: Secondary | ICD-10-CM | POA: Diagnosis not present

## 2015-03-28 DIAGNOSIS — E785 Hyperlipidemia, unspecified: Secondary | ICD-10-CM

## 2015-03-28 DIAGNOSIS — R6889 Other general symptoms and signs: Secondary | ICD-10-CM | POA: Diagnosis not present

## 2015-03-28 DIAGNOSIS — K589 Irritable bowel syndrome without diarrhea: Secondary | ICD-10-CM

## 2015-03-28 DIAGNOSIS — I10 Essential (primary) hypertension: Secondary | ICD-10-CM | POA: Diagnosis not present

## 2015-03-28 DIAGNOSIS — Z9181 History of falling: Secondary | ICD-10-CM

## 2015-03-28 DIAGNOSIS — K644 Residual hemorrhoidal skin tags: Secondary | ICD-10-CM

## 2015-03-28 DIAGNOSIS — K624 Stenosis of anus and rectum: Secondary | ICD-10-CM

## 2015-03-28 DIAGNOSIS — I679 Cerebrovascular disease, unspecified: Secondary | ICD-10-CM

## 2015-03-28 DIAGNOSIS — K2100 Gastro-esophageal reflux disease with esophagitis, without bleeding: Secondary | ICD-10-CM

## 2015-03-28 DIAGNOSIS — G459 Transient cerebral ischemic attack, unspecified: Secondary | ICD-10-CM

## 2015-03-28 DIAGNOSIS — I739 Peripheral vascular disease, unspecified: Secondary | ICD-10-CM

## 2015-03-28 LAB — HEPATIC FUNCTION PANEL
ALK PHOS: 65 U/L (ref 39–117)
ALT: 13 U/L (ref 0–35)
AST: 20 U/L (ref 0–37)
Albumin: 4.4 g/dL (ref 3.5–5.2)
BILIRUBIN INDIRECT: 1 mg/dL (ref 0.2–1.2)
BILIRUBIN TOTAL: 1.2 mg/dL (ref 0.2–1.2)
Bilirubin, Direct: 0.2 mg/dL (ref 0.0–0.3)
Total Protein: 6.9 g/dL (ref 6.0–8.3)

## 2015-03-28 LAB — MAGNESIUM: Magnesium: 2.2 mg/dL (ref 1.5–2.5)

## 2015-03-28 LAB — CBC WITH DIFFERENTIAL/PLATELET
Basophils Absolute: 0 10*3/uL (ref 0.0–0.1)
Basophils Relative: 0 % (ref 0–1)
EOS ABS: 0.1 10*3/uL (ref 0.0–0.7)
Eosinophils Relative: 1 % (ref 0–5)
HCT: 38.7 % (ref 36.0–46.0)
Hemoglobin: 13 g/dL (ref 12.0–15.0)
LYMPHS ABS: 1.1 10*3/uL (ref 0.7–4.0)
Lymphocytes Relative: 14 % (ref 12–46)
MCH: 30.7 pg (ref 26.0–34.0)
MCHC: 33.6 g/dL (ref 30.0–36.0)
MCV: 91.5 fL (ref 78.0–100.0)
MPV: 10.2 fL (ref 8.6–12.4)
Monocytes Absolute: 0.5 10*3/uL (ref 0.1–1.0)
Monocytes Relative: 6 % (ref 3–12)
NEUTROS ABS: 6.3 10*3/uL (ref 1.7–7.7)
NEUTROS PCT: 79 % — AB (ref 43–77)
Platelets: 271 10*3/uL (ref 150–400)
RBC: 4.23 MIL/uL (ref 3.87–5.11)
RDW: 12.7 % (ref 11.5–15.5)
WBC: 8 10*3/uL (ref 4.0–10.5)

## 2015-03-28 LAB — BASIC METABOLIC PANEL WITH GFR
BUN: 13 mg/dL (ref 6–23)
CO2: 29 meq/L (ref 19–32)
CREATININE: 0.69 mg/dL (ref 0.50–1.10)
Calcium: 9.9 mg/dL (ref 8.4–10.5)
Chloride: 102 mEq/L (ref 96–112)
GFR, Est African American: >89 mL/min
GFR, Est Non African American: 88 mL/min
Glucose, Bld: 85 mg/dL (ref 70–99)
Potassium: 4.4 mEq/L (ref 3.5–5.3)
SODIUM: 141 meq/L (ref 135–145)

## 2015-03-28 LAB — TSH: TSH: 1.403 u[IU]/mL (ref 0.350–4.500)

## 2015-03-28 LAB — LIPID PANEL
CHOLESTEROL: 233 mg/dL — AB (ref 0–200)
HDL: 85 mg/dL (ref >=46)
LDL CALC: 134 mg/dL — AB (ref 0–99)
TRIGLYCERIDES: 71 mg/dL (ref <150)
Total CHOL/HDL Ratio: 2.7 Ratio
VLDL: 14 mg/dL (ref 0–40)

## 2015-03-28 NOTE — Progress Notes (Signed)
MEDICARE ANNUAL WELLNESS VISIT AND CPE  Assessment:   1. HYPERTENSION - CBC with Differential - BASIC METABOLIC PANEL WITH GFR - Hepatic function panel - TSH - Urinalysis, Routine w reflex microscopic - Microalbumin / creatinine urine ratio - EKG 12-Lead - [place], RETROPERITNL ABD,  LTD  2. PERIPHERAL VASCULAR DISEASE Elevate legs, compression stockings  3. HYPERLIPIDEMIA - Lipid panel  4. Insomnia-  -good sleep hygiene discussed, increase day time activity, try melatonin or benadryl if this does not help we will call in sleep medication.   5. Elevated glucose Check A1C - Hemoglobin A1c  6. Unspecified vitamin D deficiency  7.Encounter for long-term (current) use of other medications - Magnesium  8. OAB/trouble sleeping - bladder matters - decrease liquid before bed  9. Osteoporosis, unspecified - declines meds at this time, will get on vitamin D/calcium and discussed strength training.  - DG Bone Density; Future  10. History of TIA/CVA Continue ASA Control blood pressure, cholesterol, glucose, increase exercise.   11. GERD Likely due to ASA, add H2  12. History of breast cancer Continue close follow up   Plan:   During the course of the visit the patient was educated and counseled about appropriate screening and preventive services including:    Pneumococcal vaccine   Influenza vaccine  Td vaccine  Screening electrocardiogram  Screening mammography  Bone densitometry screening  Colorectal cancer screening  Diabetes screening  Glaucoma screening  Nutrition counseling   Advanced directives: given information/requested  Screening recommendations, referrals:  Vaccinations: Tdap vaccine not indicated Influenza vaccine Due in Aug/Sept Pneumococcal vaccine not indicated Shingles vaccine not indicated Hep B vaccine not indicated  Nutrition assessed and recommended  Colonoscopy up to date Mammogram up to date, regular follow ups due to  breast cancer Pap smear not indicated Pelvic exam not indicated Recommended yearly ophthalmology/optometry visit for glaucoma screening and checkup Recommended yearly dental visit for hygiene and checkup Advanced directives - requested  Conditions/risks identified: BMI: Discussed weight loss, diet, and increase physical activity.  Increase physical activity: AHA recommends 150 minutes of physical activity a week.  Medications reviewed DEXA- requested- will [facility] with [facility] Urinary Incontinence is an issue: discussed non pharmacology and pharmacology options.  Fall risk: low- discussed PT, home fall assessment, medications.   Subjective:   Crystal [name] is a 69 y.o. female who presents for Medicare Annual Wellness Visit and complete physical.  Date of last medicare wellness visit was   Her blood pressure has been controlled at home, today their BP is BP: 128/70 mmHg She does not workout. She denies chest pain, shortness of breath, dizziness.  She is not on cholesterol medication and denies myalgias. Her cholesterol is not at goal. The cholesterol last visit was:   Lab Results  Component Value Date   CHOL 215* 03/25/2014   HDL 60 03/25/2014   LDLCALC 138* 03/25/2014   LDLDIRECT 147.6 06/30/2012   TRIG 86 03/25/2014   CHOLHDL 3.6 03/25/2014   Lab Results  Component Value Date   HGBA1C 5.5 03/25/2014   Patient is not on Vitamin D supplement.  Lab Results  Component Value Date   VD25OH 31 06/04/2012    She continues to see her Oncologist and get MRI breast yearly.  She states she gets to sleep fine, but she wakes up 2-3 times a night having to use the restroom, never tried samples, will try to decrease fluids before bed.  She has IBS/constipatoin and is on miralax which helps. She is following with  Dr. Lorin Mercy for right shoulder pain and right arm weakness, getting MRI of her neck on Thursday and will take valium for that.    Names of Other Physician/Practitioners you  currently use: 1. [facility]- here for primary care 2. Dr. Sabra Heck, eye doctor, 02/2015 3. Dr. Talbert Forest Sept 2015, cataract removal 4.  Dr. Berdine Addison, dentist, 03/25/2015 5. Dr. Lorin Mercy, ortho Patient Care Team: Chipper Herb, MD as PCP - General (Family Medicine)   Medication Review Current Outpatient Prescriptions on File Prior to Visit  Medication Sig Dispense Refill  . amLODipine (NORVASC) 5 MG tablet TAKE ONE TABLET BY MOUTH ONE TIME DAILY 30 tablet 5  . aspirin 325 MG EC tablet Take 325 mg by mouth daily.    . polyethylene glycol (MIRALAX / GLYCOLAX) packet Take 17 g by mouth 3 (three) times a week.    . Senna-Psyllium (PERDIEM PO) Take 2 tablets by mouth at bedtime.     No current facility-administered medications on file prior to visit.    Current Problems (verified) Patient Active Problem List   Diagnosis Date Noted  . Decreased grip strength of right hand 03/01/2015  . Breast cancer of lower-inner quadrant of right female breast 07/20/2013  . Routine health maintenance 01/08/2012  . ABDOMINAL PAIN RIGHT UPPER QUADRANT 11/07/2010  . COUGH 08/15/2010  . VENOUS INSUFFICIENCY, LEGS 07/18/2010  . PERIPHERAL VASCULAR DISEASE 06/17/2010  . CEREBROVASCULAR DISEASE 06/16/2010  . HYPOTENSION, ORTHOSTATIC 06/16/2010  . TRANSIENT ISCHEMIC ATTACK 03/02/2010  . DETRUSOR, OVERACTIVE 03/02/2010  . ABDOMINAL AORTIC ECTASIA 01/31/2010  . TACHYARRHYTHMIA 09/03/2009  . RECTAL FISSURE 02/16/2009  . STENOSIS, RECTAL 02/16/2009  . MELANOSIS COLI 02/16/2009  . EXTERNAL HEMORRHOIDS 01/12/2009  . ESOPHAGEAL STRICTURE 01/12/2009  . CONSTIPATION 01/12/2009  . IRRITABLE BOWEL SYNDROME 01/12/2009  . SYNCOPE 04/19/2008  . HYPERLIPIDEMIA 05/10/2007  . HYPERTENSION 05/10/2007  . GERD 05/10/2007    Screening Tests Health Maintenance  Topic Date Due  . INFLUENZA VACCINE  05/23/2015  . MAMMOGRAM  11/08/2016  . COLONOSCOPY  01/18/2019  . TETANUS/TDAP   06/04/2023  . DEXA SCAN  Completed  . ZOSTAVAX  Addressed  . PNA vac Low Risk Adult  Completed    Immunization History  Administered Date(s) Administered  . Influenza Split 08/22/2011, 06/30/2012  . Influenza Whole 08/13/2008, 08/03/2009, 07/18/2010  . Influenza,inj,Quad PF,36+ Mos 07/28/2013  . Pneumococcal Conjugate-13 10/20/2013  . Pneumococcal Polysaccharide-23 08/13/2008  . Pneumococcal-Unspecified 11/12/2009  . Td 01/31/2010  . Tdap 08/22/2011    Preventative care: Last colonoscopy: 2010 Dr. Sharlett Iles but is retired, due 2020 Last mammogram: 10/2014, 1 year MRI breast 03/2014 Last pap smear/pelvic exam: 2010   DEXA: 05/2012 CT chest 06/2013 [place] AB 05/2012 Echo 2009  Prior vaccinations: TD or Tdap: 2012  Influenza: 2015  Pneumococcal: 2009 Prevnar 13: 2014 Shingles/Zostavax: 12/2011   History reviewed: allergies, current medications, past family history, past medical history, past social history, past surgical history and problem list  Risk Factors: Osteoporosis: postmenopausal estrogen deficiency, dietary calcium and/or vitamin D deficiency and smoking history, low BMI.  History of fracture in the past year: no  Tobacco History  Substance Use Topics  . Smoking status: Former Smoker    Types: Cigarettes    Quit date: 10/22/1965  . Smokeless tobacco: Never Used  . Alcohol Use: 1.8 oz/week    3 Glasses of wine per week     Comment: occasional    She does not smoke.  Patient is a former smoker. Are there  smokers in your home (other than you)?  No  Alcohol Current alcohol use: social drinker  Caffeine Current caffeine use: coffee 1 /day  Exercise  Current exercise: none  Nutrition/Diet Current diet: in general, a "healthy" diet    Cardiac risk factors: advanced age (older than 70 for men, 71 for women), dyslipidemia, hypertension and sedentary lifestyle.  Depression Screen (Note: if answer to either of the following is "Yes", a more complete  depression screening is indicated)   Q1: Over the past two weeks, have you felt down, depressed or hopeless? No  Q2: Over the past two weeks, have you felt little interest or pleasure in doing things? No  Have you lost interest or pleasure in daily life? No  Do you often feel hopeless? No  Do you cry easily over simple problems? No  Activities of Daily Living In your present state of health, do you have any difficulty performing the following activities?:  Driving? No Managing money?  No Feeding yourself? No Getting from bed to chair? No Climbing a flight of stairs? No Preparing food and eating?: No Bathing or showering? No Getting dressed: No Getting to the toilet? No Using the toilet:No Moving around from place to place: No In the past year have you fallen or had a near fall?:No   Are you sexually active?  No  Do you have more than one partner?  No  Vision Difficulties: No  Hearing Difficulties: No Do you often ask people to speak up or repeat themselves? No Do you experience ringing or noises in your ears? No Do you have difficulty understanding soft or whispered voices? No  Cognition  Do you feel that you have a problem with memory?No  Do you often misplace items? No  Do you feel safe at home?  Yes  Advanced directives Does patient have a [place]? Yes Does patient have a Living Will? Yes   Objective:     Blood pressure 128/70, pulse 80, temperature 97.7 F (36.5 C), resp. rate 16, height 5\' 2"  (1.575 m), weight 103 lb (46.72 kg). Body mass index is 18.83 kg/(m^2).  General appearance: alert, no distress, WD/WN,  female Cognitive Testing  Alert? Yes  Normal Appearance?Yes  Oriented to person? Yes  Place? Yes   Time? Yes  Recall of three objects?  Yes  Can perform simple calculations? Yes  Displays appropriate judgment?Yes  Can read the correct time from a watch face?Yes  HEENT: normocephalic, sclerae anicteric, TMs pearly, nares  patent, no discharge or erythema, pharynx normal Oral cavity: MMM, no lesions Neck: supple, no lymphadenopathy, no thyromegaly, no masses Heart: RRR, normal S1, S2, no murmurs Lungs: CTA bilaterally, no wheezes, rhonchi, or rales Abdomen: +bs, soft, non tender, non distended, no masses, no hepatomegaly, no splenomegaly Musculoskeletal: nontender, no swelling, no obvious deformity Extremities: no edema, no cyanosis, no clubbing Pulses: 2+ symmetric, upper and lower extremities, normal cap refill Neurological: alert, oriented x 3, CN2-12 intact, strength normal upper extremities and lower extremities, sensation normal throughout, DTRs 2+ throughout, no cerebellar signs, gait normal Psychiatric: normal affect, behavior normal, pleasant  Skin: On left anterior medial leg, scaly erythematous annular lesion.  Breast: defer Gyn: defer  Rectal: defer  Medicare Attestation I have personally reviewed: The patient's medical and social history Their use of alcohol, tobacco or illicit drugs Their current medications and supplements The patient's functional ability including ADLs,fall risks, home safety risks, cognitive, and hearing and visual impairment Diet and physical activities Evidence  for depression or mood disorders  The patient's weight, height, BMI, and visual acuity have been recorded in the chart.  I have made referrals, counseling, and provided education to the patient based on review of the above and I have provided the patient with a written personalized care plan for preventive services.     Vicie Mutters, PA-C   03/28/2015

## 2015-03-28 NOTE — Patient Instructions (Addendum)
Osteoporosis Throughout your life, your body breaks down old bone and replaces it with new bone. As you get older, your body does not replace bone as quickly as it breaks it down. By the age of 72 years, most people begin to gradually lose bone because of the imbalance between bone loss and replacement. Some people lose more bone than others. Bone loss beyond a specified normal degree is considered osteoporosis.  Osteoporosis affects the strength and durability of your bones. The inside of the ends of your bones and your flat bones, like the bones of your pelvis, look like honeycomb, filled with tiny open spaces. As bone loss occurs, your bones become less dense. This means that the open spaces inside your bones become bigger and the walls between these spaces become thinner. This makes your bones weaker. Bones of a person with osteoporosis can become so weak that they can break (fracture) during minor accidents, such as a simple fall. CAUSES  The following factors have been associated with the development of osteoporosis:  Smoking.  Drinking more than 2 alcoholic drinks several days per week.  Long-term use of certain medicines:  Corticosteroids.  Chemotherapy medicines.  Thyroid medicines.  Antiepileptic medicines.  Gonadal hormone suppression medicine.  Immunosuppression medicine.  Being underweight.  Lack of physical activity.  Lack of exposure to the sun. This can lead to vitamin D deficiency.  Certain medical conditions:  Certain inflammatory bowel diseases, such as Crohn disease and ulcerative colitis.  Diabetes.  Hyperthyroidism.  Hyperparathyroidism. RISK FACTORS Anyone can develop osteoporosis. However, the following factors can increase your risk of developing osteoporosis:  Gender--Women are at higher risk than men.  Age--Being older than 50 years increases your risk.  Ethnicity--White and Asian people have an increased risk.  Weight --Being extremely  underweight can increase your risk of osteoporosis.  Family history of osteoporosis--Having a family member who has developed osteoporosis can increase your risk. SYMPTOMS  Usually, people with osteoporosis have no symptoms.  DIAGNOSIS  Signs during a physical exam that may prompt your caregiver to suspect osteoporosis include:  Decreased height. This is usually caused by the compression of the bones that form your spine (vertebrae) because they have weakened and become fractured.  A curving or rounding of the upper back (kyphosis). To confirm signs of osteoporosis, your caregiver may request a procedure that uses 2 low-dose X-ray beams with different levels of energy to measure your bone mineral density (dual-energy X-ray absorptiometry [DXA]). Also, your caregiver may check your level of vitamin D. TREATMENT  The goal of osteoporosis treatment is to strengthen bones in order to decrease the risk of bone fractures. There are different types of medicines available to help achieve this goal. Some of these medicines work by slowing the processes of bone loss. Some medicines work by increasing bone density. Treatment also involves making sure that your levels of calcium and vitamin D are adequate. PREVENTION  There are things you can do to help prevent osteoporosis. Adequate intake of calcium and vitamin D can help you achieve optimal bone mineral density. Regular exercise can also help, especially resistance and weight-bearing activities. If you smoke, quitting smoking is an important part of osteoporosis prevention. MAKE SURE YOU:  Understand these instructions.  Will watch your condition.  Will get help right away if you are not doing well or get worse. FOR MORE INFORMATION www.osteo.org and [employer].ee Document Released: 07/18/2005 Document Revised: 02/02/2013 Document Reviewed: 09/22/2011 [facility] Patient Information 2015 [place], [place]. This information is not  intended to replace advice  given to you by your health care provider. Make sure you discuss any questions you have with your health care provider.  Preventive Care for Adults A healthy lifestyle and preventive care can promote health and wellness. Preventive health guidelines for women include the following key practices.  A routine yearly physical is a good way to check with your health care provider about your health and preventive screening. It is a chance to share any concerns and updates on your health and to receive a thorough exam.  Visit your dentist for a routine exam and preventive care every 6 months. Brush your teeth twice a day and floss once a day. Good oral hygiene prevents tooth decay and gum disease.  The frequency of eye exams is based on your age, health, family medical history, use of contact lenses, and other factors. Follow your health care provider's recommendations for frequency of eye exams.  Eat a healthy diet. Foods like vegetables, fruits, whole grains, low-fat dairy products, and lean protein foods contain the nutrients you need without too many calories. Decrease your intake of foods high in solid fats, added sugars, and salt. Eat the right amount of calories for you.Get information about a proper diet from your health care provider, if necessary.  Regular physical exercise is one of the most important things you can do for your health. Most adults should get at least 150 minutes of moderate-intensity exercise (any activity that increases your heart rate and causes you to sweat) each week. In addition, most adults need muscle-strengthening exercises on 2 or more days a week.  Maintain a healthy weight. The body mass index (BMI) is a screening tool to identify possible weight problems. It provides an estimate of body fat based on height and weight. Your health care provider can find your BMI and can help you achieve or maintain a healthy weight.For adults 20 years and older:  A BMI below 18.5 is  considered underweight.  A BMI of 18.5 to 24.9 is normal.  A BMI of 25 to 29.9 is considered overweight.  A BMI of 30 and above is considered obese.  Maintain normal blood lipids and cholesterol levels by exercising and minimizing your intake of saturated fat. Eat a balanced diet with plenty of fruit and vegetables. If your lipid or cholesterol levels are high, you are over 50, or you are at high risk for heart disease, you may need your cholesterol levels checked more frequently.Ongoing high lipid and cholesterol levels should be treated with medicines if diet and exercise are not working.  If you smoke, find out from your health care provider how to quit. If you do not use tobacco, do not start.  Lung cancer screening is recommended for adults aged 69-80 years who are at high risk for developing lung cancer because of a history of smoking. A yearly low-dose CT scan of the lungs is recommended for people who have at least a 30-pack-year history of smoking and are a current smoker or have quit within the past 15 years. A pack year of smoking is smoking an average of 1 pack of cigarettes a day for 1 year (for example: 1 pack a day for 30 years or 2 packs a day for 15 years). Yearly screening should continue until the smoker has stopped smoking for at least 15 years. Yearly screening should be stopped for people who develop a health problem that would prevent them from having lung cancer treatment.  Avoid use  of street drugs. Do not share needles with anyone. Ask for help if you need support or instructions about stopping the use of drugs.  High blood pressure causes heart disease and increases the risk of stroke.  Ongoing high blood pressure should be treated with medicines if weight loss and exercise do not work.  If you are 58-58 years old, ask your health care provider if you should take aspirin to prevent strokes.  Diabetes screening involves taking a blood sample to check your fasting blood  sugar level. This should be done once every 3 years, after age 80, if you are within normal weight and without risk factors for diabetes. Testing should be considered at a younger age or be carried out more frequently if you are overweight and have at least 1 risk factor for diabetes.  Breast cancer screening is essential preventive care for women. You should practice "breast self-awareness." This means understanding the normal appearance and feel of your breasts and may include breast self-examination. Any changes detected, no matter how small, should be reported to a health care provider. Women in their 64s and 30s should have a clinical breast exam (CBE) by a health care provider as part of a regular health exam every 1 to 3 years. After age 53, women should have a CBE every year. Starting at age 31, women should consider having a mammogram (breast X-ray test) every year. Women who have a family history of breast cancer should talk to their health care provider about genetic screening. Women at a high risk of breast cancer should talk to their health care providers about having an MRI and a mammogram every year.  Breast cancer gene (BRCA)-related cancer risk assessment is recommended for women who have family members with BRCA-related cancers. BRCA-related cancers include breast, ovarian, tubal, and peritoneal cancers. Having family members with these cancers may be associated with an increased risk for harmful changes (mutations) in the breast cancer genes BRCA1 and BRCA2. Results of the assessment will determine the need for genetic counseling and BRCA1 and BRCA2 testing.  Routine pelvic exams to screen for cancer are no longer recommended for nonpregnant women who are considered low risk for cancer of the pelvic organs (ovaries, uterus, and vagina) and who do not have symptoms. Ask your health care provider if a screening pelvic exam is right for you.  If you have had past treatment for cervical cancer  or a condition that could lead to cancer, you need Pap tests and screening for cancer for at least 20 years after your treatment. If Pap tests have been discontinued, your risk factors (such as having a new sexual partner) need to be reassessed to determine if screening should be resumed. Some women have medical problems that increase the chance of getting cervical cancer. In these cases, your health care provider may recommend more frequent screening and Pap tests.    Colorectal cancer can be detected and often prevented. Most routine colorectal cancer screening begins at the age of 80 years and continues through age 104 years. However, your health care provider may recommend screening at an earlier age if you have risk factors for colon cancer. On a yearly basis, your health care provider may provide home test kits to check for hidden blood in the stool. Use of a small camera at the end of a tube, to directly examine the colon (sigmoidoscopy or colonoscopy), can detect the earliest forms of colorectal cancer. Talk to your health care provider about this at  age 80, when routine screening begins. Direct exam of the colon should be repeated every 5-10 years through age 70 years, unless early forms of pre-cancerous polyps or small growths are found.  Osteoporosis is a disease in which the bones lose minerals and strength with aging. This can result in serious bone fractures or breaks. The risk of osteoporosis can be identified using a bone density scan. Women ages 30 years and over and women at risk for fractures or osteoporosis should discuss screening with their health care providers. Ask your health care provider whether you should take a calcium supplement or vitamin D to reduce the rate of osteoporosis.  Menopause can be associated with physical symptoms and risks. Hormone replacement therapy is available to decrease symptoms and risks. You should talk to your health care provider about whether hormone  replacement therapy is right for you.  Use sunscreen. Apply sunscreen liberally and repeatedly throughout the day. You should seek shade when your shadow is shorter than you. Protect yourself by wearing long sleeves, pants, a wide-brimmed hat, and sunglasses year round, whenever you are outdoors.  Once a month, do a whole body skin exam, using a mirror to look at the skin on your back. Tell your health care provider of new moles, moles that have irregular borders, moles that are larger than a pencil eraser, or moles that have changed in shape or color.  Stay current with required vaccines (immunizations).  Influenza vaccine. All adults should be immunized every year.  Tetanus, diphtheria, and acellular pertussis (Td, Tdap) vaccine. Pregnant women should receive 1 dose of Tdap vaccine during each pregnancy. The dose should be obtained regardless of the length of time since the last dose. Immunization is preferred during the 27th-36th week of gestation. An adult who has not previously received Tdap or who does not know her vaccine status should receive 1 dose of Tdap. This initial dose should be followed by tetanus and diphtheria toxoids (Td) booster doses every 10 years. Adults with an unknown or incomplete history of completing a 3-dose immunization series with Td-containing vaccines should begin or complete a primary immunization series including a Tdap dose. Adults should receive a Td booster every 10 years.    Zoster vaccine. One dose is recommended for adults aged 38 years or older unless certain conditions are present.    Pneumococcal 13-valent conjugate (PCV13) vaccine. When indicated, a person who is uncertain of her immunization history and has no record of immunization should receive the PCV13 vaccine. An adult aged 44 years or older who has certain medical conditions and has not been previously immunized should receive 1 dose of PCV13 vaccine. This PCV13 should be followed with a dose of  pneumococcal polysaccharide (PPSV23) vaccine. The PPSV23 vaccine dose should be obtained at least 8 weeks after the dose of PCV13 vaccine. An adult aged 10 years or older who has certain medical conditions and previously received 1 or more doses of PPSV23 vaccine should receive 1 dose of PCV13. The PCV13 vaccine dose should be obtained 1 or more years after the last PPSV23 vaccine dose.    Pneumococcal polysaccharide (PPSV23) vaccine. When PCV13 is also indicated, PCV13 should be obtained first. All adults aged 37 years and older should be immunized. An adult younger than age 25 years who has certain medical conditions should be immunized. Any person who resides in a nursing home or long-term care facility should be immunized. An adult smoker should be immunized. People with an immunocompromised condition and certain  other conditions should receive both PCV13 and PPSV23 vaccines. People with human immunodeficiency virus (HIV) infection should be immunized as soon as possible after diagnosis. Immunization during chemotherapy or radiation therapy should be avoided. Routine use of PPSV23 vaccine is not recommended for American Indians, [place] Natives, or people younger than 65 years unless there are medical conditions that require PPSV23 vaccine. When indicated, people who have unknown immunization and have no record of immunization should receive PPSV23 vaccine. One-time revaccination 5 years after the first dose of PPSV23 is recommended for people aged 19-64 years who have chronic kidney failure, nephrotic syndrome, asplenia, or immunocompromised conditions. People who received 1-2 doses of PPSV23 before age 53 years should receive another dose of PPSV23 vaccine at age 4 years or later if at least 5 years have passed since the previous dose. Doses of PPSV23 are not needed for people immunized with PPSV23 at or after age 79 years.   Preventive Services / Frequency  Ages 61 years and over  Blood pressure  check.  Lipid and cholesterol check.  Lung cancer screening. / Every year if you are aged 52-80 years and have a 30-pack-year history of smoking and currently smoke or have quit within the past 15 years. Yearly screening is stopped once you have quit smoking for at least 15 years or develop a health problem that would prevent you from having lung cancer treatment.  Clinical breast exam.** / Every year after age 86 years.  BRCA-related cancer risk assessment.** / For women who have family members with a BRCA-related cancer (breast, ovarian, tubal, or peritoneal cancers).  Mammogram.** / Every year beginning at age 88 years and continuing for as long as you are in good health. Consult with your health care provider.  Pap test.** / Every 3 years starting at age 43 years through age 79 or 57 years with 3 consecutive normal Pap tests. Testing can be stopped between 65 and 70 years with 3 consecutive normal Pap tests and no abnormal Pap or HPV tests in the past 10 years.  Fecal occult blood test (FOBT) of stool. / Every year beginning at age 45 years and continuing until age 56 years. You may not need to do this test if you get a colonoscopy every 10 years.  Flexible sigmoidoscopy or colonoscopy.** / Every 5 years for a flexible sigmoidoscopy or every 10 years for a colonoscopy beginning at age 81 years and continuing until age 74 years.  Hepatitis C blood test.** / For all people born from [birth year] through 1967 and any individual with known risks for hepatitis C.  Osteoporosis screening.** / A one-time screening for women ages 75 years and over and women at risk for fractures or osteoporosis.  Skin self-exam. / Monthly.  Influenza vaccine. / Every year.  Tetanus, diphtheria, and acellular pertussis (Tdap/Td) vaccine.** / 1 dose of Td every 10 years.  Zoster vaccine.** / 1 dose for adults aged 49 years or older.  Pneumococcal 13-valent conjugate (PCV13) vaccine.** / Consult your health care  provider.  Pneumococcal polysaccharide (PPSV23) vaccine.** / 1 dose for all adults aged 94 years and older. Screening for abdominal aortic aneurysm (AAA)  by ultrasound is recommended for people who have history of high blood pressure or who are current or former smokers.

## 2015-03-29 LAB — URINALYSIS, ROUTINE W REFLEX MICROSCOPIC
BILIRUBIN URINE: NEGATIVE
GLUCOSE, UA: NEGATIVE mg/dL
HGB URINE DIPSTICK: NEGATIVE
Leukocytes, UA: NEGATIVE
NITRITE: NEGATIVE
PROTEIN: NEGATIVE mg/dL
Specific Gravity, Urine: <1.005 — ABNORMAL LOW (ref 1.005–1.030)
Urobilinogen, UA: 0.2 mg/dL (ref 0.0–1.0)
pH: 5 (ref 5.0–8.0)

## 2015-03-29 LAB — MICROALBUMIN / CREATININE URINE RATIO
CREATININE, URINE: 134.9 mg/dL
MICROALB UR: 1.3 mg/dL (ref <2.0)
MICROALB/CREAT RATIO: 9.6 mg/g (ref 0.0–30.0)

## 2015-03-29 LAB — VITAMIN D 25 HYDROXY (VIT D DEFICIENCY, FRACTURES): Vit D, 25-Hydroxy: 24 ng/mL — ABNORMAL LOW (ref 30–100)

## 2015-03-31 ENCOUNTER — Ambulatory Visit (HOSPITAL_COMMUNITY)
Admission: RE | Admit: 2015-03-31 | Discharge: 2015-03-31 | Disposition: A | Discharge status: Home / Self Care | Payer: Medicare Other | Source: Ambulatory Visit | Attending: Orthopaedic Surgery | Admitting: Orthopaedic Surgery

## 2015-03-31 DIAGNOSIS — M4712 Other spondylosis with myelopathy, cervical region: Secondary | ICD-10-CM | POA: Diagnosis not present

## 2015-03-31 DIAGNOSIS — M47812 Spondylosis without myelopathy or radiculopathy, cervical region: Secondary | ICD-10-CM | POA: Diagnosis not present

## 2015-03-31 DIAGNOSIS — Z853 Personal history of malignant neoplasm of breast: Secondary | ICD-10-CM | POA: Insufficient documentation

## 2015-03-31 DIAGNOSIS — R200 Anesthesia of skin: Secondary | ICD-10-CM | POA: Diagnosis not present

## 2015-03-31 DIAGNOSIS — M542 Cervicalgia: Secondary | ICD-10-CM

## 2015-03-31 DIAGNOSIS — M5412 Radiculopathy, cervical region: Secondary | ICD-10-CM | POA: Diagnosis not present

## 2015-03-31 DIAGNOSIS — M5032 Other cervical disc degeneration, mid-cervical region: Secondary | ICD-10-CM | POA: Diagnosis not present

## 2015-03-31 DIAGNOSIS — G8191 Hemiplegia, unspecified affecting right dominant side: Secondary | ICD-10-CM | POA: Insufficient documentation

## 2015-04-07 ENCOUNTER — Ambulatory Visit
Admission: RE | Admit: 2015-04-07 | Discharge: 2015-04-07 | Disposition: A | Discharge status: Home / Self Care | Payer: Medicare Other | Source: Ambulatory Visit | Attending: Physician Assistant | Admitting: Physician Assistant

## 2015-04-07 DIAGNOSIS — M810 Age-related osteoporosis without current pathological fracture: Secondary | ICD-10-CM | POA: Diagnosis not present

## 2015-04-07 DIAGNOSIS — Z853 Personal history of malignant neoplasm of breast: Secondary | ICD-10-CM | POA: Diagnosis not present

## 2015-06-04 ENCOUNTER — Other Ambulatory Visit: Payer: Self-pay | Admitting: Family Medicine

## 2015-06-09 ENCOUNTER — Encounter: Payer: Self-pay | Admitting: Gastroenterology

## 2015-06-30 DIAGNOSIS — M542 Cervicalgia: Secondary | ICD-10-CM | POA: Diagnosis not present

## 2015-06-30 DIAGNOSIS — M5412 Radiculopathy, cervical region: Secondary | ICD-10-CM | POA: Diagnosis not present

## 2015-06-30 DIAGNOSIS — M4712 Other spondylosis with myelopathy, cervical region: Secondary | ICD-10-CM | POA: Diagnosis not present

## 2015-07-03 ENCOUNTER — Other Ambulatory Visit: Payer: Self-pay | Admitting: Family Medicine

## 2015-07-13 DIAGNOSIS — C50911 Malignant neoplasm of unspecified site of right female breast: Secondary | ICD-10-CM | POA: Diagnosis not present

## 2015-07-14 ENCOUNTER — Ambulatory Visit: Payer: Medicare Other | Admitting: Family Medicine

## 2015-07-14 ENCOUNTER — Encounter: Payer: Self-pay | Admitting: Family Medicine

## 2015-07-14 ENCOUNTER — Ambulatory Visit (INDEPENDENT_AMBULATORY_CARE_PROVIDER_SITE_OTHER): Payer: Medicare Other | Admitting: Family Medicine

## 2015-07-14 VITALS — BP 135/70 | HR 86 | Temp 97.5°F | Ht 62.0 in | Wt 106.0 lb

## 2015-07-14 DIAGNOSIS — I10 Essential (primary) hypertension: Secondary | ICD-10-CM

## 2015-07-14 MED ORDER — AMLODIPINE BESYLATE 5 MG PO TABS: 5.0000 mg | ORAL_TABLET | Freq: Every day | ORAL | Status: DC

## 2015-07-14 NOTE — Progress Notes (Signed)
BP 135/70 mmHg  Pulse 86  Temp(Src) 97.5 F (36.4 C) (Oral)  Ht _0  (1.575 m)  Wt 106 lb (48.081 kg)  BMI 19.38 kg/m2   Subjective:    Patient ID: Crystal [name], female    DOB: 06/15/1946, 69 y.o.   MRN: [id number]  HPI: Crystal [name] is a 69 y.o. female presenting on 07/14/2015 for Medication Refill   HPI Hypertension Patient presents today for hypertension recheck. Her blood pressure today is 135/70. She says this is higher than it normally runs. Patient denies headaches, blurred vision, chest pains, shortness of breath, or weakness. Denies any side effects from medication and is content with current medication.  Relevant past medical, surgical, family and social history reviewed and updated as indicated. Interim medical history since our last visit reviewed. Allergies and medications reviewed and updated.  Review of Systems  Constitutional: Negative for fever and chills.  HENT: Negative for congestion, ear discharge and ear pain.   Eyes: Negative for redness and visual disturbance.  Respiratory: Negative for chest tightness and shortness of breath.   Cardiovascular: Negative for chest pain, palpitations and leg swelling.  Genitourinary: Negative for dysuria and difficulty urinating.  Musculoskeletal: Negative for back pain and gait problem.  Skin: Negative for rash.  Neurological: Negative for dizziness, light-headedness and headaches.  Psychiatric/Behavioral: Negative for behavioral problems and agitation.  All other systems reviewed and are negative.   Per HPI unless specifically indicated above     Medication List       This list is accurate as of: 07/14/15 10:57 AM.  Always use your most recent med list.               amLODipine 5 MG tablet  Commonly known as:  NORVASC  Take 1 tablet (5 mg total) by mouth daily.     aspirin 325 MG EC tablet  Take 325 mg by mouth daily.     polyethylene glycol packet  Commonly known as:  MIRALAX / GLYCOLAX    Take 17 g by mouth 3 (three) times a week.           Objective:    BP 135/70 mmHg  Pulse 86  Temp(Src) 97.5 F (36.4 C) (Oral)  Ht _1  (1.575 m)  Wt 106 lb (48.081 kg)  BMI 19.38 kg/m2  Wt Readings from Last 3 Encounters:  07/14/15 106 lb (48.081 kg)  03/31/15 103 lb (46.72 kg)  03/28/15 103 lb (46.72 kg)    Physical Exam  Constitutional: She is oriented to person, place, and time. She appears well-developed and well-nourished. No distress.  Eyes: Conjunctivae and EOM are normal. Pupils are equal, round, and reactive to light.  Neck: Neck supple. No thyromegaly present.  Cardiovascular: Normal rate and regular rhythm.   No murmur heard. Pulmonary/Chest: Effort normal and breath sounds normal. No respiratory distress. She has no wheezes.  Musculoskeletal: Normal range of motion. She exhibits no edema or tenderness.  Lymphadenopathy:    She has no cervical adenopathy.  Neurological: She is alert and oriented to person, place, and time. Coordination normal.  Skin: Skin is warm and dry. No rash noted. She is not diaphoretic.  Psychiatric: She has a normal mood and affect. Her behavior is normal.  Vitals reviewed.   Results for orders placed or performed in visit on 03/28/15  CBC with Differential/Platelet  Result Value Ref Range   WBC 8.0 4.0 - 10.5 K/uL   RBC 4.23 3.87 - 5.11 MIL/uL  Hemoglobin 13.0 12.0 - 15.0 g/dL   HCT 38.7 36.0 - 46.0 %   MCV 91.5 78.0 - 100.0 fL   MCH 30.7 26.0 - 34.0 pg   MCHC 33.6 30.0 - 36.0 g/dL   RDW 12.7 11.5 - 15.5 %   Platelets 271 150 - 400 K/uL   MPV 10.2 8.6 - 12.4 fL   Neutrophils Relative % 79 (H) 43 - 77 %   Neutro Abs 6.3 1.7 - 7.7 K/uL   Lymphocytes Relative 14 12 - 46 %   Lymphs Abs 1.1 0.7 - 4.0 K/uL   Monocytes Relative 6 3 - 12 %   Monocytes Absolute 0.5 0.1 - 1.0 K/uL   Eosinophils Relative 1 0 - 5 %   Eosinophils Absolute 0.1 0.0 - 0.7 K/uL   Basophils Relative 0 0 - 1 %   Basophils Absolute 0.0 0.0 - 0.1 K/uL    Smear Review Criteria for review not met   BASIC METABOLIC PANEL WITH GFR  Result Value Ref Range   Sodium 141 135 - 145 mEq/L   Potassium 4.4 3.5 - 5.3 mEq/L   Chloride 102 96 - 112 mEq/L   CO2 29 19 - 32 mEq/L   Glucose, Bld 85 70 - 99 mg/dL   BUN 13 6 - 23 mg/dL   Creat 0.69 0.50 - 1.10 mg/dL   Calcium 9.9 8.4 - 10.5 mg/dL   GFR, Est African American >89 mL/min   GFR, Est Non African American 88 mL/min  Hepatic function panel  Result Value Ref Range   Total Bilirubin 1.2 0.2 - 1.2 mg/dL   Bilirubin, Direct 0.2 0.0 - 0.3 mg/dL   Indirect Bilirubin 1.0 0.2 - 1.2 mg/dL   Alkaline Phosphatase 65 39 - 117 U/L   AST 20 0 - 37 U/L   ALT 13 0 - 35 U/L   Total Protein 6.9 6.0 - 8.3 g/dL   Albumin 4.4 3.5 - 5.2 g/dL  TSH  Result Value Ref Range   TSH 1.403 0.350 - 4.500 uIU/mL  Lipid panel  Result Value Ref Range   Cholesterol 233 (H) 0 - 200 mg/dL   Triglycerides 71 <150 mg/dL   HDL 85 >=46 mg/dL   Total CHOL/HDL Ratio 2.7 Ratio   VLDL 14 0 - 40 mg/dL   LDL Cholesterol 134 (H) 0 - 99 mg/dL  Magnesium  Result Value Ref Range   Magnesium 2.2 1.5 - 2.5 mg/dL  Vit D  25 hydroxy (rtn osteoporosis monitoring)  Result Value Ref Range   Vit D, 25-Hydroxy 24 (L) 30 - 100 ng/mL  Urinalysis, Routine w reflex microscopic (not at [facility])  Result Value Ref Range   Color, Urine YELLOW YELLOW   APPearance CLEAR CLEAR   Specific Gravity, Urine <1.005 (L) 1.005 - 1.030   pH 5.0 5.0 - 8.0   Glucose, UA NEG NEG mg/dL   Bilirubin Urine NEG NEG   Ketones, ur TRACE (A) NEG mg/dL   Hgb urine dipstick NEG NEG   Protein, ur NEG NEG mg/dL   Urobilinogen, UA 0.2 0.0 - 1.0 mg/dL   Nitrite NEG NEG   Leukocytes, UA NEG NEG  Microalbumin / creatinine urine ratio  Result Value Ref Range   Microalb, Ur 1.3 <2.0 mg/dL   Creatinine, Urine 134.9 mg/dL   Microalb Creat Ratio 9.6 0.0 - 30.0 mg/g      Assessment & Plan:   Problem List Items Addressed This Visit      Cardiovascular and Mediastinum  Essential hypertension - Primary    Patient hypertension is controlled on Norvasc. She denies any side effects and will continue Norvasc for now.      Relevant Medications   amLODipine (NORVASC) 5 MG tablet       Follow up plan: Return in about 6 months (around 01/11/2016), or if symptoms worsen or fail to improve, for Hypertension.  Caryl Pina, MD [facility] 07/14/2015, 10:57 AM

## 2015-07-14 NOTE — Assessment & Plan Note (Signed)
Patient hypertension is controlled on Norvasc. She denies any side effects and will continue Norvasc for now.

## 2015-07-22 ENCOUNTER — Ambulatory Visit (INDEPENDENT_AMBULATORY_CARE_PROVIDER_SITE_OTHER): Payer: Medicare Other

## 2015-07-22 DIAGNOSIS — Z23 Encounter for immunization: Secondary | ICD-10-CM

## 2015-09-26 ENCOUNTER — Ambulatory Visit (INDEPENDENT_AMBULATORY_CARE_PROVIDER_SITE_OTHER): Payer: Medicare Other | Admitting: Family Medicine

## 2015-09-26 ENCOUNTER — Encounter: Payer: Self-pay | Admitting: Family Medicine

## 2015-09-26 VITALS — BP 135/82 | HR 90 | Temp 98.6°F | Ht 62.0 in | Wt 108.0 lb

## 2015-09-26 DIAGNOSIS — N3941 Urge incontinence: Secondary | ICD-10-CM | POA: Diagnosis not present

## 2015-09-26 DIAGNOSIS — R0981 Nasal congestion: Secondary | ICD-10-CM

## 2015-09-26 MED ORDER — FLUTICASONE PROPIONATE 50 MCG/ACT NA SUSP: 1.0000 | Freq: Two times a day (BID) | NASAL | Status: DC | PRN

## 2015-09-26 MED ORDER — MIRABEGRON ER 25 MG PO TB24: 25.0000 mg | ORAL_TABLET | Freq: Every day | ORAL | Status: DC

## 2015-09-26 NOTE — Progress Notes (Signed)
BP 135/82 mmHg  Pulse 90  Temp(Src) 98.6 F (37 C) (Oral)  Ht 5\' 2"  (1.575 m)  Wt 108 lb (48.988 kg)  BMI 19.75 kg/m2   Subjective:    Patient ID: Crystal [name], female    DOB: [birth year], 69 y.o.   MRN: [id number]  HPI: Crystal [name] is a 69 y.o. female presenting on 09/26/2015 for cough, chest feels tight and Sinusitis   HPI Sinus congestion Patient is been having nasal sinus congestion and postnasal drainage for the past 3 days. She denies any fevers or chills. She has not tried anything over-the-counter yet. She has cough with that as well but the cough is nonproductive. She denies any shortness of breath or wheezing. She does not know of any sick contacts.  Urge incontinence Patient has been having difficulties with her bladder and incontinence. She gets incontinent if she tries to hold it too long and it will start to leak out. She also will use the restroom and then feel like she has to urinate again after but then will have much left. She says she has to go frequently during the middle of the night and during the day. She denies any burning or pain when she urinates. She denies any decrease in urine output. She denies any change in color or odor to her urine as well. She denies any abdominal pain  Relevant past medical, surgical, family and social history reviewed and updated as indicated. Interim medical history since our last visit reviewed. Allergies and medications reviewed and updated.  Review of Systems  Constitutional: Negative for fever and chills.  HENT: Positive for congestion, postnasal drip, rhinorrhea, sinus pressure and sore throat. Negative for ear discharge, ear pain and sneezing.   Eyes: Negative for pain, redness and visual disturbance.  Respiratory: Positive for cough. Negative for chest tightness and shortness of breath.   Cardiovascular: Negative for chest pain and leg swelling.  Gastrointestinal: Negative for abdominal pain.  Genitourinary:  Positive for urgency and frequency. Negative for dysuria, hematuria, flank pain, decreased urine volume, difficulty urinating and pelvic pain.  Musculoskeletal: Negative for back pain and gait problem.  Skin: Negative for rash.  Neurological: Negative for light-headedness and headaches.  Psychiatric/Behavioral: Negative for behavioral problems and agitation.  All other systems reviewed and are negative.   Per HPI unless specifically indicated above     Medication List       This list is accurate as of: 09/26/15  4:21 PM.  Always use your most recent med list.               amLODipine 5 MG tablet  Commonly known as:  NORVASC  Take 1 tablet (5 mg total) by mouth daily.     aspirin 325 MG EC tablet  Take 325 mg by mouth daily.     fluticasone 50 MCG/ACT nasal spray  Commonly known as:  FLONASE  Place 1 spray into both nostrils 2 (two) times daily as needed for allergies or rhinitis.     mirabegron ER 25 MG Tb24 tablet  Commonly known as:  MYRBETRIQ  Take 1 tablet (25 mg total) by mouth daily.     polyethylene glycol packet  Commonly known as:  MIRALAX / GLYCOLAX  Take 17 g by mouth 3 (three) times a week.           Objective:    BP 135/82 mmHg  Pulse 90  Temp(Src) 98.6 F (37 C) (Oral)  Ht 5\' 2"  (1.575  m)  Wt 108 lb (48.988 kg)  BMI 19.75 kg/m2  Wt Readings from Last 3 Encounters:  09/26/15 108 lb (48.988 kg)  07/14/15 106 lb (48.081 kg)  03/31/15 103 lb (46.72 kg)    Physical Exam  Constitutional: She is oriented to person, place, and time. She appears well-developed and well-nourished. No distress.  HENT:  Right Ear: Tympanic membrane, external ear and ear canal normal.  Left Ear: Tympanic membrane, external ear and ear canal normal.  Nose: Mucosal edema and rhinorrhea present. No epistaxis. Right sinus exhibits no maxillary sinus tenderness and no frontal sinus tenderness. Left sinus exhibits no maxillary sinus tenderness and no frontal sinus tenderness.    Mouth/Throat: Uvula is midline and mucous membranes are normal. Posterior oropharyngeal edema and posterior oropharyngeal erythema present. No oropharyngeal exudate or tonsillar abscesses.  Eyes: Conjunctivae and EOM are normal.  Neck: Neck supple. No thyromegaly present.  Cardiovascular: Normal rate, regular rhythm, normal heart sounds and intact distal pulses.   No murmur heard. Pulmonary/Chest: Effort normal and breath sounds normal. No respiratory distress. She has no wheezes.  Abdominal: Soft. Bowel sounds are normal. She exhibits no distension and no mass. There is no tenderness. There is no rebound and no guarding.  Musculoskeletal: Normal range of motion. She exhibits no edema or tenderness.  Lymphadenopathy:    She has no cervical adenopathy.  Neurological: She is alert and oriented to person, place, and time. Coordination normal.  Skin: Skin is warm and dry. No rash noted. She is not diaphoretic.  Psychiatric: She has a normal mood and affect. Her behavior is normal.  Vitals reviewed.       Assessment & Plan:   Problem List Items Addressed This Visit    None    Visit Diagnoses    Nasal sinus congestion    -  Primary    Relevant Medications    fluticasone (FLONASE) 50 MCG/ACT nasal spray    Urge incontinence of urine        Relevant Medications    mirabegron ER (MYRBETRIQ) 25 MG TB24 tablet        Follow up plan: Return in about 3 months (around 12/25/2015), or if symptoms worsen or fail to improve, for Recheck on urgent continence.  Counseling provided for all of the vaccine components No orders of the defined types were placed in this encounter.    Caryl Pina, MD [facility] 09/26/2015, 4:21 PM

## 2015-09-26 NOTE — Addendum Note (Signed)
Addended by: Michaela Corner on: 09/26/2015 05:09 PM   Modules accepted: Orders

## 2015-10-03 ENCOUNTER — Other Ambulatory Visit: Payer: Self-pay

## 2015-10-03 DIAGNOSIS — Z1231 Encounter for screening mammogram for malignant neoplasm of breast: Secondary | ICD-10-CM

## 2015-10-11 ENCOUNTER — Ambulatory Visit (INDEPENDENT_AMBULATORY_CARE_PROVIDER_SITE_OTHER): Payer: Medicare Other | Admitting: Internal Medicine

## 2015-10-11 ENCOUNTER — Encounter: Payer: Self-pay | Admitting: Internal Medicine

## 2015-10-11 VITALS — BP 138/82 | HR 88 | Temp 97.3°F | Resp 16 | Ht 62.0 in | Wt 106.4 lb

## 2015-10-11 DIAGNOSIS — Z79899 Other long term (current) drug therapy: Secondary | ICD-10-CM | POA: Diagnosis not present

## 2015-10-11 DIAGNOSIS — R7309 Other abnormal glucose: Secondary | ICD-10-CM

## 2015-10-11 DIAGNOSIS — E785 Hyperlipidemia, unspecified: Secondary | ICD-10-CM

## 2015-10-11 DIAGNOSIS — E559 Vitamin D deficiency, unspecified: Secondary | ICD-10-CM

## 2015-10-11 DIAGNOSIS — I10 Essential (primary) hypertension: Secondary | ICD-10-CM | POA: Diagnosis not present

## 2015-10-11 DIAGNOSIS — R1900 Intra-abdominal and pelvic swelling, mass and lump, unspecified site: Secondary | ICD-10-CM

## 2015-10-11 LAB — HEPATIC FUNCTION PANEL
ALT: 13 U/L (ref 6–29)
AST: 20 U/L (ref 10–35)
Albumin: 4.6 g/dL (ref 3.6–5.1)
Alkaline Phosphatase: 84 U/L (ref 33–130)
Bilirubin, Direct: 0.2 mg/dL (ref <=0.2)
Indirect Bilirubin: 0.8 mg/dL (ref 0.2–1.2)
TOTAL PROTEIN: 7 g/dL (ref 6.1–8.1)
Total Bilirubin: 1 mg/dL (ref 0.2–1.2)

## 2015-10-11 LAB — CBC WITH DIFFERENTIAL/PLATELET
Basophils Absolute: 0.1 10*3/uL (ref 0.0–0.1)
Basophils Relative: 1 % (ref 0–1)
Eosinophils Absolute: 0.1 10*3/uL (ref 0.0–0.7)
Eosinophils Relative: 1 % (ref 0–5)
HEMATOCRIT: 38 % (ref 36.0–46.0)
Hemoglobin: 12.9 g/dL (ref 12.0–15.0)
Lymphocytes Relative: 12 % (ref 12–46)
Lymphs Abs: 1 10*3/uL (ref 0.7–4.0)
MCH: 31.2 pg (ref 26.0–34.0)
MCHC: 33.9 g/dL (ref 30.0–36.0)
MCV: 91.8 fL (ref 78.0–100.0)
MONO ABS: 0.4 10*3/uL (ref 0.1–1.0)
MPV: 10 fL (ref 8.6–12.4)
Monocytes Relative: 5 % (ref 3–12)
NEUTROS ABS: 6.5 10*3/uL (ref 1.7–7.7)
Neutrophils Relative %: 81 % — ABNORMAL HIGH (ref 43–77)
Platelets: 320 10*3/uL (ref 150–400)
RBC: 4.14 MIL/uL (ref 3.87–5.11)
RDW: 12.2 % (ref 11.5–15.5)
WBC: 8 10*3/uL (ref 4.0–10.5)

## 2015-10-11 LAB — BASIC METABOLIC PANEL WITH GFR
BUN: 13 mg/dL (ref 7–25)
CALCIUM: 9.8 mg/dL (ref 8.6–10.4)
CHLORIDE: 103 mmol/L (ref 98–110)
CO2: 28 mmol/L (ref 20–31)
CREATININE: 0.66 mg/dL (ref 0.60–0.93)
GFR, Est African American: >89 mL/min (ref >=60)
GFR, Est Non African American: 89 mL/min (ref >=60)
GLUCOSE: 101 mg/dL — AB (ref 65–99)
Potassium: 4.7 mmol/L (ref 3.5–5.3)
SODIUM: 142 mmol/L (ref 135–146)

## 2015-10-11 LAB — LIPID PANEL
Cholesterol: 234 mg/dL — ABNORMAL HIGH (ref 125–200)
HDL: 73 mg/dL (ref >=46)
LDL Cholesterol: 144 mg/dL — ABNORMAL HIGH (ref <130)
Total CHOL/HDL Ratio: 3.2 Ratio (ref <=5.0)
Triglycerides: 86 mg/dL (ref <150)
VLDL: 17 mg/dL (ref <30)

## 2015-10-11 LAB — HEMOGLOBIN A1C
HEMOGLOBIN A1C: 5.5 % (ref <5.7)
MEAN PLASMA GLUCOSE: 111 mg/dL (ref <117)

## 2015-10-11 LAB — TSH: TSH: 1.519 u[IU]/mL (ref 0.350–4.500)

## 2015-10-11 LAB — MAGNESIUM: MAGNESIUM: 2.2 mg/dL (ref 1.5–2.5)

## 2015-10-11 NOTE — Progress Notes (Signed)
Patient ID: Crystal [name], female   DOB: [birth year], 69 y.o.   MRN: [id number]   This very nice 69 y.o. [facility] presents for 6 month follow up with Hypertension, Hyperlipidemia, Pre-Diabetes and Vitamin D Deficiency. Patient has hx/o R Breast Cancer in 2013 followed by Dr Jana Hakim. Today patient is also c/o a cord-like vertical thickening in her R lateral abdomen or flank which is mobile and non tender.    Patient is treated for HTN since 2004 when she had a TIA and has done well since with BP controlled at home. Today's BP: 138/82 mmHg. Patient has had no complaints of any cardiac type chest pain, palpitations, dyspnea/orthopnea/PND, dizziness, claudication, neurologic sx's or dependent edema.   Patient is attempting to control her Hyperlipidemia with diet. Patient denies myalgias or other med SE's. Last Lipids were not at goal with Cholesterol 233*; HDL 85; LDL 134*; Triglycerides 71 on 03/28/2015.   Also, the patient is screened for PreDiabetes and has had no symptoms of reactive hypoglycemia, diabetic polys, paresthesias or visual blurring.  Last A1c was 5.5% in June 2015.   Further, the patient also has history of Vitamin D Deficiency and supplements vitamin D without any suspected side-effects. Last vitamin D was 24 on 03/28/2015.  Medication Sig  . amLODipine  5 MG tablet Take 1 tablet (5 mg total) by mouth daily.  Marland Kitchen aspirin 325 MG EC tablet Take 325 mg by mouth daily.  Marland Kitchen MIRALAX Take 17 g by mouth 3 (three) times a week.  Marland Kitchen FLONASE nasal spray Place 1 spray  both nostrils 2 times daily   . MYRBETRIQ 25 MG TB24 Take 1 tablet (25 mg total) by mouth daily.   No Known Allergies  PMHx:   Past Medical History  Diagnosis Date  . Abdominal aortic ectasia ([place]) 01/31/2010    Qualifier: Diagnosis of  By: [name] MD, [place] DISEASE 06/16/2010    Qualifier: Diagnosis of  By: [name] MD, [place]   . CONSTIPATION 01/12/2009    Qualifier: Diagnosis of  By: [name] CMA ([place]), Mearl Latin     . ESOPHAGEAL STRICTURE 01/12/2009    Qualifier: Diagnosis of  By: [name] CMA ([place]), Mearl Latin    . EXTERNAL HEMORRHOIDS 01/12/2009    Qualifier: Diagnosis of  By: [name] CMA ([place]), Mearl Latin    . GERD 05/10/2007    Qualifier: Diagnosis of  By: [name] RMA, [place]    . HYPERLIPIDEMIA 05/10/2007    Qualifier: Diagnosis of  By: [name], Lucy    . HYPERTENSION 05/10/2007    Qualifier: Diagnosis of  By: [name], Lucy    . Irritable bowel syndrome 01/12/2009    Qualifier: Diagnosis of  By: [name] CMA ([facility]), Mearl Latin    . PERIPHERAL VASCULAR DISEASE 06/17/2010    Qualifier: Diagnosis of  By: [name] MD, [place] 02/16/2009    Qualifier: Diagnosis of  By: [name] CMA ([place]), Mearl Latin    . STENOSIS, RECTAL 02/16/2009    Qualifier: Diagnosis of  By: [name] CMA ([place]), Mearl Latin    . Tachyarrhythmia 09/03/2009    Qualifier: Diagnosis of  By: [name] MD, [place] ISCHEMIC ATTACK 03/02/2010    Qualifier: Diagnosis of  By: [name] MD, [name] VENOUS INSUFFICIENCY, LEGS 07/18/2010    Qualifier: Diagnosis of  By: [name] MD, [name]   . Eosinophilic esophagitis   . Orthostatic hypotension   . Overactive detrusor   . Adjustment  disorder with depressed mood   . Breast cancer ([place]) 03/11/12    right breast lumpectomy=invasive lobular ca,grade I/III,ER?PR=positive  . Radiation 04/21/2012-06/11/2012    64 gray right breast  . Melanosis   . Rectal stenosis   . Anxiety   . IBS (irritable bowel syndrome)   . Incontinence    Immunization History  Administered Date(s) Administered  . Influenza Split 08/22/2011, 06/30/2012  . Influenza Whole 08/13/2008, 08/03/2009, 07/18/2010  . Influenza,inj,Quad PF,36+ Mos 07/28/2013, 07/22/2015  . Pneumococcal Conjugate-13 10/20/2013  . Pneumococcal Polysaccharide-23 08/13/2008  . Pneumococcal-Unspecified 11/12/2009  . Td 01/31/2010  . Tdap 08/22/2011   Past Surgical History  Procedure Laterality Date  . Abdominal hysterectomy    .  Hemmorhoidectomy      x 3  . Cardiac catherization    . Esophageal tumor excised; posterior 1986    . Electrocardiogram  10/30/2006  . Edg  04/21/2007  . Breast surgery Left     left breast x 2  . Cardiac catheterization    . Breast lumpectomy Right    FHx:    Reviewed / unchanged  SHx:    Reviewed / unchanged  Systems Review:  Constitutional: Denies fever, chills, wt changes, headaches, insomnia, fatigue, night sweats, change in appetite. Eyes: Denies redness, blurred vision, diplopia, discharge, itchy, watery eyes.  ENT: Denies discharge, congestion, post nasal drip, epistaxis, sore throat, earache, hearing loss, dental pain, tinnitus, vertigo, sinus pain, snoring.  CV: Denies chest pain, palpitations, irregular heartbeat, syncope, dyspnea, diaphoresis, orthopnea, PND, claudication or edema. Respiratory: denies cough, dyspnea, DOE, pleurisy, hoarseness, laryngitis, wheezing.  Gastrointestinal: Denies dysphagia, odynophagia, heartburn, reflux, water brash, abdominal pain or cramps, nausea, vomiting, bloating, diarrhea, constipation, hematemesis, melena, hematochezia  or hemorrhoids. Genitourinary: Denies dysuria, frequency, urgency, nocturia, hesitancy, discharge, hematuria or flank pain. Musculoskeletal: Denies arthralgias, myalgias, stiffness, jt. swelling, pain, limping or strain/sprain.  Skin: Denies pruritus, rash, hives, warts, acne, eczema or change in skin lesion(s). Neuro: No weakness, tremor, incoordination, spasms, paresthesia or pain. Psychiatric: Denies confusion, memory loss or sensory loss. Endo: Denies change in weight, skin or hair change.  Heme/Lymph: No excessive bleeding, bruising or enlarged lymph nodes.  Physical Exam  BP 138/82 mmHg  Pulse 88  Temp(Src) 97.3 F (36.3 C)  Resp 16  Ht 5\' 2"  (1.575 m)  Wt 106 lb 6.4 oz (48.263 kg)  BMI 19.46 kg/m2  Appears well nourished and in no distress. Eyes: PERRLA, EOMs, conjunctiva no swelling or erythema. Sinuses:  No frontal/maxillary tenderness ENT/Mouth: EAC's clear, TM's nl w/o erythema, bulging. Nares clear w/o erythema, swelling, exudates. Oropharynx clear without erythema or exudates. Oral hygiene is good. Tongue normal, non obstructing. Hearing intact.  Neck: Supple. Thyroid nl. Car 2+/2+ without bruits, nodes or JVD. Chest: Respirations nl with BS clear & equal w/o rales, rhonchi, wheezing or stridor.  Cor: Heart sounds normal w/ regular rate and rhythm without sig. murmurs, gallops, clicks, or rubs. Peripheral pulses normal and equal  without edema.  Abdomen: Soft & bowel sounds normal. Non-tender w/o guarding, rebound, hernias, masses, or organomegaly. There is a non-tender palpable and mobile cord-like structure palpable in the R lateral abdominal sub-cut tissue which measures  approx 6" vertical length and approx 3/8-1/2" cross diameter.  Lymphatics: Unremarkable.  Musculoskeletal: Full ROM all peripheral extremities, joint stability, 5/5 strength, and normal gait.  Skin: Warm, dry without exposed rashes, lesions or ecchymosis apparent.  Neuro: Cranial nerves intact, reflexes equal bilaterally. Sensory-motor testing grossly intact. Tendon reflexes grossly intact.  Pysch: Alert &  oriented x 3.  Insight and judgement nl & appropriate. No ideations.  Assessment and Plan:  1. Essential hypertension  - TSH  2. Hyperlipidemia  - Lipid panel - TSH  3. Vitamin D deficiency  - VITAMIN D 25 Hydroxy   4. Other abnormal glucose  - Hemoglobin A1c - Insulin, random  5. Medication management  - CBC with Differential/Platelet - BASIC METABOLIC PANEL WITH GFR - Hepatic function panel - Magnesium  6. R lateral abdominal wall subcutaneous mass   - request U/S to define the abnormal mass   Recommended regular exercise, BP monitoring, weight control, and discussed med and SE's. Recommended labs to assess and monitor clinical status. Further disposition pending results of labs. Over 30 minutes  of exam, counseling, chart review was performed

## 2015-10-11 NOTE — Patient Instructions (Addendum)
Recommend Adult Low Dose Aspirin or   coated  Aspirin 81 mg daily   To reduce risk of Colon Cancer 20 %,   Skin Cancer 26 % ,   Melanoma 46%   and   Pancreatic cancer 60%   ++++++++++++++++++++++++++++++++++++++++++++++++++++++  Vitamin D goal   is between 70-100.   Please make sure that you are taking your Vitamin D as directed.   It is very important as a natural anti-inflammatory   helping hair, skin, and nails, as well as reducing stroke and heart attack risk.   It helps your bones and helps with mood.  It also decreases numerous cancer risks so please take it as directed.   Low Vit D is associated with a 200-300% higher risk for CANCER   and 200-300% higher risk for HEART   ATTACK  &  STROKE.   ......................................  It is also associated with higher death rate at younger ages,   autoimmune diseases like Rheumatoid arthritis, Lupus, Multiple Sclerosis.     Also many other serious conditions, like depression, Alzheimer's  Dementia, infertility, muscle aches, fatigue, fibromyalgia - just to name a few.  ++++++++++++++++++++++++++++++++++++++++++++++++  Recommend the book "The END of DIETING" by Dr Joel Fuhrman   & the book "The END of DIABETES " by Dr Joel Fuhrman  At [employer] - get book & Audio CD's     Being diabetic has a  300% increased risk for heart attack, stroke, cancer, and alzheimer- type vascular dementia. It is very important that you work harder with diet by avoiding all foods that are white. Avoid white rice (brown & wild rice is OK), white potatoes (sweetpotatoes in moderation is OK), White bread or wheat bread or anything made out of white flour like bagels, donuts, rolls, buns, biscuits, cakes, pastries, cookies, pizza crust, and pasta (made from white flour & egg whites) - vegetarian pasta or spinach or wheat pasta is OK. Multigrain breads like Arnold's or Pepper[employer], or multigrain sandwich thins or flatbreads.  Diet,  exercise and weight loss can reverse and cure diabetes in the early stages.  Diet, exercise and weight loss is very important in the control and prevention of complications of diabetes which affects every system in your body, ie. Brain - dementia/stroke, eyes - glaucoma/blindness, heart - heart attack/heart failure, kidneys - dialysis, stomach - gastric paralysis, intestines - malabsorption, nerves - severe painful neuritis, circulation - gangrene & loss of a leg(s), and finally cancer and Alzheimers.    I recommend avoid fried & greasy foods,  sweets/candy, white rice (brown or wild rice or Quinoa is OK), white potatoes (sweet potatoes are OK) - anything made from white flour - bagels, doughnuts, rolls, buns, biscuits,white and wheat breads, pizza crust and traditional pasta made of white flour & egg white(vegetarian pasta or spinach or wheat pasta is OK).  Multi-grain bread is OK - like multi-grain flat bread or sandwich thins. Avoid alcohol in excess. Exercise is also important.    Eat all the vegetables you want - avoid meat, especially red meat and dairy - especially cheese.  Cheese is the most concentrated form of trans-fats which is the worst thing to clog up our arteries. Veggie cheese is OK which can be found in the fresh produce section at [employer] or [employer] or [employer]  ++++++++++++++++++++++++++++++++++++++++++++++++++ DASH Eating Plan  DASH stands for "Dietary Approaches to Stop Hypertension."   The DASH eating plan is a healthy eating plan that has been shown to reduce high   blood pressure (hypertension). Additional health benefits may include reducing the risk of type 2 diabetes mellitus, heart disease, and stroke. The DASH eating plan may also help with weight loss.  WHAT DO I NEED TO KNOW ABOUT THE DASH EATING PLAN? For the DASH eating plan, you will follow these general guidelines:  Choose foods with a percent daily value for sodium of less than 5% (as listed on the food  label).  Use salt-free seasonings or herbs instead of table salt or sea salt.  Check with your health care provider or pharmacist before using salt substitutes.  Eat lower-sodium products, often labeled as "lower sodium" or "no salt added."  Eat fresh foods.  Eat more vegetables, fruits, and low-fat dairy products.    Choose whole grains. Look for the word "whole" as the first word in the ingredient list.  Choose fish   Limit sweets, desserts, sugars, and sugary drinks.  Choose heart-healthy fats.  Eat veggie cheese   Eat more home-cooked food and less restaurant, buffet, and fast food.  Limit fried foods.  Cook foods using methods other than frying.  Limit canned vegetables. If you do use them, rinse them well to decrease the sodium.  When eating at a restaurant, ask that your food be prepared with less salt, or no salt if possible.                      WHAT FOODS CAN I EAT?  Seek help from a dietitian for individual calorie needs.  Grains Whole grain or whole wheat bread. Brown rice. Whole grain or whole wheat pasta. Quinoa, bulgur, and whole grain cereals. Low-sodium cereals. Corn or whole wheat flour tortillas. Whole grain cornbread. Whole grain crackers. Low-sodium crackers.  Vegetables Fresh or frozen vegetables (raw, steamed, roasted, or grilled). Low-sodium or reduced-sodium tomato and vegetable juices. Low-sodium or reduced-sodium tomato sauce and paste. Low-sodium or reduced-sodium canned vegetables.   Fruits All fresh, canned (in natural juice), or frozen fruits.  Protein Products  All fish and seafood.  Dried beans, peas, or lentils. Unsalted nuts and seeds. Unsalted canned beans.  Dairy Low-fat dairy products, such as skim or 1% milk, 2% or reduced-fat cheeses, low-fat ricotta or cottage cheese, or plain low-fat yogurt. Low-sodium or reduced-sodium cheeses.  Fats and Oils Tub margarines without trans fats. Light or reduced-fat mayonnaise and salad  dressings (reduced sodium). Avocado. Safflower, olive, or canola oils. Natural peanut or almond butter.  Other Unsalted popcorn and pretzels. The items listed above may not be a complete list of recommended foods or beverages. Contact your dietitian for more options.  +++++++++++++++++++++++++++++++++++++++++++  WHAT FOODS ARE NOT RECOMMENDED?  Grains/ White flour or wheat flour  White bread. White pasta. White rice. Refined cornbread. Bagels and croissants. Crackers that contain trans fat.  Vegetables  Creamed or fried vegetables. Vegetables in a . Regular canned vegetables. Regular canned tomato sauce and paste. Regular tomato and vegetable juices.  Fruits Dried fruits. Canned fruit in light or heavy syrup. Fruit juice.  Meat and Other Protein Products Meat in general. Fatty cuts of meat. Ribs, chicken wings, bacon, sausage, bologna, salami, chitterlings, fatback, hot dogs, bratwurst, and packaged luncheon meats.  Dairy Whole or 2% milk, cream, half-and-half, and cream cheese. Whole-fat or sweetened yogurt. Full-fat cheeses or blue cheese. Nondairy creamers and whipped toppings. Processed cheese, cheese spreads, or cheese curds.  Condiments Onion and garlic salt, seasoned salt, table salt, and sea salt. Canned and packaged gravies. Worcestershire sauce. Tartar sauce.  Barbecue sauce. Teriyaki sauce. Soy sauce, including reduced sodium. Steak sauce. Fish sauce. Oyster sauce. Cocktail sauce. Horseradish. Ketchup and mustard. Meat flavorings and tenderizers. Bouillon cubes. Hot sauce. Tabasco sauce. Marinades. Taco seasonings. Relishes.  Fats and Oils Butter, stick margarine, lard, shortening, ghee, and bacon fat. Coconut, palm kernel, or palm oils. Regular salad dressings.  Pickles and olives. Salted popcorn and pretzels. The items listed above may not be a complete list of foods and beverages to avoid.  Vitamin D Deficiency Vitamin D deficiency is when your body does not have  enough vitamin D. Vitamin D is important to your body for many reasons:  It helps the body to absorb two important minerals, called calcium and phosphorus.  It plays a role in bone health.  It may help to prevent some diseases, such as diabetes and multiple sclerosis.  It plays a role in muscle function, including heart function. You can get vitamin D by:  Eating foods that naturally contain vitamin D.  Eating or drinking milk or other dairy products that have vitamin D added to them.  Taking a vitamin D supplement or a multivitamin supplement that contains vitamin D.  Being in the sun. Your body naturally makes vitamin D when your skin is exposed to sunlight. Your body changes the sunlight into a form of the vitamin that the body can use. If vitamin D deficiency is severe, it can cause a condition in which your bones become soft. In adults, this condition is called osteomalacia. In children, this condition is called rickets. CAUSES Vitamin D deficiency may be caused by:  Not eating enough foods that contain vitamin D.  Not getting enough sun exposure.  Having certain digestive system diseases that make it difficult for your body to absorb vitamin D. These diseases include Crohn disease, chronic pancreatitis, and cystic fibrosis.  Having a surgery in which a part of the stomach or a part of the small intestine is removed.  Being obese.  Having chronic kidney disease or liver disease. RISK FACTORS This condition is more likely to develop in:  Older people.  People who do not spend much time outdoors.  People who live in a long-term care facility.  People who have had broken bones.  People with weak or thin bones (osteoporosis).  People who have a disease or condition that changes how the body absorbs vitamin D.  People who have dark skin.  People who take certain medicines, such as steroid medicines or certain seizure medicines.  People who are overweight or  obese. SYMPTOMS In mild cases of vitamin D deficiency, there may not be any symptoms. If the condition is severe, symptoms may include:  Bone pain.  Muscle pain.  Falling often.  Broken bones caused by a minor injury. DIAGNOSIS This condition is usually diagnosed with a blood test.  TREATMENT Treatment for this condition may depend on what caused the condition. Treatment options include:  Taking vitamin D supplements.  Taking a calcium supplement. Your health care provider will suggest what dose is best for you. HOME CARE INSTRUCTIONS  Take medicines and supplements only as told by your health care provider.  Eat foods that contain vitamin D. Choices include:  Fortified dairy products, cereals, or juices. Fortified means that vitamin D has been added to the food. Check the label on the package to be sure.  Fatty fish, such as salmon or trout.  Eggs.  Oysters.  Do not use a tanning bed.  Maintain a healthy weight. Lose  weight, if needed.  Keep all follow-up visits as told by your health care provider. This is important. SEEK MEDICAL CARE IF:  Your symptoms do not go away.  You feel like throwing up (nausea) or you throw up (vomit).  You have fewer bowel movements than usual or it is difficult for you to have a bowel movement (constipation).   This information is not intended to replace advice given to you by your health care provider. Make sure you discuss any questions you have with your health care provider.   Document Released: 12/31/2011 Document Revised: 06/29/2015 Document Reviewed: 02/23/2015 Elsevier Interactive Patient Education [employer].

## 2015-10-12 ENCOUNTER — Other Ambulatory Visit: Payer: Self-pay | Admitting: Internal Medicine

## 2015-10-12 LAB — VITAMIN D 25 HYDROXY (VIT D DEFICIENCY, FRACTURES): VIT D 25 HYDROXY: 16 ng/mL — AB (ref 30–100)

## 2015-10-12 LAB — INSULIN, RANDOM: Insulin: 5.3 u[IU]/mL (ref 2.0–19.6)

## 2015-10-12 MED ORDER — ATORVASTATIN CALCIUM 80 MG PO TABS: ORAL_TABLET | ORAL | Status: DC

## 2015-10-20 ENCOUNTER — Ambulatory Visit
Admission: RE | Admit: 2015-10-20 | Discharge: 2015-10-20 | Disposition: A | Discharge status: Home / Self Care | Payer: Medicare Other | Source: Ambulatory Visit | Attending: Internal Medicine | Admitting: Internal Medicine

## 2015-10-20 DIAGNOSIS — R1900 Intra-abdominal and pelvic swelling, mass and lump, unspecified site: Secondary | ICD-10-CM

## 2015-11-11 ENCOUNTER — Ambulatory Visit
Admission: RE | Admit: 2015-11-11 | Discharge: 2015-11-11 | Disposition: A | Discharge status: Home / Self Care | Payer: Medicare Other | Source: Ambulatory Visit

## 2015-11-11 ENCOUNTER — Other Ambulatory Visit: Payer: Self-pay

## 2015-11-11 DIAGNOSIS — R922 Inconclusive mammogram: Secondary | ICD-10-CM | POA: Diagnosis not present

## 2015-11-11 DIAGNOSIS — Z1231 Encounter for screening mammogram for malignant neoplasm of breast: Secondary | ICD-10-CM

## 2015-11-11 DIAGNOSIS — Z853 Personal history of malignant neoplasm of breast: Secondary | ICD-10-CM

## 2016-01-05 DIAGNOSIS — Q103 Other congenital malformations of eyelid: Secondary | ICD-10-CM | POA: Diagnosis not present

## 2016-03-13 DIAGNOSIS — H5203 Hypermetropia, bilateral: Secondary | ICD-10-CM | POA: Diagnosis not present

## 2016-03-13 DIAGNOSIS — H11153 Pinguecula, bilateral: Secondary | ICD-10-CM | POA: Diagnosis not present

## 2016-03-13 DIAGNOSIS — Z961 Presence of intraocular lens: Secondary | ICD-10-CM | POA: Diagnosis not present

## 2016-03-13 DIAGNOSIS — H26493 Other secondary cataract, bilateral: Secondary | ICD-10-CM | POA: Diagnosis not present

## 2016-03-13 DIAGNOSIS — H04123 Dry eye syndrome of bilateral lacrimal glands: Secondary | ICD-10-CM | POA: Diagnosis not present

## 2016-03-13 DIAGNOSIS — H43813 Vitreous degeneration, bilateral: Secondary | ICD-10-CM | POA: Diagnosis not present

## 2016-03-13 DIAGNOSIS — H52223 Regular astigmatism, bilateral: Secondary | ICD-10-CM | POA: Diagnosis not present

## 2016-03-27 ENCOUNTER — Telehealth: Payer: Self-pay | Admitting: Nurse Practitioner

## 2016-03-27 NOTE — Telephone Encounter (Signed)
Left message for patient re lab and LTS for 6/8 and 6/12. Schedule mailed and patient also mychart active.

## 2016-03-28 ENCOUNTER — Ambulatory Visit (INDEPENDENT_AMBULATORY_CARE_PROVIDER_SITE_OTHER): Payer: Medicare Other | Admitting: Physician Assistant

## 2016-03-28 ENCOUNTER — Other Ambulatory Visit: Payer: Self-pay | Admitting: Nurse Practitioner

## 2016-03-28 ENCOUNTER — Encounter: Payer: Self-pay | Admitting: Physician Assistant

## 2016-03-28 VITALS — BP 140/100 | HR 83 | Temp 97.7°F | Resp 14 | Ht 62.0 in | Wt 106.8 lb

## 2016-03-28 DIAGNOSIS — G459 Transient cerebral ischemic attack, unspecified: Secondary | ICD-10-CM

## 2016-03-28 DIAGNOSIS — I872 Venous insufficiency (chronic) (peripheral): Secondary | ICD-10-CM | POA: Diagnosis not present

## 2016-03-28 DIAGNOSIS — M810 Age-related osteoporosis without current pathological fracture: Secondary | ICD-10-CM

## 2016-03-28 DIAGNOSIS — I739 Peripheral vascular disease, unspecified: Secondary | ICD-10-CM

## 2016-03-28 DIAGNOSIS — I679 Cerebrovascular disease, unspecified: Secondary | ICD-10-CM

## 2016-03-28 DIAGNOSIS — C50311 Malignant neoplasm of lower-inner quadrant of right female breast: Secondary | ICD-10-CM

## 2016-03-28 DIAGNOSIS — Z0001 Encounter for general adult medical examination with abnormal findings: Secondary | ICD-10-CM

## 2016-03-28 DIAGNOSIS — R7309 Other abnormal glucose: Secondary | ICD-10-CM

## 2016-03-28 DIAGNOSIS — Z136 Encounter for screening for cardiovascular disorders: Secondary | ICD-10-CM | POA: Diagnosis not present

## 2016-03-28 DIAGNOSIS — K624 Stenosis of anus and rectum: Secondary | ICD-10-CM

## 2016-03-28 DIAGNOSIS — Z79899 Other long term (current) drug therapy: Secondary | ICD-10-CM | POA: Diagnosis not present

## 2016-03-28 DIAGNOSIS — G4700 Insomnia, unspecified: Secondary | ICD-10-CM

## 2016-03-28 DIAGNOSIS — I10 Essential (primary) hypertension: Secondary | ICD-10-CM

## 2016-03-28 DIAGNOSIS — E559 Vitamin D deficiency, unspecified: Secondary | ICD-10-CM

## 2016-03-28 DIAGNOSIS — K2100 Gastro-esophageal reflux disease with esophagitis, without bleeding: Secondary | ICD-10-CM

## 2016-03-28 DIAGNOSIS — Z0000 Encounter for general adult medical examination without abnormal findings: Secondary | ICD-10-CM

## 2016-03-28 DIAGNOSIS — E785 Hyperlipidemia, unspecified: Secondary | ICD-10-CM | POA: Diagnosis not present

## 2016-03-28 DIAGNOSIS — K589 Irritable bowel syndrome without diarrhea: Secondary | ICD-10-CM

## 2016-03-28 DIAGNOSIS — R6889 Other general symptoms and signs: Secondary | ICD-10-CM

## 2016-03-28 LAB — CBC WITH DIFFERENTIAL/PLATELET
BASOS PCT: 0 %
Basophils Absolute: 0 cells/uL (ref 0–200)
Eosinophils Absolute: 89 cells/uL (ref 15–500)
Eosinophils Relative: 1 %
HCT: 38.5 % (ref 35.0–45.0)
Hemoglobin: 13 g/dL (ref 11.7–15.5)
LYMPHS PCT: 12 %
Lymphs Abs: 1068 cells/uL (ref 850–3900)
MCH: 31.6 pg (ref 27.0–33.0)
MCHC: 33.8 g/dL (ref 32.0–36.0)
MCV: 93.4 fL (ref 80.0–100.0)
MONO ABS: 445 {cells}/uL (ref 200–950)
MPV: 10.2 fL (ref 7.5–12.5)
Monocytes Relative: 5 %
NEUTROS ABS: 7298 {cells}/uL (ref 1500–7800)
Neutrophils Relative %: 82 %
PLATELETS: 294 10*3/uL (ref 140–400)
RBC: 4.12 MIL/uL (ref 3.80–5.10)
RDW: 12.6 % (ref 11.0–15.0)
WBC: 8.9 10*3/uL (ref 3.8–10.8)

## 2016-03-28 LAB — LIPID PANEL
Cholesterol: 152 mg/dL (ref 125–200)
HDL: 87 mg/dL (ref >=46)
LDL CALC: 48 mg/dL (ref <130)
TRIGLYCERIDES: 83 mg/dL (ref <150)
Total CHOL/HDL Ratio: 1.7 Ratio (ref <=5.0)
VLDL: 17 mg/dL (ref <30)

## 2016-03-28 LAB — BASIC METABOLIC PANEL WITH GFR
BUN: 12 mg/dL (ref 7–25)
CO2: 26 mmol/L (ref 20–31)
Calcium: 9.6 mg/dL (ref 8.6–10.4)
Chloride: 103 mmol/L (ref 98–110)
Creat: 0.76 mg/dL (ref 0.60–0.93)
GFR, Est Non African American: 79 mL/min (ref >=60)
GLUCOSE: 88 mg/dL (ref 65–99)
POTASSIUM: 4 mmol/L (ref 3.5–5.3)
Sodium: 143 mmol/L (ref 135–146)

## 2016-03-28 LAB — HEPATIC FUNCTION PANEL
ALK PHOS: 97 U/L (ref 33–130)
ALT: 17 U/L (ref 6–29)
AST: 22 U/L (ref 10–35)
Albumin: 4.6 g/dL (ref 3.6–5.1)
BILIRUBIN TOTAL: 1.9 mg/dL — AB (ref 0.2–1.2)
Bilirubin, Direct: 0.4 mg/dL — ABNORMAL HIGH (ref <=0.2)
Indirect Bilirubin: 1.5 mg/dL — ABNORMAL HIGH (ref 0.2–1.2)
Total Protein: 6.8 g/dL (ref 6.1–8.1)

## 2016-03-28 LAB — TSH: TSH: 1.51 mIU/L

## 2016-03-28 LAB — HEMOGLOBIN A1C
Hgb A1c MFr Bld: 5.5 % (ref <5.7)
Mean Plasma Glucose: 111 mg/dL

## 2016-03-28 LAB — MAGNESIUM: MAGNESIUM: 2.1 mg/dL (ref 1.5–2.5)

## 2016-03-28 MED ORDER — ALENDRONATE SODIUM 70 MG PO TABS: 70.0000 mg | ORAL_TABLET | ORAL | Status: DC

## 2016-03-28 NOTE — Progress Notes (Signed)
MEDICARE ANNUAL WELLNESS VISIT AND CPE  Assessment:   1. HYPERTENSION - CBC with Differential - BASIC METABOLIC PANEL WITH GFR - Hepatic function panel - TSH - Urinalysis, Routine w reflex microscopic - Microalbumin / creatinine urine ratio - EKG 12-Lead  2. PERIPHERAL VASCULAR DISEASE Elevate legs, compression stockings  3. HYPERLIPIDEMIA - Lipid panel  4. Insomnia-  -good sleep hygiene discussed, increase day time activity, try melatonin or benadryl if this does not help we will call in sleep medication.   5. Elevated glucose Check A1C - Hemoglobin A1c  6. Unspecified vitamin D deficiency  7.Encounter for long-term (current) use of other medications - Magnesium  8. IBS Diet controlled  9. Osteoporosis, unspecified - willing to try fosamax will get on vitamin D/calcium and discussed strength training.  - DG Bone Density; Future in 2 years  10. History of TIA/CVA Continue ASA, only on 81mg  Control blood pressure, cholesterol, glucose, increase exercise.   11. GERD  H2  12. History of breast cancer Continue close follow up  13. Venous (peripheral) insufficiency continue compression stockings and elevation  14. Cerebrovascular disease Control blood pressure, cholesterol, glucose, increase exercise.continue ASA  15. STENOSIS, RECTAL   Plan:   During the course of the visit the patient was educated and counseled about appropriate screening and preventive services including:    Pneumococcal vaccine   Influenza vaccine  Td vaccine  Screening electrocardiogram  Screening mammography  Bone densitometry screening  Colorectal cancer screening  Diabetes screening  Glaucoma screening  Nutrition counseling   Advanced directives: given information/requested  Conditions/risks identified: BMI: Discussed weight loss, diet, and increase physical activity.  Increase physical activity: AHA recommends 150 minutes of physical activity a week.   Medications reviewed DEXA- requested- will [facility] Urinary Incontinence is an issue: discussed non pharmacology and pharmacology options.  Fall risk: low- discussed PT, home fall assessment, medications.   Subjective:   Crystal [name] is a 70 y.o. female who presents for Medicare Annual Wellness Visit and complete physical.  Date of last medicare wellness visit was 03/2015  Her blood pressure has not been checked at home, today their BP is BP: (!) 140/100 mmHg, states stressful driving in traffic from [place] and did not take her norvasc this AM. She does not workout. She denies chest pain, shortness of breath, dizziness.  She is not on cholesterol medication and denies myalgias. Her cholesterol is not at goal. The cholesterol last visit was:   Lab Results  Component Value Date   CHOL 234* 10/11/2015   HDL 73 10/11/2015   LDLCALC 144* 10/11/2015   LDLDIRECT 147.6 06/30/2012   TRIG 86 10/11/2015   CHOLHDL 3.2 10/11/2015   Lab Results  Component Value Date   HGBA1C 5.5 10/11/2015   Patient is not on Vitamin D supplement.  Lab Results  Component Value Date   VD25OH 16* 10/11/2015    She continues to see her Oncologist and get MRI breast yearly.  She has IBS/constipatoin and is on miralax which helps. She is following with Dr. Lorin Mercy, has cervical OA, being treated.  She is widowed, some depression living alone but states goes to church, stays busy.   Names of Other Physician/Practitioners you currently use: 1. [facility]- here for primary care 2. Dr. Sabra Heck, eye doctor, 02/2016 3. Dr. Talbert Forest Sept 2015, cataract removal 4.  Dr. Berdine Addison, dentist, 03/2016 5. Dr. Lorin Mercy, ortho Patient Care Team: Unk Pinto, MD as PCP - General (Internal Medicine)  Medication Review Current Outpatient Prescriptions on File Prior to Visit  Medication Sig Dispense Refill  . amLODipine (NORVASC) 5 MG tablet Take 1 tablet (5 mg total) by mouth  daily. 30 tablet 11  . polyethylene glycol (MIRALAX / GLYCOLAX) packet Take 17 g by mouth 3 (three) times a week.    Marland Kitchen atorvastatin (LIPITOR) 80 MG tablet Take 1/2 tablet or as directed for cholesterol 30 tablet 5   No current facility-administered medications on file prior to visit.    Current Problems (verified) Patient Active Problem List   Diagnosis Date Noted  . Vitamin D deficiency 10/11/2015  . Other abnormal glucose 10/11/2015  . Medication management 10/11/2015  . Osteoporosis 03/28/2015  . Breast cancer of lower-inner quadrant of right female breast ([place]) 07/20/2013  . Venous (peripheral) insufficiency 07/18/2010  . Peripheral vascular disease ([place]) 06/17/2010  . Cerebrovascular disease 06/16/2010  . Transient cerebral ischemia 03/02/2010  . STENOSIS, RECTAL 02/16/2009  . Irritable bowel syndrome 01/12/2009  . Hyperlipidemia 05/10/2007  . Essential hypertension 05/10/2007  . GERD 05/10/2007   Past Surgical History  Procedure Laterality Date  . Abdominal hysterectomy    . Hemmorhoidectomy      x 3  . Cardiac catherization    . Esophageal tumor excised; posterior 1986    . Electrocardiogram  10/30/2006  . Edg  04/21/2007  . Breast surgery Left     left breast x 2  . Cardiac catheterization    . Breast lumpectomy Right    family history includes Bone cancer in her father; Heart disease in her brother. There is no history of Colon cancer, Breast cancer, Diabetes, or Anesthesia problems.  No Known Allergies  Screening Tests Immunization History  Administered Date(s) Administered  . Influenza Split 08/22/2011, 06/30/2012  . Influenza Whole 08/13/2008, 08/03/2009, 07/18/2010  . Influenza,inj,Quad PF,36+ Mos 07/28/2013, 07/22/2015  . Pneumococcal Conjugate-13 10/20/2013  . Pneumococcal Polysaccharide-23 08/13/2008  . Pneumococcal-Unspecified 11/12/2009  . Td 01/31/2010  . Tdap 08/22/2011   Preventative care: Last colonoscopy: 2010 Dr. Sharlett Iles but is retired,  due 2020 Last mammogram: 10/2015, 1 year MRI breast 03/2015 Last pap smear/pelvic exam: 2010   DEXA: 03/2015 + osteoporsis CT chest 06/2013 [place] AB 2016 Echo 2009  Prior vaccinations: TD or Tdap: 2012  Influenza: 2016  Pneumococcal: 2009 Prevnar 13: 2014 Shingles/Zostavax: 12/2011   History reviewed: allergies, current medications, past family history, past medical history, past social history, past surgical history and problem list  Risk Factors: Osteoporosis: postmenopausal estrogen deficiency, dietary calcium and/or vitamin D deficiency and smoking history, low BMI.  History of fracture in the past year: no  Tobacco Social History  Substance Use Topics  . Smoking status: Former Smoker    Types: Cigarettes    Quit date: 10/22/1965  . Smokeless tobacco: Never Used  . Alcohol Use: 1.8 oz/week    3 Glasses of wine per week     Comment: occasional    She does not smoke.  Patient is a former smoker. Are there smokers in your home (other than you)?  No  Alcohol Current alcohol use: social drinker  Caffeine Current caffeine use: coffee 1 /day  Exercise  Current exercise: none  Nutrition/Diet Current diet: in general, a "healthy" diet    Cardiac risk factors: advanced age (older than 63 for men, 21 for women), dyslipidemia, hypertension and sedentary lifestyle.  Depression Screen (Note: if answer to either of the following is "Yes", a more complete depression screening is indicated)   Q1: Over the  past two weeks, have you felt down, depressed or hopeless? No  Q2: Over the past two weeks, have you felt little interest or pleasure in doing things? No  Have you lost interest or pleasure in daily life? No  Do you often feel hopeless? No  Do you cry easily over simple problems? No  Activities of Daily Living In your present state of health, do you have any difficulty performing the following activities?:  Driving? No Managing money?  No Feeding yourself? No Getting  from bed to chair? No Climbing a flight of stairs? No Preparing food and eating?: No Bathing or showering? No Getting dressed: No Getting to the toilet? No Using the toilet:No Moving around from place to place: No In the past year have you fallen or had a near fall?:No  Vision Difficulties: No  Hearing Difficulties: No Do you often ask people to speak up or repeat themselves? No Do you experience ringing or noises in your ears? No Do you have difficulty understanding soft or whispered voices? No  Cognition  Do you feel that you have a problem with memory?No  Do you often misplace items? No  Do you feel safe at home?  Yes  Advanced directives Does patient have a [place]? Yes Does patient have a Living Will? Yes   Objective:     Blood pressure 140/100, pulse 83, temperature 97.7 F (36.5 C), temperature source Temporal, resp. rate 14, height 5\' 2"  (1.575 m), weight 106 lb 12.8 oz (48.444 kg), SpO2 96 %. Body mass index is 19.53 kg/(m^2).  General appearance: alert, no distress, WD/WN,  female Cognitive Testing  Alert? Yes  Normal Appearance?Yes  Oriented to person? Yes  Place? Yes   Time? Yes  Recall of three objects?  Yes  Can perform simple calculations? Yes  Displays appropriate judgment?Yes  Can read the correct time from a watch face?Yes  HEENT: normocephalic, sclerae anicteric, TMs pearly, nares patent, no discharge or erythema, pharynx normal Oral cavity: MMM, no lesions Neck: supple, no lymphadenopathy, no thyromegaly, no masses Heart: RRR, normal S1, S2, no murmurs Lungs: CTA bilaterally, no wheezes, rhonchi, or rales Abdomen: +bs, soft, non tender, non distended, no masses, no hepatomegaly, no splenomegaly Musculoskeletal: nontender, no swelling, no obvious deformity Extremities: no edema, no cyanosis, no clubbing Pulses: 2+ symmetric, upper and lower extremities, normal cap refill Neurological: alert, oriented x 3, CN2-12 intact,  strength normal upper extremities and lower extremities, sensation normal throughout, DTRs 2+ throughout, no cerebellar signs, gait normal Psychiatric: normal affect, behavior normal, pleasant  Skin: On left anterior medial leg, scaly erythematous annular lesion.  Breast: defer Gyn: defer  Rectal: defer  Medicare Attestation I have personally reviewed: The patient's medical and social history Their use of alcohol, tobacco or illicit drugs Their current medications and supplements The patient's functional ability including ADLs,fall risks, home safety risks, cognitive, and hearing and visual impairment Diet and physical activities Evidence for depression or mood disorders  The patient's weight, height, BMI, and visual acuity have been recorded in the chart.  I have made referrals, counseling, and provided education to the patient based on review of the above and I have provided the patient with a written personalized care plan for preventive services.     Vicie Mutters, PA-C   03/28/2016

## 2016-03-28 NOTE — Patient Instructions (Signed)
Vitamin D goal is between 60-80  YOURS WAS 14 IN DEC, GET ON 5000 IU DAILY, WILL RECHECK NEXT OFFICE VISIT  Please make sure that you are taking your Vitamin D as directed.   It is very important as a natural anti-inflammatory   helping hair, skin, and nails, as well as reducing stroke and heart attack risk.   It helps your bones and helps with mood.  It also decreases numerous cancer risks so please take it as directed.   Low Vit D is associated with a 200-300% higher risk for CANCER   and 200-300% higher risk for HEART   ATTACK  &  STROKE.    .....................................Marland Kitchen  It is also associated with higher death rate at younger ages,   autoimmune diseases like Rheumatoid arthritis, Lupus, Multiple Sclerosis.     Also many other serious conditions, like depression, Alzheimer's  Dementia, infertility, muscle aches, fatigue, fibromyalgia - just to name a few.  +++++++++++++++++++  Can get liquid vitamin D from [place] here in [place] at  [facility] alternatives  Your bone density shows that you have osteoporosis. Osteoporosis is the weakening of your bones and this puts you at much greater risk for fracture with a fall. I'm going to send a medication called Fosamax for you to take once weekly, here is some information below:  Breaking a bone can be serious, especially if the bone is in the hip. People who break a hip sometimes lose the ability to walk on their own. Many of them end up in a nursing home. That's why it is so important to avoid breaking a bone in the first place.   What can I do to keep my bones as healthy as possible? - You can: -Eat foods with a lot of calcium, such as milk, yogurt, and green leafy vegetables  -Eat foods with a lot of vitamin D, such as milk that has vitamin D added, and fish from the ocean  -Take calcium and vitamin D pills (if you do not get enough from the food that you eat) -Be active for at least 30 minutes, most days of the  week -Avoid smoking  -Limit the amount of alcohol you drink to 1 to 2 drinks a day at most  What do osteoporosis medicines do? - If you have osteoporosis or a high risk of breaking a bone, the medicines your doctor prescribes can: -Reduce bone loss  -Increase bone density or keep it about the same -Reduce the chances that you will break a bone   Bisphosphonates - Most people being treated for osteoporosis take these medicines first. If they do not work well enough or cause side effects that are too hard to handle, there are other options.   Most people take one pill every week. If your doctor prescribes a bisphosphonate pill, you must take the medicine exactly as directed. If you don't, the medicine can irritate your throat or stomach. For most bisphosphonate pills, you must:  -Take the pill first thing in the morning, before you have anything to eat or drink.  -Drink an 8-ounce glass of water with the pill, but not eat or drink anything else for 30 minutes or 1 hour (depending on which pill you take).  -Avoid lying down for 30 minutes after taking the pill. (You must sit or stand during that time).     Osteoporosis Osteoporosis is the thinning and loss of density in the bones. Osteoporosis makes the bones more brittle, fragile, and  likely to break (fracture). Over time, osteoporosis can cause the bones to become so weak that they fracture after a simple fall. The bones most likely to fracture are the bones in the hip, wrist, and spine. CAUSES  The exact cause is not known. RISK FACTORS Anyone can develop osteoporosis. You may be at greater risk if you have a family history of the condition or have poor nutrition. You may also have a higher risk if you are:   Female.   7 years old or older.  A smoker.  Not physically active.   White or Asian.  Slender. SIGNS AND SYMPTOMS  A fracture might be the first sign of the disease, especially if it results from a fall or injury that  would not usually cause a bone to break. Other signs and symptoms include:   Low back and neck pain.  Stooped posture.  Height loss. DIAGNOSIS  To make a diagnosis, your health care provider may:  Take a medical history.  Perform a physical exam.  Order tests, such as:  A bone mineral density test.  A dual-energy X-ray absorptiometry test. TREATMENT  The goal of osteoporosis treatment is to strengthen your bones to reduce your risk of a fracture. Treatment may involve:  Making lifestyle changes, such as:  Eating a diet rich in calcium.  Doing weight-bearing and muscle-strengthening exercises.  Stopping tobacco use.  Limiting alcohol intake.  Taking medicine to slow the process of bone loss or to increase bone density.  Monitoring your levels of calcium and vitamin D. HOME CARE INSTRUCTIONS  Include calcium and vitamin D in your diet. Calcium is important for bone health, and vitamin D helps the body absorb calcium.  Perform weight-bearing and muscle-strengthening exercises as directed by your health care provider.  Do not use any tobacco products, including cigarettes, chewing tobacco, and electronic cigarettes. If you need help quitting, ask your health care provider.  Limit your alcohol intake.  Take medicines only as directed by your health care provider.  Keep all follow-up visits as directed by your health care provider. This is important.  Take precautions at home to lower your risk of falling, such as:  Keeping rooms well lit and clutter free.  Installing safety rails on stairs.  Using rubber mats in the bathroom and other areas that are often wet or slippery. SEEK IMMEDIATE MEDICAL CARE IF:  You fall or injure yourself.    This information is not intended to replace advice given to you by your health care provider. Make sure you discuss any questions you have with your health care provider.   Document Released: 07/18/2005 Document Revised:  10/29/2014 Document Reviewed: 03/18/2014 Elsevier Interactive Patient Education [employer].

## 2016-03-29 ENCOUNTER — Other Ambulatory Visit: Payer: Medicare Other

## 2016-03-29 LAB — URINALYSIS, ROUTINE W REFLEX MICROSCOPIC
Bilirubin Urine: NEGATIVE
GLUCOSE, UA: NEGATIVE
Hgb urine dipstick: NEGATIVE
LEUKOCYTES UA: NEGATIVE
Nitrite: NEGATIVE
PH: 5.5 (ref 5.0–8.0)
PROTEIN: NEGATIVE
Specific Gravity, Urine: 1.018 (ref 1.001–1.035)

## 2016-03-29 LAB — MICROALBUMIN / CREATININE URINE RATIO
Creatinine, Urine: 162 mg/dL (ref 20–320)
MICROALB UR: 0.7 mg/dL
MICROALB/CREAT RATIO: 4 ug/mg{creat} (ref <30)

## 2016-04-02 ENCOUNTER — Encounter: Payer: Self-pay | Admitting: Nurse Practitioner

## 2016-04-02 ENCOUNTER — Ambulatory Visit (HOSPITAL_BASED_OUTPATIENT_CLINIC_OR_DEPARTMENT_OTHER): Payer: Medicare Other | Admitting: Nurse Practitioner

## 2016-04-02 VITALS — BP 148/65 | HR 86 | Temp 98.0°F | Resp 20 | Ht 62.0 in | Wt 109.2 lb

## 2016-04-02 DIAGNOSIS — M549 Dorsalgia, unspecified: Secondary | ICD-10-CM | POA: Diagnosis not present

## 2016-04-02 DIAGNOSIS — M546 Pain in thoracic spine: Secondary | ICD-10-CM

## 2016-04-02 DIAGNOSIS — Z853 Personal history of malignant neoplasm of breast: Secondary | ICD-10-CM

## 2016-04-02 DIAGNOSIS — C50311 Malignant neoplasm of lower-inner quadrant of right female breast: Secondary | ICD-10-CM

## 2016-04-02 NOTE — Progress Notes (Signed)
CLINIC:  Cancer Survivorship   REASON FOR VISIT:  Routine follow-up post-treatment for history of breast cancer.  BRIEF ONCOLOGIC HISTORY:    Breast cancer of lower-inner quadrant of right female breast ([place])   03/11/2012 Definitive Surgery Right lumpectomy: ILC, grade 1, ER/PR+, HER2/neu negative; boardly positive margin unable to be cleared short of mastectomy   03/11/2012 Oncotype testing RS 23 (15% ROR)   03/11/2012 Pathologic Stage Stage IA: pT1c N0    - 06/11/2012 Radiation Therapy Adjuvant RT: right breast   05/2012 - 01/2013 Anti-estrogen oral therapy Anastrozole 1 mg; stopped one week later due to elevated LFTs and abd pain; letrozole began 07/2012 and d/c'd 11/2012 due to side effects; exemestane attempted - also poorly tolerated; not a candidate for tam due to clotting history    INTERVAL HISTORY:  Crystal [name] presents to the [facility] today for ongoing follow up regarding her history of breast cancer. Overall, Crystal [name] reports feeling doing well since her last visit with Dr. Jana Hakim in May 2016. She has not noticed any change within her breast and her last mammogram was in January 2017 and unremarkable. Her right breast remains very tender but has been since her surgery.  She denies any headache, cough, or shortness of breath. She has noted some subscapular back pain over the last few months and denies any trauma or injury to the area.  She reports a fair appetite (has been that way since her diagnosis) and denies any weight loss. She does have some difficulty staying asleep at night.  She limits her caffeine to the morning and uses good sleep hygiene practices.     REVIEW OF SYSTEMS:  General: Denies fever, chills, unintentional weight loss, or generalized fatigue.  HEENT: Ringing in ears, stable. Runny nose. Denies visual changes, hearing loss, mouth sores, or difficulty swallowing. Cardiac: Occasional palpitations.  Denies lower extremity edema.  Respiratory: Denies  wheeze or dyspnea on exertion.  Breast: As above.  GI: Denies abdominal pain, constipation, diarrhea, nausea, or vomiting.  GU: Urine dribbling.  Denies dysuria, hematuria, vaginal bleeding, vaginal discharge, or vaginal dryness.  Musculoskeletal: As above.  Neuro: Denies recent fall or numbness / tingling in her extremities.  Skin: Denies rash, pruritis, or open wounds.  Psych: Anxiety. Denies depression, , insomnia, or memory loss.   A 14-point review of systems was completed and was negative, except as noted above.   ONCOLOGY TREATMENT TEAM:  1. Surgeon:  Dr. Donne Hazel at [facility]  2. Medical Oncologist: Dr. Jana Hakim 3. Radiation Oncologist: Dr. Lisbeth Renshaw    PAST MEDICAL/SURGICAL HISTORY:  Past Medical History  Diagnosis Date  . Abdominal aortic ectasia ([place]) 01/31/2010    Qualifier: Diagnosis of  By: [name] MD, [place] DISEASE 06/16/2010    Qualifier: Diagnosis of  By: [name] MD, [place]   . CONSTIPATION 01/12/2009    Qualifier: Diagnosis of  By: [name] CMA ([place]), Mearl Latin    . ESOPHAGEAL STRICTURE 01/12/2009    Qualifier: Diagnosis of  By: [name] CMA ([place]), Mearl Latin    . EXTERNAL HEMORRHOIDS 01/12/2009    Qualifier: Diagnosis of  By: [name] CMA ([place]), Mearl Latin    . GERD 05/10/2007    Qualifier: Diagnosis of  By: [name] RMA, [place]    . HYPERLIPIDEMIA 05/10/2007    Qualifier: Diagnosis of  By: [name], [place]    . HYPERTENSION 05/10/2007    Qualifier: Diagnosis of  By: [name], [place]    . Irritable bowel syndrome  01/12/2009    Qualifier: Diagnosis of  By: [name] CMA (AAMA), Mearl Latin    . PERIPHERAL VASCULAR DISEASE 06/17/2010    Qualifier: Diagnosis of  By: [name] MD, [place] 02/16/2009    Qualifier: Diagnosis of  By: [name] CMA ([place]), Mearl Latin    . STENOSIS, RECTAL 02/16/2009    Qualifier: Diagnosis of  By: [name] CMA ([place]), Mearl Latin    . Tachyarrhythmia 09/03/2009    Qualifier: Diagnosis of  By: [name] MD, [place]  ISCHEMIC ATTACK 03/02/2010    Qualifier: Diagnosis of  By: [name] MD, [name] VENOUS INSUFFICIENCY, LEGS 07/18/2010    Qualifier: Diagnosis of  By: [name] MD, [name]   . Eosinophilic esophagitis   . Orthostatic hypotension   . Overactive detrusor   . Adjustment disorder with depressed mood   . Breast cancer ([place]) 03/11/12    right breast lumpectomy=invasive lobular ca,grade I/III,ER?PR=positive  . Radiation 04/21/2012-06/11/2012    64 gray right breast  . Melanosis   . Rectal stenosis   . Anxiety   . IBS (irritable bowel syndrome)   . Incontinence    Past Surgical History  Procedure Laterality Date  . Abdominal hysterectomy    . Hemmorhoidectomy      x 3  . Cardiac catherization    . Esophageal tumor excised; posterior 1986    . Electrocardiogram  10/30/2006  . Edg  04/21/2007  . Breast surgery Left     left breast x 2  . Cardiac catheterization    . Breast lumpectomy Right      ALLERGIES:  No Known Allergies   CURRENT MEDICATIONS:  Current Outpatient Prescriptions on File Prior to Visit  Medication Sig Dispense Refill  . alendronate (FOSAMAX) 70 MG tablet Take 1 tablet (70 mg total) by mouth once a week. 4 tablet 3  . amLODipine (NORVASC) 5 MG tablet Take 1 tablet (5 mg total) by mouth daily. 30 tablet 11  . aspirin 81 MG tablet Take 81 mg by mouth daily.    Marland Kitchen atorvastatin (LIPITOR) 80 MG tablet Take 1/2 tablet or as directed for cholesterol 30 tablet 5  . polyethylene glycol (MIRALAX / GLYCOLAX) packet Take 17 g by mouth 3 (three) times a week. Reported on 04/02/2016     No current facility-administered medications on file prior to visit.     ONCOLOGIC FAMILY HISTORY:  Family History  Problem Relation Age of Onset  . Bone cancer Father   . Heart disease Brother     MI  . Colon cancer Neg Hx   . Breast cancer Neg Hx   . Diabetes Neg Hx   . Anesthesia problems Neg Hx      GENETIC COUNSELING/TESTING: No   SOCIAL HISTORY:  Crystal [name] is widowed  and lives alone in [place], [place].  She has 1 child. Crystal [name] is currently retired.  She denies any current or history of tobacco, alcohol, or illicit drug use.      PHYSICAL EXAMINATION:  Vital Signs: Filed Vitals:   04/02/16 1538  BP: 148/65  Pulse: 86  Temp: 98 F (36.7 C)  Resp: 20   Weight: 109.2 ECOG performance status: 0 General: Well-nourished, well-appearing female in no acute distress.  She is unaccompanied in clinic today.   HEENT: Head is atraumatic and normocephalic.  Pupils equal and reactive to light and accomodation. Conjunctivae clear without exudate.  Sclerae anicteric. Oral mucosa is pink, moist,  and intact without lesions.  Oropharynx is pink without lesions or erythema.  Lymph: No cervical, supraclavicular, infraclavicular, or axillary lymphadenopathy noted on palpation.  Cardiovascular: Regular rate and rhythm without murmurs, rubs, or gallops. Respiratory: Clear to auscultation bilaterally. Chest expansion symmetric without accessory muscle use on inspiration or expiration.  Breast: Bilateral breast exam performed.  Right breast tenderness ongoing.  No mass or lesion in either breast.   GI: Abdomen soft and round. No tenderness to palpation. Bowel sounds normoactive in 4 quadrants. No hepatosplenomegaly.   GU: Deferred.  Musculoskeletal: Muscle strength 5/5 in all extremities.   Neuro: No focal deficits. Steady gait.  Psych: Mood and affect normal and appropriate for situation.  Extremities: No edema, cyanosis, or clubbing.  Skin: Warm and dry. No open lesions noted.   LABORATORY DATA:  Recent Results (from the past 2160 hour(s))  CBC with Differential/Platelet     Status: None   Collection Time: 03/28/16 10:30 AM  Result Value Ref Range   WBC 8.9 3.8 - 10.8 K/uL   RBC 4.12 3.80 - 5.10 MIL/uL   Hemoglobin 13.0 11.7 - 15.5 g/dL   HCT 38.5 35.0 - 45.0 %   MCV 93.4 80.0 - 100.0 fL   MCH 31.6 27.0 - 33.0 pg   MCHC 33.8 32.0 - 36.0 g/dL   RDW  12.6 11.0 - 15.0 %   Platelets 294 140 - 400 K/uL   MPV 10.2 7.5 - 12.5 fL   Neutro Abs 7298 1500 - 7800 cells/uL   Lymphs Abs 1068 850 - 3900 cells/uL   Monocytes Absolute 445 200 - 950 cells/uL   Eosinophils Absolute 89 15 - 500 cells/uL   Basophils Absolute 0 0 - 200 cells/uL   Neutrophils Relative % 82 %   Lymphocytes Relative 12 %   Monocytes Relative 5 %   Eosinophils Relative 1 %   Basophils Relative 0 %   Smear Review Criteria for review not met     Comment: ** Please note change in unit of measure and reference range(s). **  BASIC METABOLIC PANEL WITH GFR     Status: None   Collection Time: 03/28/16 10:30 AM  Result Value Ref Range   Sodium 143 135 - 146 mmol/L   Potassium 4.0 3.5 - 5.3 mmol/L   Chloride 103 98 - 110 mmol/L   CO2 26 20 - 31 mmol/L   Glucose, Bld 88 65 - 99 mg/dL   BUN 12 7 - 25 mg/dL   Creat 0.76 0.60 - 0.93 mg/dL   Calcium 9.6 8.6 - 10.4 mg/dL   GFR, Est African American >89 >=60 mL/min   GFR, Est Non African American 79 >=60 mL/min    Comment:   The estimated GFR is a calculation valid for adults (>=10 years old) that uses the CKD-EPI algorithm to adjust for age and sex. It is   not to be used for children, pregnant women, hospitalized patients,    patients on dialysis, or with rapidly changing kidney function. According to the NKDEP, eGFR >89 is normal, 60-89 shows mild impairment, 30-59 shows moderate impairment, 15-29 shows severe impairment and <15 is ESRD.     Hepatic function panel     Status: Abnormal   Collection Time: 03/28/16 10:30 AM  Result Value Ref Range   Total Bilirubin 1.9 (H) 0.2 - 1.2 mg/dL   Bilirubin, Direct 0.4 (H) <=0.2 mg/dL   Indirect Bilirubin 1.5 (H) 0.2 - 1.2 mg/dL   Alkaline Phosphatase 97 33 - 130  U/L   AST 22 10 - 35 U/L   ALT 17 6 - 29 U/L   Total Protein 6.8 6.1 - 8.1 g/dL   Albumin 4.6 3.6 - 5.1 g/dL  TSH     Status: None   Collection Time: 03/28/16 10:30 AM  Result Value Ref Range   TSH 1.51 mIU/L     Comment:   Reference Range   > or = 20 Years  0.40-4.50   Pregnancy Range First trimester  0.26-2.66 Second trimester 0.55-2.73 Third trimester  0.43-2.91     Lipid panel     Status: None   Collection Time: 03/28/16 10:30 AM  Result Value Ref Range   Cholesterol 152 125 - 200 mg/dL   Triglycerides 83 <150 mg/dL   HDL 87 >=46 mg/dL   Total CHOL/HDL Ratio 1.7 <=5.0 Ratio   VLDL 17 <30 mg/dL   LDL Cholesterol 48 <130 mg/dL    Comment:   Total Cholesterol/HDL Ratio:CHD Risk                        Coronary Heart Disease Risk Table                                        Men       Women          1/2 Average Risk              3.4        3.3              Average Risk              5.0        4.4           2X Average Risk              9.6        7.1           3X Average Risk             23.4       11.0 Use the calculated Patient Ratio above and the CHD Risk table  to determine the patient's CHD Risk.   Hemoglobin A1c     Status: None   Collection Time: 03/28/16 10:30 AM  Result Value Ref Range   Hgb A1c MFr Bld 5.5 <5.7 %    Comment:   For the purpose of screening for the presence of diabetes:   <5.7%       Consistent with the absence of diabetes 5.7-6.4 %   Consistent with increased risk for diabetes (prediabetes) >=6.5 %     Consistent with diabetes   This assay result is consistent with a decreased risk of diabetes.   Currently, no consensus exists regarding use of hemoglobin A1c for diagnosis of diabetes in children.   According to American Diabetes Association (ADA) guidelines, hemoglobin A1c <7.0% represents optimal control in non-pregnant diabetic patients. Different metrics may apply to specific patient populations. Standards of Medical Care in Diabetes (ADA).      Mean Plasma Glucose 111 mg/dL  Magnesium     Status: None   Collection Time: 03/28/16 10:30 AM  Result Value Ref Range   Magnesium 2.1 1.5 - 2.5 mg/dL  Urinalysis, Routine w reflex microscopic (not at  [facility])     Status: Abnormal  Collection Time: 03/28/16 10:30 AM  Result Value Ref Range   Color, Urine YELLOW YELLOW    Comment: ** Please note change in unit of measure and reference range(s). **      APPearance CLEAR CLEAR   Specific Gravity, Urine 1.018 1.001 - 1.035   pH 5.5 5.0 - 8.0   Glucose, UA NEGATIVE NEGATIVE   Bilirubin Urine NEGATIVE NEGATIVE   Ketones, ur TRACE (A) NEGATIVE   Hgb urine dipstick NEGATIVE NEGATIVE   Protein, ur NEGATIVE NEGATIVE   Nitrite NEGATIVE NEGATIVE   Leukocytes, UA NEGATIVE NEGATIVE  Microalbumin / creatinine urine ratio     Status: None   Collection Time: 03/28/16 10:30 AM  Result Value Ref Range   Creatinine, Urine 162 20 - 320 mg/dL   Microalb, Ur 0.7 Not estab mg/dL   Microalb Creat Ratio 4 <30 mcg/mg creat    Comment: The ADA has defined abnormalities in albumin excretion as follows:           Category           Result                            (mcg/mg creatinine)                 Normal:    <30       Microalbuminuria:    30 - 299   Clinical albuminuria:    > or = 300   The ADA recommends that at least two of three specimens collected within a 3 - 6 month period be abnormal before considering a patient to be within a diagnostic category.          ASSESSMENT AND PLAN:   1. 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  Crystal [name] is doing well with no clinical symptoms worrisome for cancer recurrence at this time. I have reviewed the recommendations for ongoing surveillance with her and she will follow-up with her medical oncologist,  Dr. Jana Hakim, in June 2018 with history and physical exam per surveillance protocol. Her mammogram will  be due in January 2018 and we will enter orders for this to be scheduled following her appointment today.   She was instructed to make [place] aware if she notes any change within her breast, any new symptoms such as pain, shortness of breath, weight loss, or fatigue.  Regarding her recently elevated LFTs as ordered by her PCP, I will follow up with her PCP to arrange to have these repeated.  I do not believe that they are related to her cancer, but should be repeated.  2. Back pain: I believe that Crystal [name]'s complaints of pain are not related to her cancer, however, as it has been increasing over the last few months, I have recommended to her that we check an xray to make sure there is no evidence of fracture or malignancy.  I will enter orders for this today and she will come back to have it done, as she has a friend with her in the waiting room that she needs to get to.  Further disposition pending those results.  Of note, she has recently received a Rx for fosamax  and is still considering beginning therapy.   3. Difficulty sleeping: We discussed various sleep hygiene practices and I reinforced to her the safety of the melatonin.  I am encouraged that she awakens refreshed, so it may be that she simply does not need as much sleep hour wise as she once did.    4. Cancer screening:  Due to Crystal [name]'s history and her age, she should receive screening for skin cancers and colon cancer.  She is S/P hysterectomy.  The information and recommendations were shared with the patient and in her written after visit summary.  5. Health maintenance and wellness promotion:Crystal [name] and I discussed recommendations to maximize nutrition and minimize recurrence, such as increased intake of fruits, vegetables, lean proteins, and minimizing the intake of red meats and processed foods.  She was also encouraged to engage in moderate to vigorous exercise for 30 minutes per day most days of the week. She was instructed to limit  her alcohol consumption and continue to abstain from tobacco use.  6. Support services/counseling:  Crystal [name] was offered support today through active listening and expressive supportive counseling.   A total of 30 minutes of face-to-face time was spent with this patient with greater than 50% of that time in counseling and care-coordination.   Sylvan Cheese, NP  [facility] [phone]   Note: PRIMARY CARE PROVIDER Alesia Richards, [place] [phone]

## 2016-04-02 NOTE — Patient Instructions (Signed)
Thank you for coming in today!  As we discussed, please continue to perform your self breast exam and report any changes.  f you note any new symptoms (please see below), be sure to notify us ASAP.  I will enter the order for the xray of your back to make sure we don't see any fracture.   Your mammogram will be due in January 2018 and we will enter orders for it today.  We'll have you return in one year's time for your next appointment with Dr. Jana Hakim or sooner if you have any problems. I will ask that scheduling call you to set up all your appointments except for the xray, which you can obtain at any time. Looking forward to working with you in the future!  Let us know if you have any questions!  Symptoms to Watch for and Report to Your Provider  . Return of the cancer symptoms you had before- such as a lump or new growth where your cancer first started . New or unusual pain that seems unrelated to an injury and does not go away, including back pain or bone pain . Weight loss without trying/intending . Unexplained bleeding . A rash or allergic reaction, such as swelling, severe itching or wheezing . Chills or fevers . Persistent headaches . Shortness of breath or difficulty breathing . Bloody stools or blood in your urine . Lumps, bumps, swelling and/or nipple discharge . Nausea, vomiting, diarrhea, loss of appetite, or trouble swallowing . A cough that doesn't go away . Abdominal pain . Swelling in your arms or legs . Fractures . Hot flashes or other menopausal symptoms . Any other signs mentioned by your doctor or nurse or any unusual symptoms                 that you just can't explain   NOTE: Just because you have certain symptoms, it doesn't mean the cancer has come back or you have a new cancer. Symptoms can be due to other problems that need to be addressed.  It is important to watch for these symptoms and report them to your provider so you can be medically evaluated for any of these  concerns!    Living a Life of Wellness After Cancer:  *Note: Please consult your health care provider before using any medications, supplements, over-the-counter products, or other interventions.  Also, please consult your primary care provider before you begin any lifestyle program (diet, exercise, etc.).  Your safety is our top priority and we want to make sure you continue to live a long and healthy life!    Healthy Lifestyle Recommendations  As a cancer survivor, it is important develop a lifelong commitment to a healthy lifestyle. A healthy lifestyle can prevent cancer from returning as well as prevent other diseases like heart disease, diabetes and high blood pressure.  These are some things that you can do to have a healthy lifestyle:  Marland Kitchen Maintain a healthy weight.  . Exercise daily per your doctor's orders. . Eat a balanced diet high in fruits, vegetables, bran, and fiber. Limit intake of red meat      and processed foods.  . Limit how much alcohol you consume, if at all. Ali Lowe regular bone mineral density testing for osteoporosis.  . Talk to your doctor about cardiovascular disease or "heart disease" screening. . Stop smoking (if you smoke). . Know your family history. . Be mindful of your emotional, social, and spiritual needs. . Meet regularly with  a Primary Care Provider (PCP). Find a PCP if you do not             already have one. . Talk to your doctor about regular cancer screening including screening for colon           cancer, GYN cancers, and skin cancer.    ,

## 2016-04-03 ENCOUNTER — Encounter: Payer: Self-pay | Admitting: Nurse Practitioner

## 2016-04-03 DIAGNOSIS — R945 Abnormal results of liver function studies: Principal | ICD-10-CM

## 2016-04-03 DIAGNOSIS — C50311 Malignant neoplasm of lower-inner quadrant of right female breast: Secondary | ICD-10-CM

## 2016-04-03 DIAGNOSIS — R7989 Other specified abnormal findings of blood chemistry: Secondary | ICD-10-CM

## 2016-04-03 NOTE — Progress Notes (Signed)
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

## 2016-04-16 ENCOUNTER — Ambulatory Visit (HOSPITAL_COMMUNITY)
Admission: RE | Admit: 2016-04-16 | Discharge: 2016-04-16 | Disposition: A | Discharge status: Home / Self Care | Payer: Medicare Other | Source: Ambulatory Visit | Attending: Nurse Practitioner | Admitting: Nurse Practitioner

## 2016-04-16 DIAGNOSIS — M546 Pain in thoracic spine: Secondary | ICD-10-CM | POA: Diagnosis not present

## 2016-05-25 DIAGNOSIS — Z23 Encounter for immunization: Secondary | ICD-10-CM | POA: Diagnosis not present

## 2016-06-09 IMAGING — US US ABDOMEN LIMITED
1 series · 13 of 13 positions shown · non-contrast
Comparison: None.

CLINICAL DATA: Nontender elongated subcutaneous mass along the
right lateral abdomen for 1 month. History of breast carcinoma.

EXAM:
LIMITED ABDOMINAL ULTRASOUND

[Series 1: us abdomen limited · 0.06mm/px · 13 of 13 slices shown]
[im 1/13]
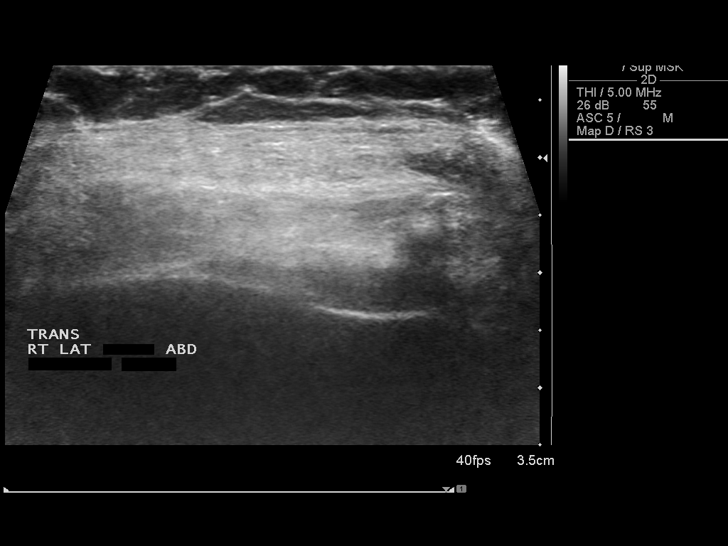
[im 2/13]
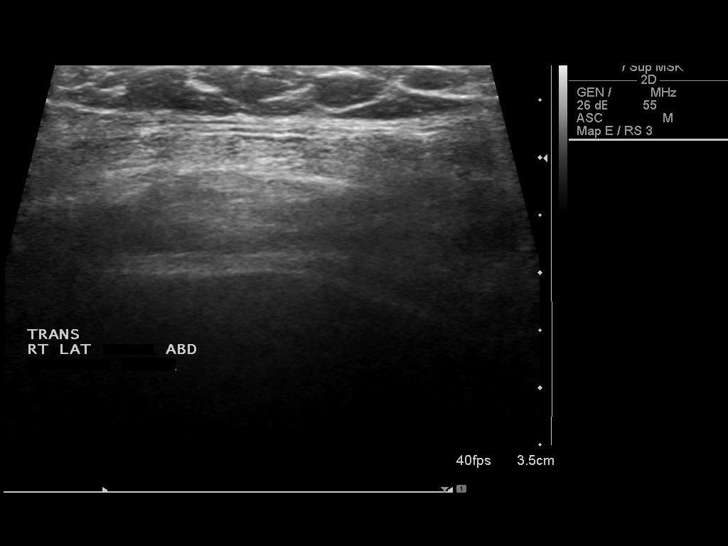
[im 3/13]
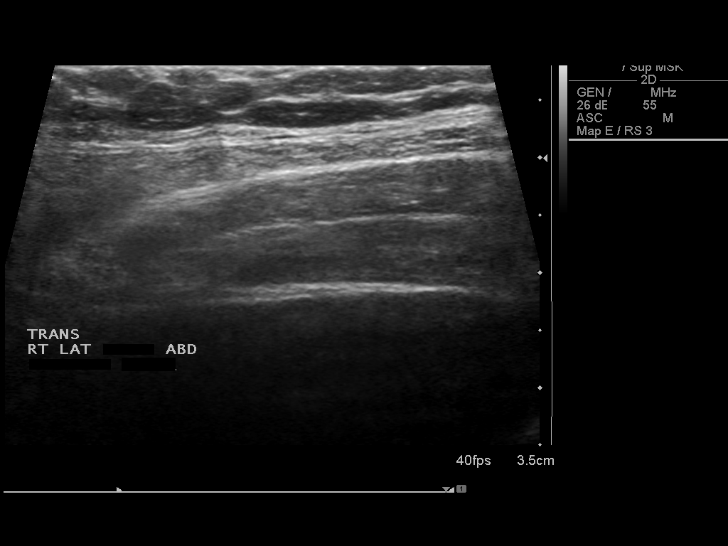
[im 4/13]
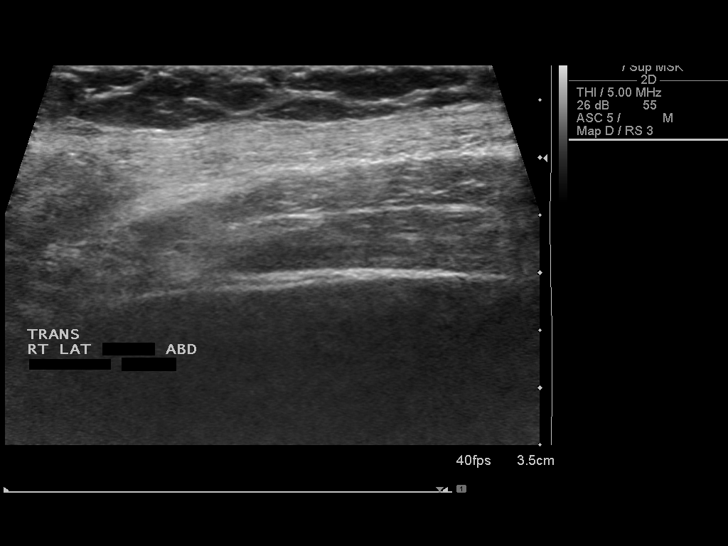
[im 5/13]
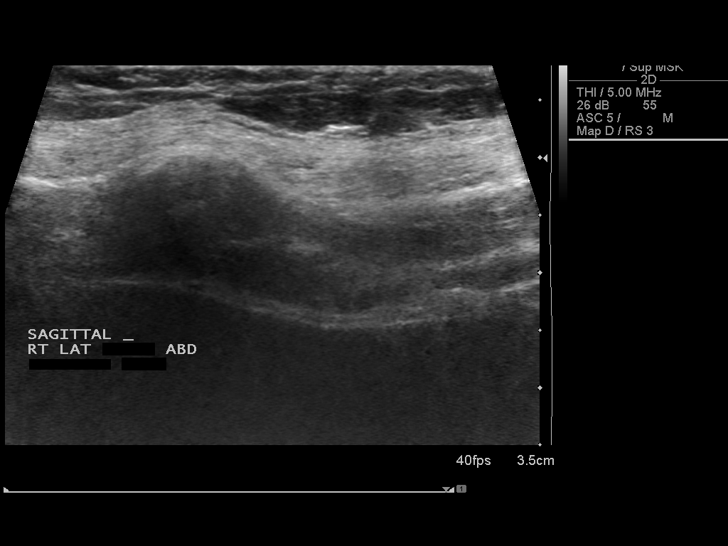
[im 6/13]
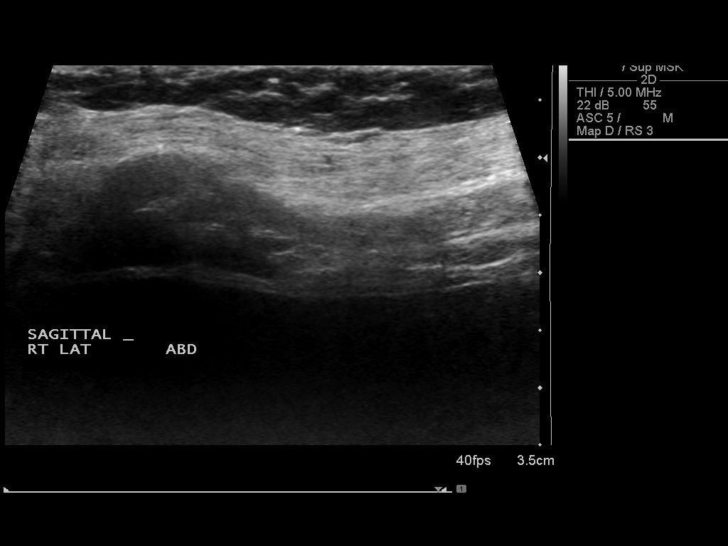
[im 7/13]
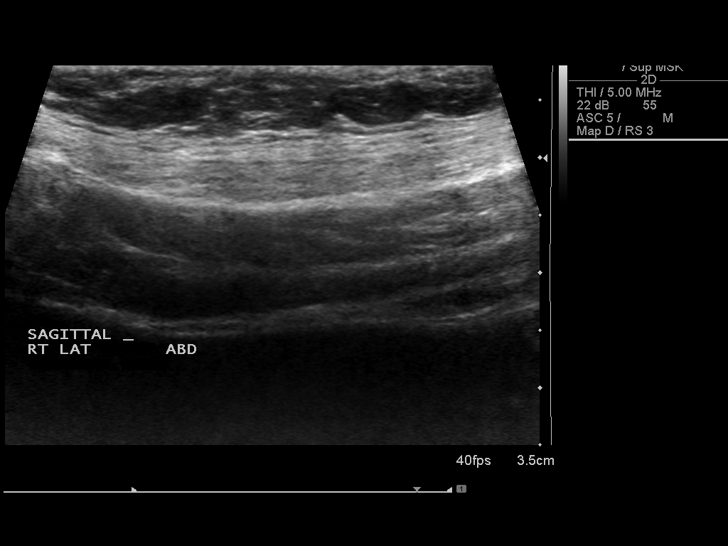
[im 8/13]
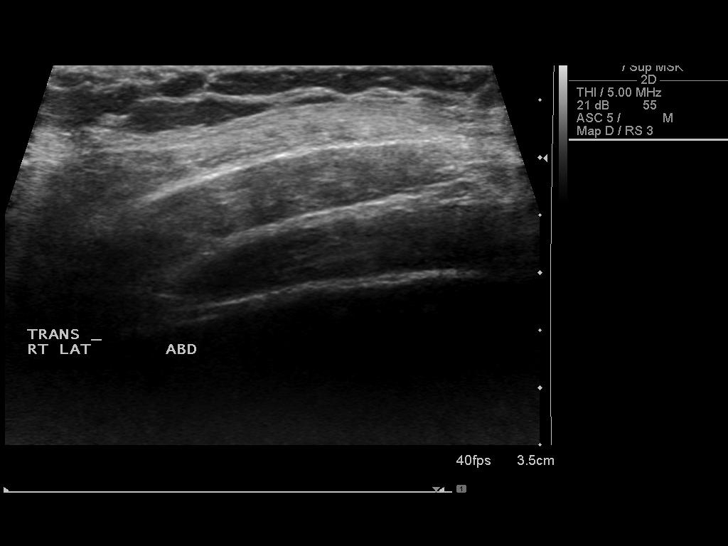
[im 9/13]
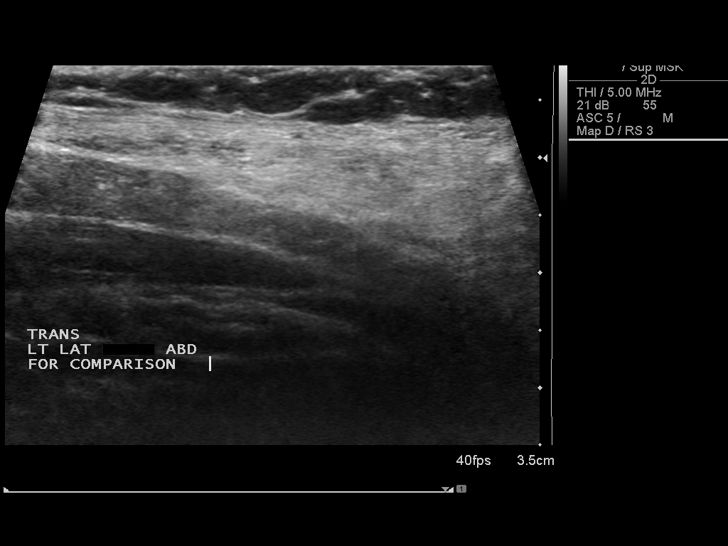
[im 10/13]
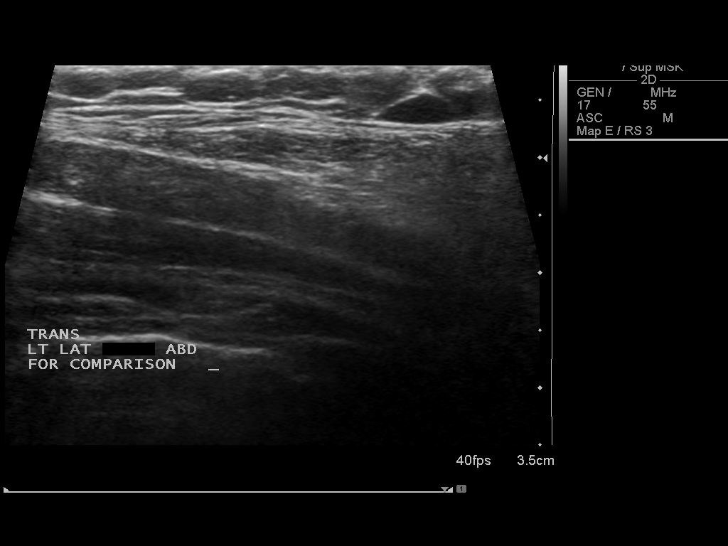
[im 11/13]
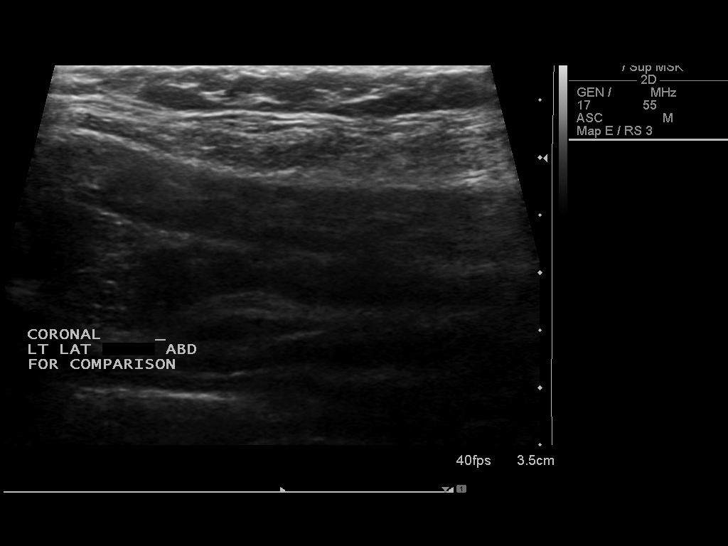
[im 12/13]
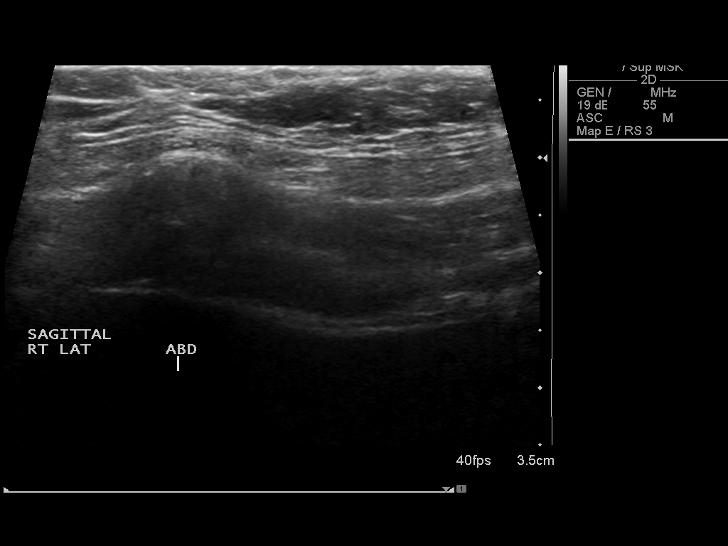
[im 13/13]
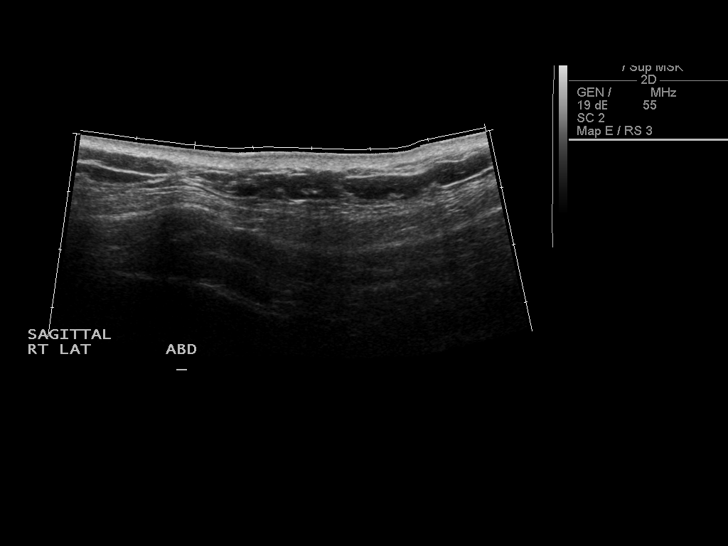

[13 of 13 positions shown; findings below may reference images not displayed]

FINDINGS: There is no sonographic evidence of a mass. There is no fluid
collection or evidence of a hernia. No sonographic correlate is seen
to the palpable area of concern.
IMPRESSION: Negative exam.  No evidence of a mass, cystic lesion or hernia.

## 2016-07-12 ENCOUNTER — Other Ambulatory Visit: Payer: Self-pay | Admitting: Family Medicine

## 2016-07-14 ENCOUNTER — Other Ambulatory Visit: Payer: Self-pay | Admitting: Physician Assistant

## 2016-07-14 DIAGNOSIS — M810 Age-related osteoporosis without current pathological fracture: Secondary | ICD-10-CM

## 2016-07-24 DIAGNOSIS — C50911 Malignant neoplasm of unspecified site of right female breast: Secondary | ICD-10-CM | POA: Diagnosis not present

## 2016-08-27 ENCOUNTER — Other Ambulatory Visit: Payer: Self-pay | Admitting: Family Medicine

## 2016-09-25 ENCOUNTER — Other Ambulatory Visit: Payer: Self-pay | Admitting: Family Medicine

## 2016-09-25 NOTE — Telephone Encounter (Signed)
Patient last seen 09/26/2015, please advise and route to pools 

## 2016-09-27 ENCOUNTER — Other Ambulatory Visit: Payer: Self-pay | Admitting: Internal Medicine

## 2016-09-27 ENCOUNTER — Ambulatory Visit (INDEPENDENT_AMBULATORY_CARE_PROVIDER_SITE_OTHER): Payer: Medicare Other | Admitting: Internal Medicine

## 2016-09-27 ENCOUNTER — Encounter: Payer: Self-pay | Admitting: Internal Medicine

## 2016-09-27 VITALS — BP 142/70 | HR 76 | Temp 97.3°F | Resp 16 | Ht 62.0 in | Wt 109.4 lb

## 2016-09-27 DIAGNOSIS — R7309 Other abnormal glucose: Secondary | ICD-10-CM | POA: Diagnosis not present

## 2016-09-27 DIAGNOSIS — E782 Mixed hyperlipidemia: Secondary | ICD-10-CM | POA: Diagnosis not present

## 2016-09-27 DIAGNOSIS — K2100 Gastro-esophageal reflux disease with esophagitis, without bleeding: Secondary | ICD-10-CM

## 2016-09-27 DIAGNOSIS — E559 Vitamin D deficiency, unspecified: Secondary | ICD-10-CM | POA: Diagnosis not present

## 2016-09-27 DIAGNOSIS — Z79899 Other long term (current) drug therapy: Secondary | ICD-10-CM | POA: Diagnosis not present

## 2016-09-27 DIAGNOSIS — I10 Essential (primary) hypertension: Secondary | ICD-10-CM

## 2016-09-27 LAB — CBC WITH DIFFERENTIAL/PLATELET
BASOS ABS: 75 {cells}/uL (ref 0–200)
Basophils Relative: 1 %
EOS PCT: 1 %
Eosinophils Absolute: 75 cells/uL (ref 15–500)
HCT: 38.4 % (ref 35.0–45.0)
HEMOGLOBIN: 12.7 g/dL (ref 11.7–15.5)
LYMPHS ABS: 900 {cells}/uL (ref 850–3900)
LYMPHS PCT: 12 %
MCH: 31.1 pg (ref 27.0–33.0)
MCHC: 33.1 g/dL (ref 32.0–36.0)
MCV: 93.9 fL (ref 80.0–100.0)
MONOS PCT: 6 %
MPV: 10.7 fL (ref 7.5–12.5)
Monocytes Absolute: 450 cells/uL (ref 200–950)
NEUTROS PCT: 80 %
Neutro Abs: 6000 cells/uL (ref 1500–7800)
Platelets: 293 10*3/uL (ref 140–400)
RBC: 4.09 MIL/uL (ref 3.80–5.10)
RDW: 12.9 % (ref 11.0–15.0)
WBC: 7.5 10*3/uL (ref 3.8–10.8)

## 2016-09-27 NOTE — Progress Notes (Signed)
[facility] Unk Pinto, M.D.        Uvaldo Bristle. Silverio Lay, P.A.-C       Starlyn Skeans, P.A.-C   [facility]                [address] [place] [id number] Telephone [phone] Telefax [phone] ______________________________________________________________________     This very nice 70 y.o.  WWF presents for belated  follow up with Hypertension, Hyperlipidemia, Pre-Diabetes and Vitamin D Deficiency.      Patient has hx/o Stage IA Ca of R breast (2013) tx'd by lumpectomy and adjuvant RT w/hx/o Intolerance to Letrozole.      Patient is followed expectantly for HTN circa 2004 when she presented with a TIA & BP has been controlled at home. Today's BP is 142/70.  Patient has had no complaints of any cardiac type chest pain, palpitations, dyspnea/orthopnea/PND, dizziness, claudication, or dependent edema.     Hyperlipidemia is controlled with diet.   Last Lipids were at goal: Lab Results  Component Value Date   CHOL 152 03/28/2016   HDL 87 03/28/2016   LDLCALC 48 03/28/2016   LDLDIRECT 147.6 06/30/2012   TRIG 83 03/28/2016   CHOLHDL 1.7 03/28/2016      Also, the patient is screened proactively for PreDiabetes and has had no symptoms of reactive hypoglycemia, diabetic polys, paresthesias or visual blurring.  Last A1c was at goal: Lab Results  Component Value Date   HGBA1C 5.5 03/28/2016      Further, the patient also has history of Vitamin D Deficiency in 2016 of "24"  and does not supplement vitamin D as recommended in the past. Last vitamin D was very low: Lab Results  Component Value Date   VD25OH 16 (L) 10/11/2015   Current Outpatient Prescriptions on File Prior to Visit  Medication Sig  . amLODipine (NORVASC) 5 MG tablet TAKE 1 TABLET (5 MG TOTAL) BY MOUTH DAILY.  Marland Kitchen aspirin 81 MG tablet Take 81 mg by mouth daily.  . polyethylene glycol (MIRALAX / GLYCOLAX) packet Take 17 g by  mouth 3 (three) times a week. Reported on 04/02/2016   No current facility-administered medications on file prior to visit.    No Known Allergies PMHx:   Past Medical History:  Diagnosis Date  . Abdominal aortic ectasia ([place]) 01/31/2010   Qualifier: Diagnosis of  By: [name] MD, [name]   . Adjustment disorder with depressed mood   . Anxiety   . Breast cancer ([place]) 03/11/12   right breast lumpectomy=invasive lobular ca,grade I/III,ER?PR=positive  . CEREBROVASCULAR DISEASE 06/16/2010   Qualifier: Diagnosis of  By: [name] MD, Hunt Oris   . CONSTIPATION 01/12/2009   Qualifier: Diagnosis of  By: [name] CMA ([place]), Mearl Latin    . Eosinophilic esophagitis   . ESOPHAGEAL STRICTURE 01/12/2009   Qualifier: Diagnosis of  By: [name] CMA ([place]), Mearl Latin    . EXTERNAL HEMORRHOIDS 01/12/2009   Qualifier: Diagnosis of  By: [name] CMA ([place]), Mearl Latin    . GERD 05/10/2007   Qualifier: Diagnosis of  By: [name] RMA, Lucy    . HYPERLIPIDEMIA 05/10/2007   Qualifier: Diagnosis of  By: [name], Lucy    . HYPERTENSION 05/10/2007   Qualifier: Diagnosis of  By: [name], Lucy    . IBS (irritable bowel syndrome)   . Incontinence   . Irritable bowel syndrome 01/12/2009   Qualifier: Diagnosis  of  By: [name] CMA ([place]), Mearl Latin    . Melanosis   . Orthostatic hypotension   . Overactive detrusor   . PERIPHERAL VASCULAR DISEASE 06/17/2010   Qualifier: Diagnosis of  By: [name] MD, Hunt Oris   . Radiation 04/21/2012-06/11/2012   64 gray right breast  . RECTAL FISSURE 02/16/2009   Qualifier: Diagnosis of  By: [name] CMA ([place]), Mearl Latin    . Rectal stenosis   . STENOSIS, RECTAL 02/16/2009   Qualifier: Diagnosis of  By: [name] CMA ([place]), Mearl Latin    . Tachyarrhythmia 09/03/2009   Qualifier: Diagnosis of  By: [name] MD, [place] ISCHEMIC ATTACK 03/02/2010   Qualifier: Diagnosis of  By: [name] MD, [name] VENOUS INSUFFICIENCY, LEGS 07/18/2010   Qualifier: Diagnosis of  By: [name] MD, [name]    Immunization  History  Administered Date(s) Administered  . Influenza Split 08/22/2011, 06/30/2012, 08/01/2016  . Influenza Whole 08/13/2008, 08/03/2009, 07/18/2010  . Influenza,inj,Quad PF,36+ Mos 07/28/2013, 07/22/2015  . Pneumococcal Conjugate-13 10/20/2013  . Pneumococcal Polysaccharide-23 08/13/2008  . Pneumococcal-Unspecified 11/12/2009  . Td 01/31/2010  . Tdap 08/22/2011   Past Surgical History:  Procedure Laterality Date  . ABDOMINAL HYSTERECTOMY    . BREAST LUMPECTOMY Right   . BREAST SURGERY Left    left breast x 2  . cardiac catherization    . CARDIAC CATHETERIZATION    . edg  04/21/2007  . ELECTROCARDIOGRAM  10/30/2006  . esophageal tumor excised; posterior 1986    . hemmorhoidectomy     x 3   FHx:    Reviewed / unchanged  SHx:    Reviewed / unchanged  Systems Review:  Constitutional: Denies fever, chills, wt changes, headaches, insomnia, fatigue, night sweats, change in appetite. Eyes: Denies redness, blurred vision, diplopia, discharge, itchy, watery eyes.  ENT: Denies discharge, congestion, post nasal drip, epistaxis, sore throat, earache, hearing loss, dental pain, tinnitus, vertigo, sinus pain, snoring.  CV: Denies chest pain, palpitations, irregular heartbeat, syncope, dyspnea, diaphoresis, orthopnea, PND, claudication or edema. Respiratory: denies cough, dyspnea, DOE, pleurisy, hoarseness, laryngitis, wheezing.  Gastrointestinal: Denies dysphagia, odynophagia, heartburn, reflux, water brash, abdominal pain or cramps, nausea, vomiting, bloating, diarrhea, constipation, hematemesis, melena, hematochezia  or hemorrhoids. Genitourinary: Denies dysuria, frequency, urgency, nocturia, hesitancy, discharge, hematuria or flank pain. Musculoskeletal: Denies arthralgias, myalgias, stiffness, jt. swelling, pain, limping or strain/sprain.  Skin: Denies pruritus, rash, hives, warts, acne, eczema or change in skin lesion(s). Neuro: No weakness, tremor, incoordination, spasms, paresthesia or  pain. Psychiatric: Denies confusion, memory loss or sensory loss. Endo: Denies change in weight, skin or hair change.  Heme/Lymph: No excessive bleeding, bruising or enlarged lymph nodes.  Physical Exam BP (!) 142/70   Pulse 76   Temp 97.3 F (36.3 C)   Resp 16   Ht 5\' 2"  (1.575 m)   Wt 109 lb 6.4 oz (49.6 kg)   BMI 20.01 kg/m   Appears well nourished and in no distress.  Eyes: PERRLA, EOMs, conjunctiva no swelling or erythema. Sinuses: No frontal/maxillary tenderness ENT/Mouth: EAC's clear, TM's nl w/o erythema, bulging. Nares clear w/o erythema, swelling, exudates. Oropharynx clear without erythema or exudates. Oral hygiene is good. Tongue normal, non obstructing. Hearing intact.  Neck: Supple. Thyroid nl. Car 2+/2+ without bruits, nodes or JVD. Chest: Respirations nl with BS clear & equal w/o rales, rhonchi, wheezing or stridor.  Cor: Heart sounds normal w/ regular rate and rhythm without sig. murmurs, gallops, clicks, or rubs. Peripheral pulses normal and equal  without edema.  Abdomen: Soft & bowel sounds normal. Non-tender w/o guarding, rebound, hernias, masses, or organomegaly.  Lymphatics: Unremarkable.  Musculoskeletal: Full ROM all peripheral extremities, joint stability, 5/5 strength, and normal gait.  Skin: Warm, dry without exposed rashes, lesions or ecchymosis apparent.  Neuro: Cranial nerves intact, reflexes equal bilaterally. Sensory-motor testing grossly intact. Tendon reflexes grossly intact.  Pysch: Alert & oriented x 3.  Insight and judgement nl & appropriate. No ideations.  Assessment and Plan:   1. Essential hypertension  - Continue medication, monitor blood pressure at home.  - Continue DASH diet. Reminder to go to the ER if any CP,  SOB, nausea, dizziness, severe HA, changes vision/speech,  left arm numbness and tingling and jaw pain. - CBC with Differential/Platelet - BASIC METABOLIC PANEL WITH GFR  2. Mixed hyperlipidemia  - Continue diet/meds,  exercise,& lifestyle modifications.  - Continue monitor periodic cholesterol/liver & renal functions  - Hepatic function panel - Lipid panel  3. Other abnormal glucose  - Continue diet, exercise, lifestyle modifications.  - Monitor appropriate labs. - Hemoglobin A1c - Insulin, random  4. Vitamin D deficiency  - Continue supplementation. - VITAMIN D 25 Hydroxy   5. Gastroesophageal reflux disease  6. Medication management  - CBC with Differential/Platelet - BASIC METABOLIC PANEL WITH GFR - Hepatic function panel - Magnesium       Recommended regular exercise, BP monitoring, weight control, and discussed med and SE's. Recommended labs to assess and monitor clinical status. Further disposition pending results of labs. Over 30 minutes of exam, counseling, chart review was performed

## 2016-09-27 NOTE — Patient Instructions (Signed)

## 2016-09-28 LAB — LIPID PANEL
CHOLESTEROL: 153 mg/dL (ref <200)
HDL: 70 mg/dL (ref >50)
LDL Cholesterol: 57 mg/dL (ref <100)
Total CHOL/HDL Ratio: 2.2 Ratio (ref <5.0)
Triglycerides: 132 mg/dL (ref <150)
VLDL: 26 mg/dL (ref <30)

## 2016-09-28 LAB — BASIC METABOLIC PANEL WITH GFR
BUN: 13 mg/dL (ref 7–25)
CALCIUM: 9.6 mg/dL (ref 8.6–10.4)
CO2: 28 mmol/L (ref 20–31)
CREATININE: 1.04 mg/dL — AB (ref 0.60–0.93)
Chloride: 101 mmol/L (ref 98–110)
GFR, Est African American: 62 mL/min (ref >=60)
GFR, Est Non African American: 54 mL/min — ABNORMAL LOW (ref >=60)
GLUCOSE: 77 mg/dL (ref 65–99)
Potassium: 4.1 mmol/L (ref 3.5–5.3)
SODIUM: 142 mmol/L (ref 135–146)

## 2016-09-28 LAB — HEPATIC FUNCTION PANEL
ALT: 16 U/L (ref 6–29)
AST: 21 U/L (ref 10–35)
Albumin: 4.4 g/dL (ref 3.6–5.1)
Alkaline Phosphatase: 79 U/L (ref 33–130)
BILIRUBIN DIRECT: 0.3 mg/dL — AB (ref <=0.2)
Indirect Bilirubin: 1.3 mg/dL — ABNORMAL HIGH (ref 0.2–1.2)
TOTAL PROTEIN: 6.7 g/dL (ref 6.1–8.1)
Total Bilirubin: 1.6 mg/dL — ABNORMAL HIGH (ref 0.2–1.2)

## 2016-09-28 LAB — MAGNESIUM: MAGNESIUM: 2.2 mg/dL (ref 1.5–2.5)

## 2016-09-28 LAB — HEMOGLOBIN A1C
Hgb A1c MFr Bld: 5.1 % (ref <5.7)
MEAN PLASMA GLUCOSE: 100 mg/dL

## 2016-09-28 LAB — INSULIN, RANDOM: INSULIN: 9.3 u[IU]/mL (ref 2.0–19.6)

## 2016-09-28 LAB — VITAMIN D 25 HYDROXY (VIT D DEFICIENCY, FRACTURES): VIT D 25 HYDROXY: 142 ng/mL — AB (ref 30–100)

## 2016-10-25 ENCOUNTER — Other Ambulatory Visit: Payer: Self-pay | Admitting: Family Medicine

## 2016-10-30 ENCOUNTER — Telehealth: Payer: Self-pay | Admitting: Family Medicine

## 2016-10-31 ENCOUNTER — Encounter: Payer: Self-pay | Admitting: Family Medicine

## 2016-10-31 ENCOUNTER — Ambulatory Visit (INDEPENDENT_AMBULATORY_CARE_PROVIDER_SITE_OTHER): Payer: Medicare Other | Admitting: Family Medicine

## 2016-10-31 VITALS — BP 130/81 | HR 79 | Temp 97.6°F | Ht 62.0 in | Wt 111.4 lb

## 2016-10-31 DIAGNOSIS — I10 Essential (primary) hypertension: Secondary | ICD-10-CM

## 2016-10-31 MED ORDER — AMLODIPINE BESYLATE 5 MG PO TABS: 5.0000 mg | ORAL_TABLET | Freq: Every day | ORAL | 3 refills | Status: DC

## 2016-10-31 NOTE — Progress Notes (Signed)
   BP 130/81   Pulse 79   Temp 97.6 F (36.4 C) (Oral)   Ht 5\' 2"  (1.575 m)   Wt 111 lb 6.4 oz (50.5 kg)   BMI 20.38 kg/m    Subjective:    Patient ID: Crystal [name], female    DOB: May 01, 1946, 71 y.o.   MRN: [id number]  HPI: Crystal [name] is a 71 y.o. female presenting on 10/31/2016 for Medication Refill (Amlodipine)   HPI Hypertension recheck Patient is coming in today for a hypertension recheck. She is currently on amlodipine 5 mg. She says that she has the occasional dizziness but otherwise no side effects from medication. Her blood pressure today is 130/81. Patient denies headaches, blurred vision, chest pains, shortness of breath, or weakness. Denies any side effects from medication and is content with current medication.   Relevant past medical, surgical, family and social history reviewed and updated as indicated. Interim medical history since our last visit reviewed. Allergies and medications reviewed and updated.  Review of Systems  Constitutional: Negative for chills and fever.  Respiratory: Negative for chest tightness and shortness of breath.   Cardiovascular: Negative for chest pain and leg swelling.  Genitourinary: Negative for difficulty urinating and dysuria.  Musculoskeletal: Negative for back pain and gait problem.  Skin: Negative for rash.  Neurological: Positive for dizziness. Negative for weakness, light-headedness, numbness and headaches.  Psychiatric/Behavioral: Negative for agitation and behavioral problems.  All other systems reviewed and are negative.   Per HPI unless specifically indicated above     Objective:    BP 130/81   Pulse 79   Temp 97.6 F (36.4 C) (Oral)   Ht 5\' 2"  (1.575 m)   Wt 111 lb 6.4 oz (50.5 kg)   BMI 20.38 kg/m   Wt Readings from Last 3 Encounters:  10/31/16 111 lb 6.4 oz (50.5 kg)  09/27/16 109 lb 6.4 oz (49.6 kg)  04/02/16 109 lb 3.2 oz (49.5 kg)    Physical Exam  Constitutional: She is oriented to person,  place, and time. She appears well-developed and well-nourished. No distress.  Eyes: Conjunctivae are normal.  Neck: Neck supple. No thyromegaly present.  Cardiovascular: Normal rate, regular rhythm, normal heart sounds and intact distal pulses.   No murmur heard. Pulmonary/Chest: Effort normal and breath sounds normal. No respiratory distress. She has no wheezes. She has no rales.  Musculoskeletal: Normal range of motion. She exhibits no edema or tenderness.  Lymphadenopathy:    She has no cervical adenopathy.  Neurological: She is alert and oriented to person, place, and time. Coordination normal.  Skin: Skin is warm and dry. No rash noted. She is not diaphoretic.  Psychiatric: She has a normal mood and affect. Her behavior is normal.  Nursing note and vitals reviewed.     Assessment & Plan:   Problem List Items Addressed This Visit      Cardiovascular and Mediastinum   Essential hypertension - Primary   Relevant Medications   amLODipine (NORVASC) 5 MG tablet       Follow up plan: Return in about 1 year (around 10/31/2017), or if symptoms worsen or fail to improve.  Counseling provided for all of the vaccine components No orders of the defined types were placed in this encounter.   Caryl Pina, MD [facility] 10/31/2016, 8:47 AM

## 2016-11-02 ENCOUNTER — Other Ambulatory Visit: Payer: Self-pay | Admitting: Family Medicine

## 2016-11-12 ENCOUNTER — Ambulatory Visit
Admission: RE | Admit: 2016-11-12 | Discharge: 2016-11-12 | Disposition: A | Discharge status: Home / Self Care | Payer: Medicare Other | Source: Ambulatory Visit | Attending: Nurse Practitioner | Admitting: Nurse Practitioner

## 2016-11-12 DIAGNOSIS — R922 Inconclusive mammogram: Secondary | ICD-10-CM | POA: Diagnosis not present

## 2016-11-12 DIAGNOSIS — C50311 Malignant neoplasm of lower-inner quadrant of right female breast: Secondary | ICD-10-CM

## 2016-12-27 ENCOUNTER — Ambulatory Visit (INDEPENDENT_AMBULATORY_CARE_PROVIDER_SITE_OTHER): Payer: Medicare Other | Admitting: Internal Medicine

## 2016-12-27 ENCOUNTER — Encounter: Payer: Self-pay | Admitting: Internal Medicine

## 2016-12-27 VITALS — BP 142/72 | HR 76 | Temp 98.0°F | Resp 16 | Ht 62.0 in | Wt 110.0 lb

## 2016-12-27 DIAGNOSIS — Z79899 Other long term (current) drug therapy: Secondary | ICD-10-CM

## 2016-12-27 DIAGNOSIS — I10 Essential (primary) hypertension: Secondary | ICD-10-CM

## 2016-12-27 DIAGNOSIS — M546 Pain in thoracic spine: Secondary | ICD-10-CM | POA: Diagnosis not present

## 2016-12-27 DIAGNOSIS — E782 Mixed hyperlipidemia: Secondary | ICD-10-CM

## 2016-12-27 DIAGNOSIS — R7309 Other abnormal glucose: Secondary | ICD-10-CM | POA: Diagnosis not present

## 2016-12-27 DIAGNOSIS — E559 Vitamin D deficiency, unspecified: Secondary | ICD-10-CM

## 2016-12-27 LAB — BASIC METABOLIC PANEL WITH GFR
BUN: 11 mg/dL (ref 7–25)
CALCIUM: 9.5 mg/dL (ref 8.6–10.4)
CO2: 27 mmol/L (ref 20–31)
Chloride: 105 mmol/L (ref 98–110)
Creat: 0.71 mg/dL (ref 0.60–0.93)
GFR, EST NON AFRICAN AMERICAN: 85 mL/min (ref >=60)
GLUCOSE: 85 mg/dL (ref 65–99)
POTASSIUM: 4 mmol/L (ref 3.5–5.3)
Sodium: 142 mmol/L (ref 135–146)

## 2016-12-27 LAB — HEPATIC FUNCTION PANEL
ALBUMIN: 4.4 g/dL (ref 3.6–5.1)
ALK PHOS: 89 U/L (ref 33–130)
ALT: 14 U/L (ref 6–29)
AST: 21 U/L (ref 10–35)
BILIRUBIN TOTAL: 1.5 mg/dL — AB (ref 0.2–1.2)
Bilirubin, Direct: 0.3 mg/dL — ABNORMAL HIGH (ref <=0.2)
Indirect Bilirubin: 1.2 mg/dL (ref 0.2–1.2)
TOTAL PROTEIN: 6.8 g/dL (ref 6.1–8.1)

## 2016-12-27 LAB — CBC WITH DIFFERENTIAL/PLATELET
BASOS PCT: 0 %
Basophils Absolute: 0 cells/uL (ref 0–200)
Eosinophils Absolute: 85 cells/uL (ref 15–500)
Eosinophils Relative: 1 %
HEMATOCRIT: 36.9 % (ref 35.0–45.0)
Hemoglobin: 12.5 g/dL (ref 11.7–15.5)
LYMPHS PCT: 12 %
Lymphs Abs: 1020 cells/uL (ref 850–3900)
MCH: 31.4 pg (ref 27.0–33.0)
MCHC: 33.9 g/dL (ref 32.0–36.0)
MCV: 92.7 fL (ref 80.0–100.0)
MONOS PCT: 6 %
MPV: 10.4 fL (ref 7.5–12.5)
Monocytes Absolute: 510 cells/uL (ref 200–950)
NEUTROS ABS: 6885 {cells}/uL (ref 1500–7800)
NEUTROS PCT: 81 %
PLATELETS: 284 10*3/uL (ref 140–400)
RBC: 3.98 MIL/uL (ref 3.80–5.10)
RDW: 12.9 % (ref 11.0–15.0)
WBC: 8.5 10*3/uL (ref 3.8–10.8)

## 2016-12-27 LAB — LIPID PANEL
Cholesterol: 155 mg/dL (ref <200)
HDL: 71 mg/dL (ref >50)
LDL CALC: 62 mg/dL (ref <100)
Total CHOL/HDL Ratio: 2.2 Ratio (ref <5.0)
Triglycerides: 110 mg/dL (ref <150)
VLDL: 22 mg/dL (ref <30)

## 2016-12-27 MED ORDER — OMEPRAZOLE 40 MG PO CPDR: 40.0000 mg | DELAYED_RELEASE_CAPSULE | Freq: Every day | ORAL | 1 refills | Status: DC

## 2016-12-27 NOTE — Progress Notes (Signed)
Assessment and Plan:  Hypertension:  -cont amlodipine -patient aware we will not tolerate her seeing other practices for her BP -Continue medication,  -monitor blood pressure at home.  -Continue DASH diet.   -Reminder to go to the ER if any CP, SOB, nausea, dizziness, severe HA, changes vision/speech, left arm numbness and tingling, and jaw pain.  Cholesterol: -cont statin therapy -recheck today -Continue diet and exercise.  -Check cholesterol.   Pre-diabetes: -Continue diet and exercise.   Vitamin D Def: -check level -continue medications.  -dose was decreased after last visit.  Dose adjust if necessary  Thoracic back pain -Musculoskeletal exam normal -given history of GERD and esophageal mass will try PPI for a month if no relief patient needs to return to GI -has history of esophageal dysmotility   Continue diet and meds as discussed. Further disposition pending results of labs.  HPI 71 y.o. female  presents for 3 month follow up with hypertension, hyperlipidemia, prediabetes and vitamin D.   Her blood pressure has been controlled at home, today their BP is BP: (!) 142/72.   She does not workout. She denies chest pain, shortness of breath, dizziness.  She does not typically check it at home.  She does tend to have some shortness of breath.  She gets that when she is doing some exertional activity.  This goes away when she stops.     She is not on cholesterol medication and denies myalgias. Her cholesterol is at goal. The cholesterol last visit was:   Lab Results  Component Value Date   CHOL 153 09/27/2016   HDL 70 09/27/2016   LDLCALC 57 09/27/2016   LDLDIRECT 147.6 06/30/2012   TRIG 132 09/27/2016   CHOLHDL 2.2 09/27/2016     She has been working on diet and exercise for prediabetes, and denies foot ulcerations, hyperglycemia, hypoglycemia , increased appetite, nausea, paresthesia of the feet, polydipsia, polyuria, visual disturbances, vomiting and weight loss. Last  A1C in the office was:  Lab Results  Component Value Date   HGBA1C 5.1 09/27/2016    Patient is on Vitamin D supplement.  Lab Results  Component Value Date   VD25OH 142 (H) 09/27/2016    She did cut back on her Vitamin D.  She did carry throught with this.      She has been having some pain intermittently between her shoulder blades for 30 minutes or less.  She has no triggers.  It does not seem to be affected by what she eats. She does not have any improvement or worsening with movements.  She reports that she is not doing any new activity.  She has not had issues with her back prior.  She does have some trouble with swallowing.  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  She has had to regurgitate. No water brash.  She does not have burning pain in her stomach.  Her last endoscopy was 2011.     Current Medications:  Current Outpatient Prescriptions on File Prior to Visit  Medication Sig Dispense Refill  . amLODipine (NORVASC) 5 MG tablet Take 1 tablet (5 mg total) by mouth daily. 90 tablet 3  . aspirin 81 MG tablet Take 81 mg by mouth daily.    Marland Kitchen atorvastatin (LIPITOR) 80 MG tablet TAKE 1/2 TABLET OR AS DIRECTED FOR CHOLESTEROL 30 tablet 5  . polyethylene glycol (MIRALAX / GLYCOLAX) packet Take 17 g by mouth 3 (three) times a week. Reported  on 04/02/2016     No current facility-administered medications on file prior to visit.     Medical History:  Past Medical History:  Diagnosis Date  . Abdominal aortic ectasia ([place]) 01/31/2010   Qualifier: Diagnosis of  By: [name] MD, [name]   . Adjustment disorder with depressed mood   . Anxiety   . Breast cancer ([place]) 03/11/12   right breast lumpectomy=invasive lobular ca,grade I/III,ER?PR=positive  . CEREBROVASCULAR DISEASE 06/16/2010   Qualifier: Diagnosis of  By: [name] MD, Hunt Oris   . CONSTIPATION 01/12/2009   Qualifier: Diagnosis of  By: [name] CMA ([place]), Mearl Latin    . Eosinophilic esophagitis   .  ESOPHAGEAL STRICTURE 01/12/2009   Qualifier: Diagnosis of  By: [name] CMA ([place]), Mearl Latin    . EXTERNAL HEMORRHOIDS 01/12/2009   Qualifier: Diagnosis of  By: [name] CMA ([place]), Mearl Latin    . GERD 05/10/2007   Qualifier: Diagnosis of  By: [name] RMA, Lucy    . HYPERLIPIDEMIA 05/10/2007   Qualifier: Diagnosis of  By: [name], Lucy    . HYPERTENSION 05/10/2007   Qualifier: Diagnosis of  By: [name], Lucy    . IBS (irritable bowel syndrome)   . Incontinence   . Irritable bowel syndrome 01/12/2009   Qualifier: Diagnosis of  By: [name] CMA ([place]), Mearl Latin    . Melanosis   . Orthostatic hypotension   . Overactive detrusor   . PERIPHERAL VASCULAR DISEASE 06/17/2010   Qualifier: Diagnosis of  By: [name] MD, Hunt Oris   . Radiation 04/21/2012-06/11/2012   64 gray right breast  . RECTAL FISSURE 02/16/2009   Qualifier: Diagnosis of  By: [name] CMA ([place]), Mearl Latin    . Rectal stenosis   . STENOSIS, RECTAL 02/16/2009   Qualifier: Diagnosis of  By: [name] CMA ([place]), Mearl Latin    . Tachyarrhythmia 09/03/2009   Qualifier: Diagnosis of  By: [name] MD, [place] ISCHEMIC ATTACK 03/02/2010   Qualifier: Diagnosis of  By: [name] MD, [name] VENOUS INSUFFICIENCY, LEGS 07/18/2010   Qualifier: Diagnosis of  By: [name] MD, [name]     Allergies: No Known Allergies   Review of Systems:  Review of Systems  Constitutional: Negative for chills, fever and malaise/fatigue.  HENT: Negative for congestion, ear pain and sore throat.   Eyes: Negative.   Respiratory: Negative for cough, shortness of breath and wheezing.   Cardiovascular: Negative for chest pain, palpitations and leg swelling.  Gastrointestinal: Positive for heartburn and vomiting. Negative for abdominal pain, blood in stool, constipation, diarrhea and melena.       Trouble swallowing  Genitourinary: Negative.   Skin: Negative.   Neurological: Negative for dizziness, sensory change, loss of consciousness and headaches.   Psychiatric/Behavioral: Negative for depression. The patient is not nervous/anxious and does not have insomnia.     Family history- Review and unchanged  Social history- Review and unchanged  Physical Exam: BP (!) 142/72   Pulse 76   Temp 98 F (36.7 C) (Temporal)   Resp 16   Ht 5\' 2"  (1.575 m)   Wt 110 lb (49.9 kg)   BMI 20.12 kg/m  Wt Readings from Last 3 Encounters:  12/27/16 110 lb (49.9 kg)  10/31/16 111 lb 6.4 oz (50.5 kg)  09/27/16 109 lb 6.4 oz (49.6 kg)    General Appearance: Well nourished well developed, in no apparent distress. Eyes: PERRLA, EOMs, conjunctiva no swelling or erythema ENT/Mouth: Ear canals normal without obstruction, swelling, erythma, discharge.  TMs normal bilaterally.  Oropharynx moist, clear, without exudate, or postoropharyngeal swelling. Neck: Supple, thyroid normal,no cervical adenopathy  Respiratory: Respiratory effort normal, Breath sounds clear A&P without rhonchi, wheeze, or rale.  No retractions, no accessory usage. Cardio: RRR with no MRGs. Brisk peripheral pulses without edema.  Abdomen: Soft, + BS,  Non tender, no guarding, rebound, hernias, masses. Musculoskeletal: Full ROM, 5/5 strength, Normal gait Skin: Warm, dry without rashes, lesions, ecchymosis.  Neuro: Awake and oriented X 3, Cranial nerves intact. Normal muscle tone, no cerebellar symptoms. Psych: Normal affect, Insight and Judgment appropriate.    Starlyn Skeans, PA-C 10:32 AM [facility]

## 2016-12-28 LAB — VITAMIN D 25 HYDROXY (VIT D DEFICIENCY, FRACTURES): VIT D 25 HYDROXY: 101 ng/mL — AB (ref 30–100)

## 2017-01-31 DIAGNOSIS — H353121 Nonexudative age-related macular degeneration, left eye, early dry stage: Secondary | ICD-10-CM | POA: Diagnosis not present

## 2017-01-31 DIAGNOSIS — Z961 Presence of intraocular lens: Secondary | ICD-10-CM | POA: Diagnosis not present

## 2017-01-31 DIAGNOSIS — H18413 Arcus senilis, bilateral: Secondary | ICD-10-CM | POA: Diagnosis not present

## 2017-01-31 DIAGNOSIS — H353111 Nonexudative age-related macular degeneration, right eye, early dry stage: Secondary | ICD-10-CM | POA: Diagnosis not present

## 2017-01-31 DIAGNOSIS — H5203 Hypermetropia, bilateral: Secondary | ICD-10-CM | POA: Diagnosis not present

## 2017-01-31 DIAGNOSIS — H43393 Other vitreous opacities, bilateral: Secondary | ICD-10-CM | POA: Diagnosis not present

## 2017-04-01 ENCOUNTER — Encounter: Payer: Self-pay | Admitting: Physician Assistant

## 2017-04-01 DIAGNOSIS — M9902 Segmental and somatic dysfunction of thoracic region: Secondary | ICD-10-CM | POA: Diagnosis not present

## 2017-04-01 DIAGNOSIS — M5033 Other cervical disc degeneration, cervicothoracic region: Secondary | ICD-10-CM | POA: Diagnosis not present

## 2017-04-03 DIAGNOSIS — M9902 Segmental and somatic dysfunction of thoracic region: Secondary | ICD-10-CM | POA: Diagnosis not present

## 2017-04-03 DIAGNOSIS — M5033 Other cervical disc degeneration, cervicothoracic region: Secondary | ICD-10-CM | POA: Diagnosis not present

## 2017-04-04 DIAGNOSIS — M5033 Other cervical disc degeneration, cervicothoracic region: Secondary | ICD-10-CM | POA: Diagnosis not present

## 2017-04-04 DIAGNOSIS — M9902 Segmental and somatic dysfunction of thoracic region: Secondary | ICD-10-CM | POA: Diagnosis not present

## 2017-04-08 DIAGNOSIS — M5033 Other cervical disc degeneration, cervicothoracic region: Secondary | ICD-10-CM | POA: Diagnosis not present

## 2017-04-08 DIAGNOSIS — M9902 Segmental and somatic dysfunction of thoracic region: Secondary | ICD-10-CM | POA: Diagnosis not present

## 2017-04-10 DIAGNOSIS — M9902 Segmental and somatic dysfunction of thoracic region: Secondary | ICD-10-CM | POA: Diagnosis not present

## 2017-04-10 DIAGNOSIS — M5033 Other cervical disc degeneration, cervicothoracic region: Secondary | ICD-10-CM | POA: Diagnosis not present

## 2017-04-11 DIAGNOSIS — M5033 Other cervical disc degeneration, cervicothoracic region: Secondary | ICD-10-CM | POA: Diagnosis not present

## 2017-04-11 DIAGNOSIS — M9902 Segmental and somatic dysfunction of thoracic region: Secondary | ICD-10-CM | POA: Diagnosis not present

## 2017-04-15 DIAGNOSIS — M9902 Segmental and somatic dysfunction of thoracic region: Secondary | ICD-10-CM | POA: Diagnosis not present

## 2017-04-15 DIAGNOSIS — M5033 Other cervical disc degeneration, cervicothoracic region: Secondary | ICD-10-CM | POA: Diagnosis not present

## 2017-04-17 DIAGNOSIS — M9902 Segmental and somatic dysfunction of thoracic region: Secondary | ICD-10-CM | POA: Diagnosis not present

## 2017-04-17 DIAGNOSIS — M5033 Other cervical disc degeneration, cervicothoracic region: Secondary | ICD-10-CM | POA: Diagnosis not present

## 2017-04-18 DIAGNOSIS — M5033 Other cervical disc degeneration, cervicothoracic region: Secondary | ICD-10-CM | POA: Diagnosis not present

## 2017-04-18 DIAGNOSIS — M9902 Segmental and somatic dysfunction of thoracic region: Secondary | ICD-10-CM | POA: Diagnosis not present

## 2017-04-21 NOTE — Progress Notes (Signed)
MEDICARE ANNUAL WELLNESS VISIT AND CPE  Assessment:   HYPERTENSION - CBC with Differential - BASIC METABOLIC PANEL WITH GFR - Hepatic function panel - TSH - Urinalysis, Routine w reflex microscopic - Microalbumin / creatinine urine ratio - EKG 12-Lead  PERIPHERAL VASCULAR DISEASE Elevate legs, compression stockings  HYPERLIPIDEMIA - Lipid panel   Insomnia-  -good sleep hygiene discussed, increase day time activity, try melatonin or benadryl if this does not help we will call in sleep medication.    Elevated glucose Check A1C - Hemoglobin A1c  Unspecified vitamin D deficiency  .Encounter for long-term (current) use of other medications - Magnesium  IBS Diet controlled   Osteoporosis, unspecified -  get on vitamin D/calcium and discussed strength training.  - DG Bone Density; will get with [facility] in Jan  1History of TIA/CVA Continue ASA, only on 81mg  Control blood pressure, cholesterol, glucose, increase exercise.   GERD  H2  History of breast cancer Continue close follow up   Venous (peripheral) insufficiency continue compression stockings and elevation  Cerebrovascular disease Control blood pressure, cholesterol, glucose, increase exercise.continue ASA   STENOSIS, RECTAL  Urinary frequency -     Urinalysis, Routine w reflex microscopic -     Urine Culture  Future Appointments Date Time Provider [place]  04/23/2018 10:00 AM Vicie Mutters, PA-C [id number] None     Plan:   During the course of the visit the patient was educated and counseled about appropriate screening and preventive services including:    Pneumococcal vaccine   Influenza vaccine  Td vaccine  Screening electrocardiogram  Screening mammography  Bone densitometry screening  Colorectal cancer screening  Diabetes screening  Glaucoma screening  Nutrition counseling   Advanced directives: given information/requested   Subjective:   Crystal [name] is a  71 y.o. female who presents for Medicare Annual Wellness Visit and complete physical.   She is complaining of frequent urination, some incontinence x 1 year, no discomfort, nocturia x 3-4, no blood in urine, back pain, vaginal dryness.  Has bruising on arms and hands, but no where else, no spontaneous bruises, no nose bleeds, blood in stool/urine.   Her blood pressure has not been checked at home, today their BP is BP: 138/78. She does workout, walks some, yardwork. She denies chest pain, shortness of breath, dizziness.  She is not on cholesterol medication and denies myalgias. Her cholesterol is not at goal. The cholesterol last visit was:   Lab Results  Component Value Date   CHOL 155 12/27/2016   HDL 71 12/27/2016   LDLCALC 62 12/27/2016   LDLDIRECT 147.6 06/30/2012   TRIG 110 12/27/2016   CHOLHDL 2.2 12/27/2016   Lab Results  Component Value Date   HGBA1C 5.1 09/27/2016   Patient is not on Vitamin D supplement.  Lab Results  Component Value Date   VD25OH 101 (H) 12/27/2016    She continues to see her Oncologist and get MRI breast yearly.  She has IBS/constipatoin and is on miralax which helps. Has followed with Dr. Lorin Mercy, has cervical OA, currently following with chiropractor.  BMI is Body mass index is 20.12 kg/m., she is working on diet and exercise. Wt Readings from Last 3 Encounters:  04/23/17 110 lb (49.9 kg)  12/27/16 110 lb (49.9 kg)  10/31/16 111 lb 6.4 oz (50.5 kg)     Names of Other Physician/Practitioners you currently use: 1. [facility]- here for primary care 2. Dr. Sabra Heck, eye doctor, 10/2016 3. Dr. Talbert Forest  Sept 2015, cataract removal 4.  Dr. Berdine Addison, dentist, 10/2016 5. Dr. Lorin Mercy, ortho Patient Care Team: Unk Pinto, MD as PCP - General (Internal Medicine)   Medication Review Current Outpatient Prescriptions on File Prior to Visit  Medication Sig Dispense Refill  . amLODipine (NORVASC) 5 MG tablet Take 1  tablet (5 mg total) by mouth daily. 90 tablet 3  . aspirin 81 MG tablet Take 81 mg by mouth daily.    Marland Kitchen atorvastatin (LIPITOR) 80 MG tablet TAKE 1/2 TABLET OR AS DIRECTED FOR CHOLESTEROL 30 tablet 5  . omeprazole (PRILOSEC) 40 MG capsule Take 1 capsule (40 mg total) by mouth daily. 30 capsule 1  . polyethylene glycol (MIRALAX / GLYCOLAX) packet Take 17 g by mouth 3 (three) times a week. Reported on 04/02/2016     No current facility-administered medications on file prior to visit.     Current Problems (verified) Patient Active Problem List   Diagnosis Date Noted  . Vitamin D deficiency 10/11/2015  . Other abnormal glucose 10/11/2015  . Medication management 10/11/2015  . Osteoporosis 03/28/2015  . Breast cancer of lower-inner quadrant of right female breast ([place]) 07/20/2013  . Venous (peripheral) insufficiency 07/18/2010  . Peripheral vascular disease ([place]) 06/17/2010  . Cerebrovascular disease 06/16/2010  . Transient cerebral ischemia 03/02/2010  . STENOSIS, RECTAL 02/16/2009  . Irritable bowel syndrome 01/12/2009  . Hyperlipidemia 05/10/2007  . Essential hypertension 05/10/2007  . GERD 05/10/2007   Screening Tests Immunization History  Administered Date(s) Administered  . Influenza Split 08/22/2011, 06/30/2012, 08/01/2016  . Influenza Whole 08/13/2008, 08/03/2009, 07/18/2010  . Influenza,inj,Quad PF,36+ Mos 07/28/2013, 07/22/2015  . Pneumococcal Conjugate-13 10/20/2013  . Pneumococcal Polysaccharide-23 08/13/2008  . Pneumococcal-Unspecified 11/12/2009  . Td 01/31/2010  . Tdap 08/22/2011   Preventative care: Last colonoscopy: 2010 Dr. Sharlett Iles but is retired, due 2020 Last mammogram: 10/2016, 1 year MRI breast 03/2015 Last pap smear/pelvic exam: 2010   DEXA: 03/2015 + osteoporsis DUE this year CT chest 06/2013 [place] AB 2016 Echo 2009  Prior vaccinations: TD or Tdap: 2012  Influenza: 2017  Pneumococcal: 2009 Prevnar 13: 2014 Shingles/Zostavax: 12/2011   History  reviewed: allergies, current medications, past family history, past medical history, past social history, past surgical history and problem list  Allergies No Known Allergies  SURGICAL HISTORY She  has a past surgical history that includes Abdominal hysterectomy; hemmorhoidectomy; cardiac catherization; esophageal tumor excised; posterior 1986; electrocardiogram (10/30/2006); edg (04/21/2007); Breast surgery (Left); Cardiac catheterization; and Breast lumpectomy (Right). FAMILY HISTORY Her family history includes Bone cancer in her father; Heart disease in her brother. SOCIAL HISTORY She  reports that she quit smoking about 51 years ago. Her smoking use included Cigarettes. She has never used smokeless tobacco. She reports that she drinks about 1.8 oz of alcohol per week . She reports that she does not use drugs.  MEDICARE WELLNESS OBJECTIVES: Physical activity:   Cardiac risk factors:   Depression/mood screen:   Depression screen [facility] 2/9 10/31/2016  Decreased Interest 0  Down, Depressed, Hopeless 0  PHQ - 2 Score 0    ADLs:  In your present state of health, do you have any difficulty performing the following activities: 09/27/2016  Hearing? N  Vision? N  Difficulty concentrating or making decisions? N  Walking or climbing stairs? N  Dressing or bathing? N  Doing errands, shopping? N  Some recent data might be hidden     Cognitive Testing  Alert? Yes  Normal Appearance?Yes  Oriented to person? Yes  Place? Yes  Time? Yes  Recall of three objects?  Yes  Can perform simple calculations? Yes  Displays appropriate judgment?Yes  Can read the correct time from a watch face?Yes  EOL planning:     Objective:     Blood pressure 138/78, pulse 82, temperature 98.4 F (36.9 C), height 5\' 2"  (1.575 m), weight 110 lb (49.9 kg). Body mass index is 20.12 kg/m.  General appearance: alert, no distress, WD/WN,  female HEENT: normocephalic, sclerae anicteric, TMs pearly, nares patent, no  discharge or erythema, pharynx normal Oral cavity: MMM, no lesions Neck: supple, no lymphadenopathy, no thyromegaly, no masses Heart: RRR, normal S1, S2, no murmurs Lungs: CTA bilaterally, no wheezes, rhonchi, or rales Abdomen: +bs, soft, non tender, non distended, no masses, no hepatomegaly, no splenomegaly Musculoskeletal: nontender, no swelling, no obvious deformity Extremities: no edema, no cyanosis, no clubbing Pulses: 2+ symmetric, upper and lower extremities, normal cap refill Neurological: alert, oriented x 3, CN2-12 intact, strength normal upper extremities and lower extremities, sensation normal throughout, DTRs 2+ throughout, no cerebellar signs, gait normal Psychiatric: normal affect, behavior normal, pleasant  Skin: On left anterior medial leg, scaly erythematous annular lesion.  Breast: defer Gyn: defer  Rectal: defer  Medicare Attestation I have personally reviewed: The patient's medical and social history Their use of alcohol, tobacco or illicit drugs Their current medications and supplements The patient's functional ability including ADLs,fall risks, home safety risks, cognitive, and hearing and visual impairment Diet and physical activities Evidence for depression or mood disorders  The patient's weight, height, BMI, and visual acuity have been recorded in the chart.  I have made referrals, counseling, and provided education to the patient based on review of the above and I have provided the patient with a written personalized care plan for preventive services.     Vicie Mutters, PA-C   04/23/2017

## 2017-04-23 ENCOUNTER — Encounter: Payer: Self-pay | Admitting: Physician Assistant

## 2017-04-23 ENCOUNTER — Ambulatory Visit (INDEPENDENT_AMBULATORY_CARE_PROVIDER_SITE_OTHER): Payer: Medicare Other | Admitting: Physician Assistant

## 2017-04-23 VITALS — BP 138/78 | HR 82 | Temp 98.4°F | Ht 62.0 in | Wt 110.0 lb

## 2017-04-23 DIAGNOSIS — E782 Mixed hyperlipidemia: Secondary | ICD-10-CM | POA: Diagnosis not present

## 2017-04-23 DIAGNOSIS — I10 Essential (primary) hypertension: Secondary | ICD-10-CM

## 2017-04-23 DIAGNOSIS — K624 Stenosis of anus and rectum: Secondary | ICD-10-CM

## 2017-04-23 DIAGNOSIS — E559 Vitamin D deficiency, unspecified: Secondary | ICD-10-CM

## 2017-04-23 DIAGNOSIS — K2100 Gastro-esophageal reflux disease with esophagitis, without bleeding: Secondary | ICD-10-CM

## 2017-04-23 DIAGNOSIS — M810 Age-related osteoporosis without current pathological fracture: Secondary | ICD-10-CM

## 2017-04-23 DIAGNOSIS — R350 Frequency of micturition: Secondary | ICD-10-CM

## 2017-04-23 DIAGNOSIS — R7309 Other abnormal glucose: Secondary | ICD-10-CM

## 2017-04-23 DIAGNOSIS — Z79899 Other long term (current) drug therapy: Secondary | ICD-10-CM | POA: Diagnosis not present

## 2017-04-23 DIAGNOSIS — Z0001 Encounter for general adult medical examination with abnormal findings: Secondary | ICD-10-CM

## 2017-04-23 DIAGNOSIS — G459 Transient cerebral ischemic attack, unspecified: Secondary | ICD-10-CM | POA: Diagnosis not present

## 2017-04-23 DIAGNOSIS — Z0000 Encounter for general adult medical examination without abnormal findings: Secondary | ICD-10-CM

## 2017-04-23 DIAGNOSIS — I739 Peripheral vascular disease, unspecified: Secondary | ICD-10-CM

## 2017-04-23 DIAGNOSIS — K589 Irritable bowel syndrome without diarrhea: Secondary | ICD-10-CM | POA: Diagnosis not present

## 2017-04-23 DIAGNOSIS — I872 Venous insufficiency (chronic) (peripheral): Secondary | ICD-10-CM | POA: Diagnosis not present

## 2017-04-23 DIAGNOSIS — R6889 Other general symptoms and signs: Secondary | ICD-10-CM

## 2017-04-23 DIAGNOSIS — I679 Cerebrovascular disease, unspecified: Secondary | ICD-10-CM

## 2017-04-23 DIAGNOSIS — C50311 Malignant neoplasm of lower-inner quadrant of right female breast: Secondary | ICD-10-CM | POA: Diagnosis not present

## 2017-04-23 LAB — CBC WITH DIFFERENTIAL/PLATELET
Basophils Absolute: 0 cells/uL (ref 0–200)
Basophils Relative: 0 %
Eosinophils Absolute: 172 cells/uL (ref 15–500)
Eosinophils Relative: 2 %
HCT: 37.4 % (ref 35.0–45.0)
Hemoglobin: 12.4 g/dL (ref 11.7–15.5)
LYMPHS PCT: 11 %
Lymphs Abs: 946 cells/uL (ref 850–3900)
MCH: 31.6 pg (ref 27.0–33.0)
MCHC: 33.2 g/dL (ref 32.0–36.0)
MCV: 95.2 fL (ref 80.0–100.0)
MONO ABS: 430 {cells}/uL (ref 200–950)
MONOS PCT: 5 %
MPV: 10.3 fL (ref 7.5–12.5)
NEUTROS PCT: 82 %
Neutro Abs: 7052 cells/uL (ref 1500–7800)
PLATELETS: 292 10*3/uL (ref 140–400)
RBC: 3.93 MIL/uL (ref 3.80–5.10)
RDW: 12.7 % (ref 11.0–15.0)
WBC: 8.6 10*3/uL (ref 3.8–10.8)

## 2017-04-23 LAB — LIPID PANEL
CHOLESTEROL: 151 mg/dL (ref <200)
HDL: 78 mg/dL (ref >50)
LDL Cholesterol: 60 mg/dL (ref <100)
Total CHOL/HDL Ratio: 1.9 Ratio (ref <5.0)
Triglycerides: 64 mg/dL (ref <150)
VLDL: 13 mg/dL (ref <30)

## 2017-04-23 LAB — HEPATIC FUNCTION PANEL
ALBUMIN: 4.6 g/dL (ref 3.6–5.1)
ALT: 15 U/L (ref 6–29)
AST: 20 U/L (ref 10–35)
Alkaline Phosphatase: 97 U/L (ref 33–130)
BILIRUBIN INDIRECT: 1.1 mg/dL (ref 0.2–1.2)
BILIRUBIN TOTAL: 1.3 mg/dL — AB (ref 0.2–1.2)
Bilirubin, Direct: 0.2 mg/dL (ref <=0.2)
TOTAL PROTEIN: 7.1 g/dL (ref 6.1–8.1)

## 2017-04-23 LAB — BASIC METABOLIC PANEL WITH GFR
BUN: 13 mg/dL (ref 7–25)
CALCIUM: 9.8 mg/dL (ref 8.6–10.4)
CO2: 27 mmol/L (ref 20–31)
Chloride: 106 mmol/L (ref 98–110)
Creat: 0.73 mg/dL (ref 0.60–0.93)
GFR, EST NON AFRICAN AMERICAN: 82 mL/min (ref >=60)
GLUCOSE: 89 mg/dL (ref 65–99)
Potassium: 4.1 mmol/L (ref 3.5–5.3)
Sodium: 144 mmol/L (ref 135–146)

## 2017-04-23 LAB — TSH: TSH: 1.35 mIU/L

## 2017-04-23 NOTE — Patient Instructions (Signed)

## 2017-04-24 LAB — MAGNESIUM: Magnesium: 2.1 mg/dL (ref 1.5–2.5)

## 2017-04-24 LAB — URINALYSIS, ROUTINE W REFLEX MICROSCOPIC
Bilirubin Urine: NEGATIVE
Glucose, UA: NEGATIVE
Hgb urine dipstick: NEGATIVE
Ketones, ur: NEGATIVE
LEUKOCYTES UA: NEGATIVE
NITRITE: NEGATIVE
PH: 5 (ref 5.0–8.0)
Protein, ur: NEGATIVE
SPECIFIC GRAVITY, URINE: 1.02 (ref 1.001–1.035)

## 2017-04-25 LAB — URINE CULTURE: Organism ID, Bacteria: NO GROWTH

## 2017-04-29 DIAGNOSIS — M9902 Segmental and somatic dysfunction of thoracic region: Secondary | ICD-10-CM | POA: Diagnosis not present

## 2017-04-29 DIAGNOSIS — M5033 Other cervical disc degeneration, cervicothoracic region: Secondary | ICD-10-CM | POA: Diagnosis not present

## 2017-05-01 DIAGNOSIS — M5033 Other cervical disc degeneration, cervicothoracic region: Secondary | ICD-10-CM | POA: Diagnosis not present

## 2017-05-01 DIAGNOSIS — M9902 Segmental and somatic dysfunction of thoracic region: Secondary | ICD-10-CM | POA: Diagnosis not present

## 2017-05-02 DIAGNOSIS — M9902 Segmental and somatic dysfunction of thoracic region: Secondary | ICD-10-CM | POA: Diagnosis not present

## 2017-05-02 DIAGNOSIS — M5033 Other cervical disc degeneration, cervicothoracic region: Secondary | ICD-10-CM | POA: Diagnosis not present

## 2017-05-06 DIAGNOSIS — M9902 Segmental and somatic dysfunction of thoracic region: Secondary | ICD-10-CM | POA: Diagnosis not present

## 2017-05-06 DIAGNOSIS — M5033 Other cervical disc degeneration, cervicothoracic region: Secondary | ICD-10-CM | POA: Diagnosis not present

## 2017-05-22 ENCOUNTER — Ambulatory Visit (INDEPENDENT_AMBULATORY_CARE_PROVIDER_SITE_OTHER): Payer: Medicare Other | Admitting: Family Medicine

## 2017-05-22 ENCOUNTER — Encounter: Payer: Self-pay | Admitting: Family Medicine

## 2017-05-22 VITALS — BP 131/77 | HR 68 | Temp 98.1°F | Ht 62.0 in | Wt 112.0 lb

## 2017-05-22 DIAGNOSIS — M7121 Synovial cyst of popliteal space [Baker], right knee: Secondary | ICD-10-CM | POA: Diagnosis not present

## 2017-05-22 DIAGNOSIS — M1711 Unilateral primary osteoarthritis, right knee: Secondary | ICD-10-CM | POA: Diagnosis not present

## 2017-05-22 MED ORDER — MELOXICAM 15 MG PO TABS: 15.0000 mg | ORAL_TABLET | Freq: Every day | ORAL | 0 refills | Status: DC

## 2017-05-22 NOTE — Progress Notes (Signed)
BP 131/77   Pulse 68   Temp 98.1 F (36.7 C) (Oral)   Ht 5\' 2"  (1.575 m)   Wt 112 lb (50.8 kg)   BMI 20.49 kg/m    Subjective:    Patient ID: Crystal [name], female    DOB: 04-Apr-1946, 71 y.o.   MRN: [id number]  HPI: Crystal [name] is a 71 y.o. female presenting on 05/22/2017 for Knot behind right knee (x 4 days, aching and tender on inside of knee, painful to ascend stairs, having chills)   HPI Right knee swelling and pain Patient comes in today complaining of right knee swelling and pain that has been bothering her over the past 4 days. She says is painful to ascend stairs especially on the medial aspect of the knee but also hurts on the lateral aspect as well. She denies any redness or warmth or giving way popping or catching but does occasionally feels some grinding there. She specially feels a soft swelling in the back of her knee that feels very tight and painful.  Relevant past medical, surgical, family and social history reviewed and updated as indicated. Interim medical history since our last visit reviewed. Allergies and medications reviewed and updated.  Review of Systems  Constitutional: Negative for chills and fever.  Eyes: Negative for visual disturbance.  Respiratory: Negative for chest tightness and shortness of breath.   Cardiovascular: Negative for chest pain and leg swelling.  Musculoskeletal: Positive for arthralgias and joint swelling. Negative for back pain and gait problem.  Skin: Negative for rash.  Psychiatric/Behavioral: Negative for agitation and behavioral problems.  All other systems reviewed and are negative.   Per HPI unless specifically indicated above        Objective:    BP 131/77   Pulse 68   Temp 98.1 F (36.7 C) (Oral)   Ht 5\' 2"  (1.575 m)   Wt 112 lb (50.8 kg)   BMI 20.49 kg/m   Wt Readings from Last 3 Encounters:  05/22/17 112 lb (50.8 kg)  04/23/17 110 lb (49.9 kg)  12/27/16 110 lb (49.9 kg)    Physical Exam    Constitutional: She is oriented to person, place, and time. She appears well-developed and well-nourished. No distress.  Eyes: Conjunctivae are normal.  Cardiovascular: Normal rate, regular rhythm, normal heart sounds and intact distal pulses.   No murmur heard. Pulmonary/Chest: Effort normal and breath sounds normal. No respiratory distress. She has no wheezes. She has no rales.  Musculoskeletal: Normal range of motion. She exhibits no edema.       Right knee: She exhibits effusion. She exhibits normal range of motion, no ecchymosis, no deformity, no erythema, normal alignment, no LCL laxity, normal patellar mobility, no bony tenderness, normal meniscus and no MCL laxity. Tenderness found. Medial joint line and lateral joint line tenderness noted.  Neurological: She is alert and oriented to person, place, and time. Coordination normal.  Skin: Skin is warm and dry. No rash noted. She is not diaphoretic.  Psychiatric: She has a normal mood and affect. Her behavior is normal.  Nursing note and vitals reviewed.       Assessment & Plan:   Problem List Items Addressed This Visit    None    Visit Diagnoses    Osteoarthritis of right knee, unspecified osteoarthritis type    -  Primary   Relevant Medications   meloxicam (MOBIC) 15 MG tablet   Baker's cyst of knee, right  Relevant Medications   meloxicam (MOBIC) 15 MG tablet     Instructed patient to ice and use anti-inflammatories, if not improving then come back for an injection of corticosteroids.   Follow up plan: Return if symptoms worsen or fail to improve.  Counseling provided for all of the vaccine components No orders of the defined types were placed in this encounter.   Caryl Pina, MD [facility] 05/22/2017, 5:37 PM

## 2017-06-19 ENCOUNTER — Other Ambulatory Visit: Payer: Self-pay | Admitting: Family Medicine

## 2017-06-19 DIAGNOSIS — M1711 Unilateral primary osteoarthritis, right knee: Secondary | ICD-10-CM

## 2017-06-19 DIAGNOSIS — M7121 Synovial cyst of popliteal space [Baker], right knee: Secondary | ICD-10-CM

## 2017-06-22 DIAGNOSIS — Z23 Encounter for immunization: Secondary | ICD-10-CM | POA: Diagnosis not present

## 2017-07-05 ENCOUNTER — Ambulatory Visit: Payer: Medicare Other | Admitting: Family Medicine

## 2017-07-10 ENCOUNTER — Encounter: Payer: Self-pay | Admitting: Family Medicine

## 2017-07-10 ENCOUNTER — Ambulatory Visit (INDEPENDENT_AMBULATORY_CARE_PROVIDER_SITE_OTHER): Payer: Medicare Other | Admitting: Family Medicine

## 2017-07-10 ENCOUNTER — Ambulatory Visit (INDEPENDENT_AMBULATORY_CARE_PROVIDER_SITE_OTHER): Payer: Medicare Other

## 2017-07-10 VITALS — BP 126/77 | HR 80 | Temp 97.7°F | Ht 62.0 in | Wt 112.0 lb

## 2017-07-10 DIAGNOSIS — M1711 Unilateral primary osteoarthritis, right knee: Secondary | ICD-10-CM

## 2017-07-10 NOTE — Progress Notes (Signed)
BP 126/77   Pulse 80   Temp 97.7 F (36.5 C) (Oral)   Ht 5\' 2"  (1.575 m)   Wt 112 lb (50.8 kg)   BMI 20.49 kg/m    Subjective:    Patient ID: Crystal [name], female    DOB: 1946/02/16, 71 y.o.   MRN: [id number]  HPI: Crystal [name] is a 71 y.o. female presenting on 07/10/2017 for Knee Pain (right knee, pain is no better, has worsened)   HPI  Right knee pain Patient has continued right knee pain that has been going on for some time now. She was seen about a week or 2 ago and prescribed meloxicam she did not get much better with it. She said helped a little bit but not significantly. She denies any fevers or chills or redness or warmth or we discussed the possibility of an injection because of the steroid will help reduce inflammation in the joint helpful as longer. She would like to do a shop this time which we discussed this time.  Relevant past medical, surgical, family and social history reviewed and updated as indicated. Interim medical history since our last visit reviewed. Allergies and medications reviewed and updated.  Review of Systems  Constitutional: Negative for chills and fever.  Eyes: Negative for visual disturbance.  Respiratory: Negative for chest tightness and shortness of breath.   Cardiovascular: Negative for chest pain and leg swelling.  Musculoskeletal: Positive for arthralgias. Negative for back pain, gait problem and joint swelling.  Skin: Negative for rash.  Neurological: Negative for light-headedness and headaches.  Psychiatric/Behavioral: Negative for agitation and behavioral problems.  All other systems reviewed and are negative.   Per HPI unless specifically indicated above     Objective:    BP 126/77   Pulse 80   Temp 97.7 F (36.5 C) (Oral)   Ht 5\' 2"  (1.575 m)   Wt 112 lb (50.8 kg)   BMI 20.49 kg/m   Wt Readings from Last 3 Encounters:  07/10/17 112 lb (50.8 kg)  05/22/17 112 lb (50.8 kg)  04/23/17 110 lb (49.9 kg)      Physical Exam  Constitutional: She is oriented to person, place, and time. She appears well-developed and well-nourished. No distress.  Eyes: Conjunctivae are normal.  Musculoskeletal: Normal range of motion.       Right knee: She exhibits normal range of motion, no swelling, normal alignment, no LCL laxity, normal patellar mobility, normal meniscus and no MCL laxity. Tenderness found. Medial joint line and lateral joint line tenderness noted.  Neurological: She is alert and oriented to person, place, and time. Coordination normal.  Skin: Skin is warm and dry. No rash noted. She is not diaphoretic.  Psychiatric: She has a normal mood and affect. Her behavior is normal.  Nursing note and vitals reviewed.   Knee injection: Consent form signed. Risk factors of bleeding and infection discussed with patient and patient is agreeable towards injection. Patient prepped with Betadine. Lateral approach towards injection used. Injected 80 mg of Depo-Medrol and 1 mL of 2% lidocaine. Patient tolerated procedure well and no side effects from noted. Minimal to no bleeding. Simple bandage applied after.     Assessment & Plan:   Problem List Items Addressed This Visit    None    Visit Diagnoses    Osteoarthritis of right knee, unspecified osteoarthritis type    -  Primary   Relevant Medications   methylPREDNISolone acetate (DEPO-MEDROL) injection 80 mg (Start on 07/13/2017 11:15  PM)   Other Relevant Orders   DG Knee 1-2 Views Right (Completed)       Follow up plan: Return if symptoms worsen or fail to improve.  Counseling provided for all of the vaccine components Orders Placed This Encounter  Procedures  . DG Knee 1-2 Views Right    Caryl Pina, MD [facility] 07/10/2017, 10:54 AM

## 2017-07-13 MED ORDER — METHYLPREDNISOLONE ACETATE 80 MG/ML IJ SUSP: 80.0000 mg | Freq: Once | INTRAMUSCULAR | Status: DC

## 2017-07-30 ENCOUNTER — Other Ambulatory Visit: Payer: Self-pay | Admitting: Physician Assistant

## 2017-07-30 DIAGNOSIS — Z9889 Other specified postprocedural states: Secondary | ICD-10-CM

## 2017-07-30 DIAGNOSIS — Z853 Personal history of malignant neoplasm of breast: Secondary | ICD-10-CM

## 2017-07-30 DIAGNOSIS — Z1231 Encounter for screening mammogram for malignant neoplasm of breast: Secondary | ICD-10-CM

## 2017-08-12 DIAGNOSIS — C50911 Malignant neoplasm of unspecified site of right female breast: Secondary | ICD-10-CM | POA: Diagnosis not present

## 2017-09-19 ENCOUNTER — Other Ambulatory Visit: Payer: Self-pay | Admitting: Internal Medicine

## 2017-10-02 ENCOUNTER — Other Ambulatory Visit: Payer: Self-pay | Admitting: Family Medicine

## 2017-10-02 DIAGNOSIS — I10 Essential (primary) hypertension: Secondary | ICD-10-CM

## 2017-11-01 ENCOUNTER — Ambulatory Visit: Payer: Self-pay | Admitting: Internal Medicine

## 2017-11-11 ENCOUNTER — Other Ambulatory Visit: Payer: Self-pay | Admitting: Physician Assistant

## 2017-11-11 DIAGNOSIS — Z853 Personal history of malignant neoplasm of breast: Secondary | ICD-10-CM

## 2017-11-11 DIAGNOSIS — Z9889 Other specified postprocedural states: Secondary | ICD-10-CM

## 2017-11-13 ENCOUNTER — Encounter: Payer: Self-pay | Admitting: Internal Medicine

## 2017-11-13 ENCOUNTER — Ambulatory Visit
Admission: RE | Admit: 2017-11-13 | Discharge: 2017-11-13 | Disposition: A | Discharge status: Home / Self Care | Payer: Medicare Other | Source: Ambulatory Visit | Attending: Physician Assistant | Admitting: Physician Assistant

## 2017-11-13 ENCOUNTER — Ambulatory Visit (INDEPENDENT_AMBULATORY_CARE_PROVIDER_SITE_OTHER): Payer: Medicare Other | Admitting: Internal Medicine

## 2017-11-13 VITALS — BP 126/62 | HR 76 | Temp 97.9°F | Resp 16 | Ht 62.0 in | Wt 112.2 lb

## 2017-11-13 DIAGNOSIS — Z853 Personal history of malignant neoplasm of breast: Secondary | ICD-10-CM

## 2017-11-13 DIAGNOSIS — E559 Vitamin D deficiency, unspecified: Secondary | ICD-10-CM | POA: Diagnosis not present

## 2017-11-13 DIAGNOSIS — I10 Essential (primary) hypertension: Secondary | ICD-10-CM | POA: Diagnosis not present

## 2017-11-13 DIAGNOSIS — R7303 Prediabetes: Secondary | ICD-10-CM | POA: Diagnosis not present

## 2017-11-13 DIAGNOSIS — R7309 Other abnormal glucose: Secondary | ICD-10-CM

## 2017-11-13 DIAGNOSIS — M8589 Other specified disorders of bone density and structure, multiple sites: Secondary | ICD-10-CM | POA: Diagnosis not present

## 2017-11-13 DIAGNOSIS — Z780 Asymptomatic menopausal state: Secondary | ICD-10-CM | POA: Diagnosis not present

## 2017-11-13 DIAGNOSIS — E782 Mixed hyperlipidemia: Secondary | ICD-10-CM

## 2017-11-13 DIAGNOSIS — M810 Age-related osteoporosis without current pathological fracture: Secondary | ICD-10-CM

## 2017-11-13 DIAGNOSIS — Z9889 Other specified postprocedural states: Secondary | ICD-10-CM

## 2017-11-13 DIAGNOSIS — Z79899 Other long term (current) drug therapy: Secondary | ICD-10-CM | POA: Diagnosis not present

## 2017-11-13 DIAGNOSIS — R922 Inconclusive mammogram: Secondary | ICD-10-CM | POA: Diagnosis not present

## 2017-11-13 HISTORY — DX: Personal history of irradiation: Z92.3

## 2017-11-13 NOTE — Patient Instructions (Signed)

## 2017-11-13 NOTE — Progress Notes (Signed)
This very nice 72 y.o.  WWFpresents for 3 month follow up with HTN, HLD, PreDM and Vit D Deficiency.  In 2013, she was treated for Stage IA Ca of R breast tx'd by lumpectomy and adjuvant RT  & has hx/o Intolerance to Letrozole.      Patient is treated for HTN (2004) since she presented then with a TIA & BP has been controlled since. Today's BP is at goal - 126/62. Patient has had no complaints of any cardiac type chest pain, palpitations, dyspnea / orthopnea / PND, dizziness, claudication, or dependent edema.     Hyperlipidemia is controlled with diet & meds. Patient denies myalgias or other med SE's. Last Lipids were at goal: Lab Results  Component Value Date   CHOL 151 04/23/2017   HDL 78 04/23/2017   LDLCALC 60 04/23/2017   TRIG 64 04/23/2017   CHOLHDL 1.9 04/23/2017      Also, the patient is monitored expectantly for abnormal glucose / PreDiabetes and has had no symptoms of reactive hypoglycemia, diabetic polys, paresthesias or visual blurring.  Last A1c was Normal & at goal: Lab Results  Component Value Date   HGBA1C 5.1 09/27/2016      Further, the patient also has history of Vitamin D Deficiency ("24"/2016) and supplements vitamin D without any suspected side-effects. Last vitamin D was at goal: Lab Results  Component Value Date   VD25OH 101 (H) 12/27/2016   Current Outpatient Medications on File Prior to Visit  Medication Sig  . amLODipine (NORVASC) 5 MG tablet TAKE 1 TABLET (5 MG TOTAL) BY MOUTH DAILY.  Marland Kitchen aspirin 81 MG tablet Take 81 mg by mouth daily.  Marland Kitchen atorvastatin (LIPITOR) 80 MG tablet TAKE 1/2 TABLET OR AS DIRECTED FOR CHOLESTEROL  . polyethylene glycol (MIRALAX / GLYCOLAX) packet Take 17 g by mouth 3 (three) times a week. Reported on 04/02/2016   No current facility-administered medications on file prior to visit.    No Known Allergies   PMHx:   Past Medical History:  Diagnosis Date  . Abdominal aortic ectasia ([place]) 01/31/2010   Qualifier: Diagnosis of  By:  [name] MD, [name]   . Adjustment disorder with depressed mood   . Anxiety   . Breast cancer ([place]) 03/11/12   right breast lumpectomy=invasive lobular ca,grade I/III,ER?PR=positive  . CEREBROVASCULAR DISEASE 06/16/2010   Qualifier: Diagnosis of  By: [name] MD, Hunt Oris   . CONSTIPATION 01/12/2009   Qualifier: Diagnosis of  By: [name] CMA ([place]), Mearl Latin    . Eosinophilic esophagitis   . ESOPHAGEAL STRICTURE 01/12/2009   Qualifier: Diagnosis of  By: [name] CMA ([place]), Mearl Latin    . EXTERNAL HEMORRHOIDS 01/12/2009   Qualifier: Diagnosis of  By: [name] CMA ([place]), Mearl Latin    . GERD 05/10/2007   Qualifier: Diagnosis of  By: [name] RMA, Lucy    . HYPERLIPIDEMIA 05/10/2007   Qualifier: Diagnosis of  By: [name], Lucy    . HYPERTENSION 05/10/2007   Qualifier: Diagnosis of  By: [name], Lucy    . IBS (irritable bowel syndrome)   . Incontinence   . Irritable bowel syndrome 01/12/2009   Qualifier: Diagnosis of  By: [name] CMA (AAMA), Mearl Latin    . Melanosis   . Orthostatic hypotension   . Overactive detrusor   . PERIPHERAL VASCULAR DISEASE 06/17/2010   Qualifier: Diagnosis of  By: [name] MD, Hunt Oris   . Radiation 04/21/2012-06/11/2012   64 gray right breast  . RECTAL FISSURE 02/16/2009  Qualifier: Diagnosis of  By: [name] CMA ([place]), Mearl Latin    . Rectal stenosis   . STENOSIS, RECTAL 02/16/2009   Qualifier: Diagnosis of  By: [name] CMA ([place]), Mearl Latin    . Tachyarrhythmia 09/03/2009   Qualifier: Diagnosis of  By: [name] MD, [place] ISCHEMIC ATTACK 03/02/2010   Qualifier: Diagnosis of  By: [name] MD, [name] VENOUS INSUFFICIENCY, LEGS 07/18/2010   Qualifier: Diagnosis of  By: [name] MD, [name]    Immunization History  Administered Date(s) Administered  . Influenza Split 08/22/2011, 06/30/2012, 08/01/2016  . Influenza Whole 08/13/2008, 08/03/2009, 07/18/2010  . Influenza,inj,Quad PF,6+ Mos 07/28/2013, 07/22/2015  . Influenza-Unspecified 09/21/2017  . Pneumococcal Conjugate-13  10/20/2013  . Pneumococcal Polysaccharide-23 08/13/2008  . Pneumococcal-Unspecified 11/12/2009  . Td 01/31/2010  . Tdap 08/22/2011   Past Surgical History:  Procedure Laterality Date  . ABDOMINAL HYSTERECTOMY    . BREAST LUMPECTOMY Right   . BREAST SURGERY Left    left breast x 2  . cardiac catherization    . CARDIAC CATHETERIZATION    . edg  04/21/2007  . ELECTROCARDIOGRAM  10/30/2006  . esophageal tumor excised; posterior 1986    . hemmorhoidectomy     x 3   FHx:    Reviewed / unchanged  SHx:    Reviewed / unchanged   Systems Review:  Constitutional: Denies fever, chills, wt changes, headaches, insomnia, fatigue, night sweats, change in appetite. Eyes: Denies redness, blurred vision, diplopia, discharge, itchy, watery eyes.  ENT: Denies discharge, congestion, post nasal drip, epistaxis, sore throat, earache, hearing loss, dental pain, tinnitus, vertigo, sinus pain, snoring.  CV: Denies chest pain, palpitations, irregular heartbeat, syncope, dyspnea, diaphoresis, orthopnea, PND, claudication or edema. Respiratory: denies cough, dyspnea, DOE, pleurisy, hoarseness, laryngitis, wheezing.  Gastrointestinal: Denies dysphagia, odynophagia, heartburn, reflux, water brash, abdominal pain or cramps, nausea, vomiting, bloating, diarrhea, constipation, hematemesis, melena, hematochezia  or hemorrhoids. Genitourinary: Denies dysuria, frequency, urgency, nocturia, hesitancy, discharge, hematuria or flank pain. Musculoskeletal: Denies arthralgias, myalgias, stiffness, jt. swelling, pain, limping or strain/sprain.  Skin: Denies pruritus, rash, hives, warts, acne, eczema or change in skin lesion(s). Neuro: No weakness, tremor, incoordination, spasms, paresthesia or pain. Psychiatric: Denies confusion, memory loss or sensory loss. Endo: Denies change in weight, skin or hair change.  Heme/Lymph: No excessive bleeding, bruising or enlarged lymph nodes.  Physical Exam  BP 126/62   Pulse 76    Temp 97.9 F (36.6 C)   Resp 16   Ht 5\' 2"  (1.575 m)   Wt 112 lb 3.2 oz (50.9 kg)   BMI 20.52 kg/m   Appears well nourished, well groomed  and in no distress.  Eyes: PERRLA, EOMs, conjunctiva no swelling or erythema. Sinuses: No frontal/maxillary tenderness ENT/Mouth: EAC's clear, TM's nl w/o erythema, bulging. Nares clear w/o erythema, swelling, exudates. Oropharynx clear without erythema or exudates. Oral hygiene is good. Tongue normal, non obstructing. Hearing intact.  Neck: Supple. Thyroid nl. Car 2+/2+ without bruits, nodes or JVD. Chest: Respirations nl with BS clear & equal w/o rales, rhonchi, wheezing or stridor.  Cor: Heart sounds normal w/ regular rate and rhythm without sig. murmurs, gallops, clicks or rubs. Peripheral pulses normal and equal  without edema.  Abdomen: Soft & bowel sounds normal. Non-tender w/o guarding, rebound, hernias, masses or organomegaly.  Lymphatics: Unremarkable.  Musculoskeletal: Full ROM all peripheral extremities, joint stability, 5/5 strength and normal gait.  Skin: Warm, dry without exposed rashes, lesions or ecchymosis apparent.  Neuro: Cranial nerves  intact, reflexes equal bilaterally. Sensory-motor testing grossly intact. Tendon reflexes grossly intact.  Pysch: Alert & oriented x 3.  Insight and judgement nl & appropriate. No ideations.  Assessment and Plan:  1. Essential hypertension  - Continue medication, monitor blood pressure at home.  - Continue DASH diet. Reminder to go to the ER if any CP,  SOB, nausea, dizziness, severe HA, changes vision/speech.  - CBC with Differential/Platelet - BASIC METABOLIC PANEL WITH GFR - Magnesium - TSH  2. Hyperlipidemia, mixed  - Continue diet/meds, exercise,& lifestyle modifications.  - Continue monitor periodic cholesterol/liver & renal functions   - Hepatic function panel - Lipid panel  3. Prediabetes  - Continue diet, exercise, lifestyle modifications.  - Monitor appropriate  labs.  - Hemoglobin A1c - Insulin, random  4. Vitamin D deficiency  - Continue supplementation.  - VITAMIN D 25 Hydroxy   5. Abnormal glucose  - Hemoglobin A1c - Insulin, random  6. Medication management  - CBC with Differential/Platelet - BASIC METABOLIC PANEL WITH GFR - Hepatic function panel - Magnesium - Lipid panel - TSH - Hemoglobin A1c - Insulin, random - VITAMIN D 25 Hydroxyl        Discussed  regular exercise, BP monitoring, weight control to achieve/maintain BMI less than 25 and discussed med and SE's. Recommended labs to assess and monitor clinical status with further disposition pending results of labs. Over 30 minutes of exam, counseling, chart review was performed.

## 2017-11-14 LAB — CBC WITH DIFFERENTIAL/PLATELET
BASOS PCT: 0.6 %
Basophils Absolute: 52 cells/uL (ref 0–200)
EOS ABS: 131 {cells}/uL (ref 15–500)
Eosinophils Relative: 1.5 %
HCT: 35.4 % (ref 35.0–45.0)
HEMOGLOBIN: 11.9 g/dL (ref 11.7–15.5)
Lymphs Abs: 853 cells/uL (ref 850–3900)
MCH: 31.1 pg (ref 27.0–33.0)
MCHC: 33.6 g/dL (ref 32.0–36.0)
MCV: 92.4 fL (ref 80.0–100.0)
MONOS PCT: 5.1 %
MPV: 11.1 fL (ref 7.5–12.5)
NEUTROS ABS: 7221 {cells}/uL (ref 1500–7800)
Neutrophils Relative %: 83 %
Platelets: 286 10*3/uL (ref 140–400)
RBC: 3.83 10*6/uL (ref 3.80–5.10)
RDW: 11.7 % (ref 11.0–15.0)
TOTAL LYMPHOCYTE: 9.8 %
WBC mixed population: 444 cells/uL (ref 200–950)
WBC: 8.7 10*3/uL (ref 3.8–10.8)

## 2017-11-14 LAB — BASIC METABOLIC PANEL WITH GFR
BUN: 14 mg/dL (ref 7–25)
CALCIUM: 9.9 mg/dL (ref 8.6–10.4)
CO2: 29 mmol/L (ref 20–32)
Chloride: 106 mmol/L (ref 98–110)
Creat: 0.71 mg/dL (ref 0.60–0.93)
GFR, EST NON AFRICAN AMERICAN: 84 mL/min/{1.73_m2} (ref >=60)
GFR, Est African American: 97 mL/min/{1.73_m2} (ref >=60)
GLUCOSE: 107 mg/dL — AB (ref 65–99)
POTASSIUM: 3.9 mmol/L (ref 3.5–5.3)
Sodium: 145 mmol/L (ref 135–146)

## 2017-11-14 LAB — HEPATIC FUNCTION PANEL
AG RATIO: 2 (calc) (ref 1.0–2.5)
ALBUMIN MSPROF: 4.4 g/dL (ref 3.6–5.1)
ALKALINE PHOSPHATASE (APISO): 87 U/L (ref 33–130)
ALT: 20 U/L (ref 6–29)
AST: 24 U/L (ref 10–35)
BILIRUBIN TOTAL: 1.4 mg/dL — AB (ref 0.2–1.2)
Bilirubin, Direct: 0.3 mg/dL — ABNORMAL HIGH (ref 0.0–0.2)
Globulin: 2.2 g/dL (calc) (ref 1.9–3.7)
Indirect Bilirubin: 1.1 mg/dL (calc) (ref 0.2–1.2)
TOTAL PROTEIN: 6.6 g/dL (ref 6.1–8.1)

## 2017-11-14 LAB — INSULIN, RANDOM: INSULIN: 9.5 u[IU]/mL (ref 2.0–19.6)

## 2017-11-14 LAB — HEMOGLOBIN A1C
EAG (MMOL/L): 5.7 (calc)
Hgb A1c MFr Bld: 5.2 % of total Hgb (ref <5.7)
MEAN PLASMA GLUCOSE: 103 (calc)

## 2017-11-14 LAB — LIPID PANEL
CHOLESTEROL: 155 mg/dL (ref <200)
HDL: 85 mg/dL (ref >50)
LDL Cholesterol (Calc): 55 mg/dL (calc)
Non-HDL Cholesterol (Calc): 70 mg/dL (calc) (ref <130)
TRIGLYCERIDES: 73 mg/dL (ref <150)
Total CHOL/HDL Ratio: 1.8 (calc) (ref <5.0)

## 2017-11-14 LAB — TSH: TSH: 1.27 mIU/L (ref 0.40–4.50)

## 2017-11-14 LAB — VITAMIN D 25 HYDROXY (VIT D DEFICIENCY, FRACTURES): VIT D 25 HYDROXY: 81 ng/mL (ref 30–100)

## 2017-11-14 LAB — MAGNESIUM: Magnesium: 2 mg/dL (ref 1.5–2.5)

## 2017-12-29 ENCOUNTER — Other Ambulatory Visit: Payer: Self-pay | Admitting: Family Medicine

## 2017-12-29 DIAGNOSIS — I10 Essential (primary) hypertension: Secondary | ICD-10-CM

## 2017-12-30 ENCOUNTER — Other Ambulatory Visit: Payer: Self-pay | Admitting: Family Medicine

## 2017-12-30 DIAGNOSIS — I10 Essential (primary) hypertension: Secondary | ICD-10-CM

## 2017-12-30 NOTE — Telephone Encounter (Signed)
Okay to go ahead and send a month refill but have her follow-up with [place] in the future for this.

## 2017-12-31 MED ORDER — AMLODIPINE BESYLATE 5 MG PO TABS: 5.0000 mg | ORAL_TABLET | Freq: Every day | ORAL | 0 refills | Status: DC

## 2017-12-31 NOTE — Telephone Encounter (Signed)
Medication sent in, patient aware via voicemail and also told she should call to make appt to see dr. Warrick Parisian

## 2018-01-27 ENCOUNTER — Encounter: Payer: Self-pay | Admitting: Family Medicine

## 2018-01-27 ENCOUNTER — Ambulatory Visit (INDEPENDENT_AMBULATORY_CARE_PROVIDER_SITE_OTHER): Payer: Medicare Other | Admitting: Family Medicine

## 2018-01-27 VITALS — BP 138/73 | HR 94 | Temp 97.3°F | Ht 62.0 in | Wt 110.4 lb

## 2018-01-27 DIAGNOSIS — N3001 Acute cystitis with hematuria: Secondary | ICD-10-CM

## 2018-01-27 DIAGNOSIS — R3129 Other microscopic hematuria: Secondary | ICD-10-CM | POA: Diagnosis not present

## 2018-01-27 DIAGNOSIS — R309 Painful micturition, unspecified: Secondary | ICD-10-CM | POA: Diagnosis not present

## 2018-01-27 LAB — MICROSCOPIC EXAMINATION
Bacteria, UA: NONE SEEN
RENAL EPITHEL UA: NONE SEEN /HPF

## 2018-01-27 LAB — URINALYSIS, COMPLETE
Bilirubin, UA: NEGATIVE
Glucose, UA: NEGATIVE
Ketones, UA: NEGATIVE
Leukocytes, UA: NEGATIVE
NITRITE UA: NEGATIVE
PH UA: 5.5 (ref 5.0–7.5)
PROTEIN UA: NEGATIVE
Specific Gravity, UA: <1.005 — ABNORMAL LOW (ref 1.005–1.030)
UUROB: 0.2 mg/dL (ref 0.2–1.0)

## 2018-01-27 MED ORDER — CEPHALEXIN 500 MG PO CAPS: 500.0000 mg | ORAL_CAPSULE | Freq: Four times a day (QID) | ORAL | 0 refills | Status: AC

## 2018-01-27 MED ORDER — PHENAZOPYRIDINE HCL 200 MG PO TABS: 200.0000 mg | ORAL_TABLET | Freq: Three times a day (TID) | ORAL | 0 refills | Status: DC | PRN

## 2018-01-27 NOTE — Progress Notes (Signed)
Subjective: CC: Urinary symptoms PCP: Unk Pinto, MD Crystal [name] is a 72 y.o. female presenting to clinic today for:  1. Urinary symptoms Patient reports a 3-day history of dysuria, urinary frequency.  She denies abdominal pain, nausea, vomiting, fevers, abnormal vaginal discharge or bleeding.  No hematuria.  She does report associated chills and right-sided low back pain that onset with urinary symptoms.  Denies history of recurrent urinary tract infections or kidney stones.  She believes that symptoms are likely related to increased caffeine use.  ROS: Per HPI  No Known Allergies Past Medical History:  Diagnosis Date  . Abdominal aortic ectasia ([place]) 01/31/2010   Qualifier: Diagnosis of  By: [name] MD, [name]   . Adjustment disorder with depressed mood   . Anxiety   . Breast cancer ([place]) 03/11/12   right breast lumpectomy=invasive lobular ca,grade I/III,ER?PR=positive  . CEREBROVASCULAR DISEASE 06/16/2010   Qualifier: Diagnosis of  By: [name] MD, Hunt Oris   . CONSTIPATION 01/12/2009   Qualifier: Diagnosis of  By: [name] CMA ([place]), Mearl Latin    . Eosinophilic esophagitis   . ESOPHAGEAL STRICTURE 01/12/2009   Qualifier: Diagnosis of  By: [name] CMA ([place]), Mearl Latin    . EXTERNAL HEMORRHOIDS 01/12/2009   Qualifier: Diagnosis of  By: [name] CMA ([place]), Mearl Latin    . GERD 05/10/2007   Qualifier: Diagnosis of  By: [name] RMA, [place]    . HYPERLIPIDEMIA 05/10/2007   Qualifier: Diagnosis of  By: [name], Lucy    . HYPERTENSION 05/10/2007   Qualifier: Diagnosis of  By: [name], [place]    . IBS (irritable bowel syndrome)   . Incontinence   . Irritable bowel syndrome 01/12/2009   Qualifier: Diagnosis of  By: [name] CMA (AAMA), Mearl Latin    . Melanosis   . Orthostatic hypotension   . Overactive detrusor   . PERIPHERAL VASCULAR DISEASE 06/17/2010   Qualifier: Diagnosis of  By: [name] MD, Hunt Oris   . Personal history of radiation therapy 2013  . Radiation 04/21/2012-06/11/2012   64 gray  right breast  . RECTAL FISSURE 02/16/2009   Qualifier: Diagnosis of  By: [name] CMA ([place]), Mearl Latin    . Rectal stenosis   . STENOSIS, RECTAL 02/16/2009   Qualifier: Diagnosis of  By: [name] CMA ([place]), Mearl Latin    . Tachyarrhythmia 09/03/2009   Qualifier: Diagnosis of  By: [name] MD, [place] ATTACK 03/02/2010   Qualifier: Diagnosis of  By: [name] MD, [name] VENOUS INSUFFICIENCY, LEGS 07/18/2010   Qualifier: Diagnosis of  By: [name] MD, [name]     Current Outpatient Medications:  .  amLODipine (NORVASC) 5 MG tablet, Take 1 tablet (5 mg total) by mouth daily. Pt. Needs to be seen for further refills, Disp: 90 tablet, Rfl: 0 .  aspirin 81 MG tablet, Take 81 mg by mouth daily., Disp: , Rfl:  .  atorvastatin (LIPITOR) 80 MG tablet, TAKE 1/2 TABLET OR AS DIRECTED FOR CHOLESTEROL, Disp: 30 tablet, Rfl: 5 .  polyethylene glycol (MIRALAX / GLYCOLAX) packet, Take 17 g by mouth 3 (three) times a week. Reported on 04/02/2016, Disp: , Rfl:  Social History   Socioeconomic History  . Marital status: Widowed    Spouse name: Not on file  . Number of children: 1  . Years of education: Not on file  . Highest education level: Not on file  Occupational History  . Occupation: retired  Scientific laboratory technician  . Financial resource strain: Not  on file  . Food insecurity:    Worry: Not on file    Inability: Not on file  . Transportation needs:    Medical: Not on file    Non-medical: Not on file  Tobacco Use  . Smoking status: Former Smoker    Types: Cigarettes    Last attempt to quit: 10/22/1965    Years since quitting: 52.3  . Smokeless tobacco: Never Used  Substance and Sexual Activity  . Alcohol use: Yes    Alcohol/week: 1.8 oz    Types: 3 Glasses of wine per week    Comment: occasional   . Drug use: No  . Sexual activity: Not on file  Lifestyle  . Physical activity:    Days per week: Not on file    Minutes per session: Not on file  . Stress: Not on file  Relationships   . Social connections:    Talks on phone: Not on file    Gets together: Not on file    Attends religious service: Not on file    Active member of club or organization: Not on file    Attends meetings of clubs or organizations: Not on file    Relationship status: Not on file  . Intimate partner violence:    Fear of current or ex partner: Not on file    Emotionally abused: Not on file    Physically abused: Not on file    Forced sexual activity: Not on file  Other Topics Concern  . Not on file  Social History Narrative   HSG   Married '71-  Widowed '08   1 son - '76; no grandchildren   Retired, Lives alone. I-ADL's   End of Life: No resuscitation, no mechanical ventilation, no futile heroic measures.   Patient is a former smoker, quit 40 years ago.   Alcohol use-yes occ wine   Daily caffeine use 2 cups coffee / day   Illicit drug use-no   Patient does not get regular exercise.   Family History  Problem Relation Age of Onset  . Bone cancer Father   . Heart disease Brother        MI  . Colon cancer Neg Hx   . Breast cancer Neg Hx   . Diabetes Neg Hx   . Anesthesia problems Neg Hx     Objective: Office vital signs reviewed. BP 138/73   Pulse 94   Temp (!) 97.3 F (36.3 C) (Oral)   Ht 5\' 2"  (1.575 m)   Wt 110 lb 6.4 oz (50.1 kg)   BMI 20.19 kg/m   Physical Examination:  General: Awake, alert, well nourished, well appearing female, No acute distress GU: + Suprapubic tenderness to palpation MSK: No CVA tenderness to palpation.  She does have mild tenderness to palpation over the lumbosacral junction on the right.  Assessment/ Plan: 72 y.o. female   1. Acute cystitis with hematuria Clinically consistent with a urinary tract infection.  She has 1+ blood on urinalysis and 3-10 red blood cells on urine microscopy.  No bacteria seen but I do question whether or not this is dilutional given her reports of significantly increased water consumption.  Will send for urine  culture.  In the meantime, I have prescribed her Keflex 500 mg 4 times daily for the next 7 days to cover for any possible progression into the kidney.  Pyridium also prescribed for dysuria.  I instructed her to not use this longer than the next 2 days  so as to not mask any persistent symptoms.  Patient voiced good understanding of instructions.  Will contact patient with urine culture results once they are available. - Urinalysis, Complete - Urine Culture  2. Other microscopic hematuria    Orders Placed This Encounter  Procedures  . Urine Culture  . Urinalysis, Complete   Meds ordered this encounter  Medications  . cephALEXin (KEFLEX) 500 MG capsule    Sig: Take 1 capsule (500 mg total) by mouth 4 (four) times daily for 7 days.    Dispense:  28 capsule    Refill:  0  . phenazopyridine (PYRIDIUM) 200 MG tablet    Sig: Take 1 tablet (200 mg total) by mouth 3 (three) times daily as needed for pain.    Dispense:  6 tablet    Refill:  0     Ashly Windell Moulding, DO [place] [phone]

## 2018-01-27 NOTE — Patient Instructions (Signed)
Your urine did not demonstrate any bacteria or byproducts of bacteria on your urine sample today.  However, blood was noted.  The absence of bacteria on today's urine sample may be a result of your very good hydration and dilution of the sample.  I have sent this for culture to further evaluate.  In the interim, I have placed you on an oral antibiotic to take 4 times a day for the next week and given you a medication to help with the burning sensation you are experiencing during urination.  I will contact you with the results of the urine culture once this is available, which will likely be later in the week.   Urinary Tract Infection, Adult A urinary tract infection (UTI) is an infection of any part of the urinary tract. The urinary tract includes the:  Kidneys.  Ureters.  Bladder.  Urethra.  These organs make, store, and get rid of pee (urine) in the body. Follow these instructions at home:  Take over-the-counter and prescription medicines only as told by your doctor.  If you were prescribed an antibiotic medicine, take it as told by your doctor. Do not stop taking the antibiotic even if you start to feel better.  Avoid the following drinks: ? Alcohol. ? Caffeine. ? Tea. ? Carbonated drinks.  Drink enough fluid to keep your pee clear or pale yellow.  Keep all follow-up visits as told by your doctor. This is important.  Make sure to: ? Empty your bladder often and completely. Do not to hold pee for long periods of time. ? Empty your bladder before and after sex. ? Wipe from front to back after a bowel movement if you are female. Use each tissue one time when you wipe. Contact a doctor if:  You have back pain.  You have a fever.  You feel sick to your stomach (nauseous).  You throw up (vomit).  Your symptoms do not get better after 3 days.  Your symptoms go away and then come back. Get help right away if:  You have very bad back pain.  You have very bad lower belly  (abdominal) pain.  You are throwing up and cannot keep down any medicines or water. This information is not intended to replace advice given to you by your health care provider. Make sure you discuss any questions you have with your health care provider. Document Released: 03/26/2008 Document Revised: 03/15/2016 Document Reviewed: 08/29/2015 Elsevier Interactive Patient Education  [employer].

## 2018-01-28 LAB — URINE CULTURE

## 2018-01-31 DIAGNOSIS — H1045 Other chronic allergic conjunctivitis: Secondary | ICD-10-CM | POA: Diagnosis not present

## 2018-01-31 DIAGNOSIS — H5203 Hypermetropia, bilateral: Secondary | ICD-10-CM | POA: Diagnosis not present

## 2018-01-31 DIAGNOSIS — H52223 Regular astigmatism, bilateral: Secondary | ICD-10-CM | POA: Diagnosis not present

## 2018-01-31 DIAGNOSIS — Z961 Presence of intraocular lens: Secondary | ICD-10-CM | POA: Diagnosis not present

## 2018-02-20 ENCOUNTER — Ambulatory Visit: Payer: Self-pay | Admitting: Adult Health

## 2018-03-23 ENCOUNTER — Other Ambulatory Visit: Payer: Self-pay | Admitting: Family Medicine

## 2018-03-23 DIAGNOSIS — I10 Essential (primary) hypertension: Secondary | ICD-10-CM

## 2018-03-24 ENCOUNTER — Ambulatory Visit (INDEPENDENT_AMBULATORY_CARE_PROVIDER_SITE_OTHER): Payer: Medicare Other | Admitting: Family Medicine

## 2018-03-24 ENCOUNTER — Encounter: Payer: Self-pay | Admitting: Family Medicine

## 2018-03-24 VITALS — BP 137/75 | HR 86 | Temp 97.7°F | Ht 62.0 in | Wt 110.0 lb

## 2018-03-24 DIAGNOSIS — I739 Peripheral vascular disease, unspecified: Secondary | ICD-10-CM | POA: Diagnosis not present

## 2018-03-24 DIAGNOSIS — E559 Vitamin D deficiency, unspecified: Secondary | ICD-10-CM

## 2018-03-24 DIAGNOSIS — K2100 Gastro-esophageal reflux disease with esophagitis, without bleeding: Secondary | ICD-10-CM

## 2018-03-24 DIAGNOSIS — E782 Mixed hyperlipidemia: Secondary | ICD-10-CM | POA: Diagnosis not present

## 2018-03-24 DIAGNOSIS — I10 Essential (primary) hypertension: Secondary | ICD-10-CM

## 2018-03-24 MED ORDER — ATORVASTATIN CALCIUM 80 MG PO TABS: ORAL_TABLET | ORAL | 1 refills | Status: DC

## 2018-03-24 MED ORDER — AMLODIPINE BESYLATE 5 MG PO TABS: 5.0000 mg | ORAL_TABLET | Freq: Every day | ORAL | 1 refills | Status: DC

## 2018-03-24 NOTE — Progress Notes (Signed)
BP 137/75   Pulse 86   Temp 97.7 F (36.5 C) (Oral)   Ht 5\' 2"  (1.575 m)   Wt 110 lb (49.9 kg)   BMI 20.12 kg/m    Subjective:    Patient ID: Crystal [name], female    DOB: 1946/10/16, 72 y.o.   MRN: [id number]  HPI: Crystal [name] is a 72 y.o. female presenting on 03/24/2018 for Hyperlipidemia and Hypertension   HPI Hypertension Patient is currently on amlodipine, and their blood pressure today is 137/75. Patient denies any lightheadedness or dizziness. Patient denies headaches, blurred vision, chest pains, shortness of breath, or weakness. Denies any side effects from medication and is content with current medication.   GERD Patient is currently on no medication currently for GERD.  She denies any major symptoms or abdominal pain or belching or burping. She denies any blood in her stool or lightheadedness or dizziness.   Hyperlipidemia Patient is coming in for recheck of his hyperlipidemia. The patient is currently taking atorvastatin. They deny any issues with myalgias or history of liver damage from it. They deny any focal numbness or weakness or chest pain.   Patient has known peripheral vascular disease and is on a statin  Patient has a history of vitamin D deficiency and is taking supplementation.  Patient says she is getting some sinus congestion and allergies, she has been using Mucinex, recommended that she add Flonase  Relevant past medical, surgical, family and social history reviewed and updated as indicated. Interim medical history since our last visit reviewed. Allergies and medications reviewed and updated.  Review of Systems  Constitutional: Negative for chills and fever.  HENT: Positive for congestion. Negative for ear discharge and ear pain.   Eyes: Negative for redness and visual disturbance.  Respiratory: Negative for chest tightness and shortness of breath.   Cardiovascular: Negative for chest pain and leg swelling.  Genitourinary: Negative for  difficulty urinating and dysuria.  Musculoskeletal: Negative for back pain and gait problem.  Skin: Negative for rash.  Neurological: Negative for light-headedness and headaches.  Psychiatric/Behavioral: Negative for agitation and behavioral problems.  All other systems reviewed and are negative.   Per HPI unless specifically indicated above   Allergies as of 03/24/2018   No Known Allergies     Medication List        Accurate as of 03/24/18  8:53 AM. Always use your most recent med list.          amLODipine 5 MG tablet Commonly known as:  NORVASC Take 1 tablet (5 mg total) by mouth daily. Pt. Needs to be seen for further refills   aspirin 81 MG tablet Take 81 mg by mouth daily.   atorvastatin 80 MG tablet Commonly known as:  LIPITOR TAKE 1/2 TABLET OR AS DIRECTED FOR CHOLESTEROL   polyethylene glycol packet Commonly known as:  MIRALAX / GLYCOLAX Take 17 g by mouth 3 (three) times a week. Reported on 04/02/2016          Objective:    BP 137/75   Pulse 86   Temp 97.7 F (36.5 C) (Oral)   Ht 5\' 2"  (1.575 m)   Wt 110 lb (49.9 kg)   BMI 20.12 kg/m   Wt Readings from Last 3 Encounters:  03/24/18 110 lb (49.9 kg)  01/27/18 110 lb 6.4 oz (50.1 kg)  11/13/17 112 lb 3.2 oz (50.9 kg)    Physical Exam  Constitutional: She is oriented to person, place, and time. She  appears well-developed and well-nourished. No distress.  Eyes: Conjunctivae are normal.  Cardiovascular: Normal rate, regular rhythm, normal heart sounds and intact distal pulses.  No murmur heard. Pulmonary/Chest: Effort normal and breath sounds normal. No respiratory distress. She has no wheezes.  Musculoskeletal: Normal range of motion.  Neurological: She is alert and oriented to person, place, and time. Coordination normal.  Skin: Skin is warm and dry. No rash noted. She is not diaphoretic.  Psychiatric: She has a normal mood and affect. Her behavior is normal.  Nursing note and vitals reviewed.       Assessment & Plan:   Problem List Items Addressed This Visit      Cardiovascular and Mediastinum   Essential hypertension   Relevant Medications   atorvastatin (LIPITOR) 80 MG tablet   amLODipine (NORVASC) 5 MG tablet   Peripheral vascular disease (HCC)   Relevant Medications   atorvastatin (LIPITOR) 80 MG tablet   amLODipine (NORVASC) 5 MG tablet     Digestive   GERD     Other   Hyperlipidemia - Primary   Relevant Medications   atorvastatin (LIPITOR) 80 MG tablet   amLODipine (NORVASC) 5 MG tablet   Vitamin D deficiency      Follow up plan: Return in about 6 months (around 09/23/2018), or if symptoms worsen or fail to improve, for Hyperlipidemia and hypertension recheck.  Counseling provided for all of the vaccine components No orders of the defined types were placed in this encounter.   Caryl Pina, MD [facility] 03/24/2018, 8:53 AM

## 2018-04-17 ENCOUNTER — Ambulatory Visit (INDEPENDENT_AMBULATORY_CARE_PROVIDER_SITE_OTHER): Payer: Medicare Other

## 2018-04-17 ENCOUNTER — Encounter: Payer: Self-pay | Admitting: Pediatrics

## 2018-04-17 ENCOUNTER — Ambulatory Visit (INDEPENDENT_AMBULATORY_CARE_PROVIDER_SITE_OTHER): Payer: Medicare Other | Admitting: Pediatrics

## 2018-04-17 VITALS — BP 131/72 | HR 77 | Temp 97.7°F | Ht 62.0 in | Wt 110.6 lb

## 2018-04-17 DIAGNOSIS — W19XXXA Unspecified fall, initial encounter: Secondary | ICD-10-CM

## 2018-04-17 DIAGNOSIS — S3992XA Unspecified injury of lower back, initial encounter: Secondary | ICD-10-CM | POA: Diagnosis not present

## 2018-04-17 DIAGNOSIS — M533 Sacrococcygeal disorders, not elsewhere classified: Secondary | ICD-10-CM | POA: Diagnosis not present

## 2018-04-17 DIAGNOSIS — M8580 Other specified disorders of bone density and structure, unspecified site: Secondary | ICD-10-CM

## 2018-04-17 DIAGNOSIS — M545 Low back pain: Secondary | ICD-10-CM

## 2018-04-17 DIAGNOSIS — M25531 Pain in right wrist: Secondary | ICD-10-CM | POA: Diagnosis not present

## 2018-04-17 DIAGNOSIS — S6991XA Unspecified injury of right wrist, hand and finger(s), initial encounter: Secondary | ICD-10-CM | POA: Diagnosis not present

## 2018-04-17 NOTE — Patient Instructions (Addendum)
Take 800 IU of vitamin D every day and 1200 mg of calcium to help keep your bones strong. Try to get 20-30 minutes of weight bearing exercise (walking is fine) on average a day.

## 2018-04-17 NOTE — Progress Notes (Signed)
  Subjective:   Patient ID: Crystal [name], female    DOB: 11-11-1945, 72 y.o.   MRN: [id number] CC: Fall (Yesterday last night); Arm Pain (Right); Tailbone Pain; and Hand Pain (Right)  HPI: Crystal [name] is a 72 y.o. female   Fall yesterday when she turned too fast after locking her garage door.  Her right arm fell into the garden hose, hit her right wrist, feet slipped out from under her and she sat down hard on the step.  She did not hit her head.  She did not lose consciousness.  She says she was hurrying to meet friends and was moving too fast.  She has not had any other recent falls.  Feels sore today, pain in her tailbone with sitting, feels tight with walking.  Right wrist tender and with abrasion flexor surface ulnar side.  Has not taken anything for the pain at home.  She does take a baby aspirin every day.  Last DEXA scan 10/2017.  Showed osteopenia.  She is to walk regularly, is not been doing it regularly now.  She is not taking calcium or vitamin D.  Relevant past medical, surgical, family and social history reviewed. Allergies and medications reviewed and updated. Social History   Tobacco Use  Smoking Status Former Smoker  . Types: Cigarettes  . Last attempt to quit: 10/22/1965  . Years since quitting: 52.5  Smokeless Tobacco Never Used   ROS: Per HPI   Objective:    BP 131/72   Pulse 77   Temp 97.7 F (36.5 C) (Oral)   Ht 5\' 2"  (1.575 m)   Wt 110 lb 9.6 oz (50.2 kg)   BMI 20.23 kg/m   Wt Readings from Last 3 Encounters:  04/17/18 110 lb 9.6 oz (50.2 kg)  03/24/18 110 lb (49.9 kg)  01/27/18 110 lb 6.4 oz (50.1 kg)    Gen: NAD, alert, cooperative with exam, NCAT EYES: EOMI, no conjunctival injection, or no icterus ENT:  TMs pearly gray b/l, OP without erythema LYMPH: no cervical LAD CV: NRRR, normal S1/S2, no murmur, distal pulses 2+ b/l Resp: CTABL, no wheezes, normal WOB Ext: No edema, warm Neuro: Alert and oriented MSK: tender to palpation over  tailbone.  No other point tenderness over spine.  Point tenderness flexor surface right wrist ulnar side. Skin: Approximately 10 cm Bruise over right upper arm, right lower leg with 4 cm bruise.  Preliminary read by Crystal Found, MD:  No acute fracture seen wrist x-ray  Assessment & Plan:  Crystal [name] was seen today for fall, arm pain, tailbone pain and hand pain.  Diagnoses and all orders for this visit:  Fall, initial encounter Discussed symptom care, rest, Tylenol, 200 to 400 mg of ibuprofen 2-3 times a day as needed for the next 5 days.  Any worsening symptoms let us know.  We will follow-up final read of x-rays.  Discussed fall prevention.  -     DG Wrist Complete Right; Future -     DG Sacrum/Coccyx; Future  Osteopenia, unspecified location Encouraged regular weightbearing exercise, taking vitamin D and calcium.  Follow up plan: Return if symptoms worsen or fail to improve. Crystal Found, MD [place]

## 2018-04-23 ENCOUNTER — Encounter: Payer: Self-pay | Admitting: Physician Assistant

## 2018-05-22 ENCOUNTER — Encounter: Payer: Self-pay | Admitting: *Deleted

## 2018-05-22 ENCOUNTER — Ambulatory Visit (INDEPENDENT_AMBULATORY_CARE_PROVIDER_SITE_OTHER): Payer: Medicare Other | Admitting: *Deleted

## 2018-05-22 VITALS — BP 140/74 | HR 86 | Ht 61.25 in | Wt 109.0 lb

## 2018-05-22 DIAGNOSIS — Z0000 Encounter for general adult medical examination without abnormal findings: Secondary | ICD-10-CM

## 2018-05-22 NOTE — Patient Instructions (Signed)
  Crystal [name] , Thank you for taking time to come for your Medicare Wellness Visit. I appreciate your ongoing commitment to your health goals. Please review the following plan we discussed and let me know if I can assist you in the future.   These are the goals we discussed: Goals    . Exercise 150 min/wk Moderate Activity       This is a list of the screening recommended for you and due dates:  Health Maintenance  Topic Date Due  . Flu Shot  05/22/2018  . Colon Cancer Screening  01/18/2019  . Mammogram  11/14/2019  . Tetanus Vaccine  06/04/2023  . DEXA scan (bone density measurement)  Completed  .  Hepatitis C: One time screening is recommended by Center for Disease Control  (CDC) for  adults born from [birth year] through 1967.   Completed  . Pneumonia vaccines  Completed

## 2018-05-22 NOTE — Progress Notes (Signed)
Subjective:   Crystal [name] is a 72 y.o. female who presents for a Medicare Annual Wellness Visit. Crystal [name] lives at home alone with her cat.  She has one adult son. Her husband passed away in 01/18/2007 when the small airplane he was piloting crashed. 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 She is active and visits with friends often. She gets together with a group for breakfast each morning and she attends church regularly.    Review of Systems    Patient reports that her overall health is unchanged compared to last year.  Cardiac Risk Factors include: advanced age (>80men, >19 women);dyslipidemia;hypertension   All other systems negative       Current Medications (verified) Outpatient Encounter Medications as of 05/22/2018  Medication Sig  . amLODipine (NORVASC) 5 MG tablet Take 1 tablet (5 mg total) by mouth daily. Pt. Needs to be seen for further refills  . aspirin 81 MG tablet Take 81 mg by mouth daily.  Marland Kitchen atorvastatin (LIPITOR) 80 MG tablet TAKE 1/2 TABLET OR AS DIRECTED FOR CHOLESTEROL  . Cholecalciferol (VITAMIN D3) 3000 units TABS Take 1 tablet by mouth.  . polyethylene glycol (MIRALAX / GLYCOLAX) packet Take 17 g by mouth 3 (three) times a week. Reported on 04/02/2016   No facility-administered encounter medications on file as of 05/22/2018.     Allergies (verified) Patient has no known allergies.   History: Past Medical History:  Diagnosis Date  . Abdominal aortic ectasia ([place]) 01/31/2010   Qualifier: Diagnosis of  By: [name] MD, [name]   . Adjustment disorder with depressed mood   . Anxiety   . Breast cancer ([place]) 03/11/12   right breast lumpectomy=invasive lobular ca,grade I/III,ER?PR=positive  . CEREBROVASCULAR DISEASE 06/16/2010   Qualifier: Diagnosis of  By: [name] MD, Hunt Oris   . CONSTIPATION 01/12/2009   Qualifier: Diagnosis of  By: [name] CMA ([place]), Mearl Latin    . Eosinophilic esophagitis   . ESOPHAGEAL  STRICTURE 01/12/2009   Qualifier: Diagnosis of  By: [name] CMA ([place]), Mearl Latin    . EXTERNAL HEMORRHOIDS 01/12/2009   Qualifier: Diagnosis of  By: [name] CMA ([place]), Mearl Latin    . GERD 05/10/2007   Qualifier: Diagnosis of  By: [name] RMA, Lucy    . HYPERLIPIDEMIA 05/10/2007   Qualifier: Diagnosis of  By: [name], Lucy    . HYPERTENSION 05/10/2007   Qualifier: Diagnosis of  By: [name], Lucy    . IBS (irritable bowel syndrome)   . Incontinence   . Irritable bowel syndrome 01/12/2009   Qualifier: Diagnosis of  By: [name] CMA ([facility]AMA), Mearl Latin    . Melanosis   . Orthostatic hypotension   . Overactive detrusor   . PERIPHERAL VASCULAR DISEASE 06/17/2010   Qualifier: Diagnosis of  By: [name] MD, Hunt Oris   . Personal history of radiation therapy 2012-01-18  . Radiation 04/21/2012-06/11/2012   64 gray right breast  . RECTAL FISSURE 02/16/2009   Qualifier: Diagnosis of  By: [name] CMA ([place]), Mearl Latin    . Rectal stenosis   . STENOSIS, RECTAL 02/16/2009   Qualifier: Diagnosis of  By: [name] CMA ([place]), Mearl Latin    . Tachyarrhythmia 09/03/2009   Qualifier: Diagnosis of  By: [name] MD, [place] ISCHEMIC ATTACK 03/02/2010   Qualifier: Diagnosis of  By: [name] MD, [name] VENOUS INSUFFICIENCY, LEGS 07/18/2010   Qualifier: Diagnosis of  By: [name] MD, [name]  Past Surgical History:  Procedure Laterality Date  . ABDOMINAL HYSTERECTOMY    . BREAST BIOPSY Right 2014  . BREAST BIOPSY Right 2013   malignant  . BREAST EXCISIONAL BIOPSY Left 1972  . BREAST LUMPECTOMY Right 2013  . BREAST SURGERY Left    left breast x 2  . cardiac catherization    . CARDIAC CATHETERIZATION    . edg  04/21/2007  . ELECTROCARDIOGRAM  10/30/2006  . esophageal tumor excised; posterior 1986    . hemmorhoidectomy     x 3   Family History  Problem Relation Age of Onset  . Bone cancer Father   . Heart disease Brother        MI  . Aneurysm Brother   . Colon cancer Neg Hx   . Breast cancer Neg Hx   . Diabetes  Neg Hx   . Anesthesia problems Neg Hx    Social History   Socioeconomic History  . Marital status: Widowed    Spouse name: Not on file  . Number of children: 1  . Years of education: 31  . Highest education level: High school graduate  Occupational History  . Occupation: retired    Comment: Network engineer  Social Needs  . Financial resource strain: Not hard at all  . Food insecurity:    Worry: Never true    Inability: Never true  . Transportation needs:    Medical: No    Non-medical: No  Tobacco Use  . Smoking status: Former Smoker    Types: Cigarettes    Last attempt to quit: 10/22/1965    Years since quitting: 52.6  . Smokeless tobacco: Never Used  Substance and Sexual Activity  . Alcohol use: Yes    Alcohol/week: 1.8 oz    Types: 3 Glasses of wine per week    Comment: occasional   . Drug use: No  . Sexual activity: Not Currently  Lifestyle  . Physical activity:    Days per week: 0 days    Minutes per session: 0 min  . Stress: Not at all  Relationships  . Social connections:    Talks on phone: More than three times a week    Gets together: More than three times a week    Attends religious service: More than 4 times per year    Active member of club or organization: Yes    Attends meetings of clubs or organizations: More than 4 times per year    Relationship status: Widowed  Other Topics Concern  . Not on file  Social History Narrative   HSG   Married '71-  Widowed '08   1 son - '76; no grandchildren   Retired, Lives alone. I-ADL's   End of Life: No resuscitation, no mechanical ventilation, no futile heroic measures.   Patient is a former smoker, quit 40 years ago.   Alcohol use-yes occ wine   Daily caffeine use 2 cups coffee / day   Illicit drug use-no   Patient does not get regular exercise.    Tobacco Use No.  Clinical Intake:  Pre-visit preparation completed: No  Pain : No/denies pain     Nutritional Status: BMI of 19-24  Normal Diabetes:  No  How often do you need to have someone help you when you read instructions, pamphlets, or other written materials from your doctor or pharmacy?: 1 - Never What is the last grade level you completed in school?: graduated high school     Information entered by ::  Chong Sicilian, RN   Activities of Daily Living In your present state of health, do you have any difficulty performing the following activities: 05/22/2018  Hearing? N  Vision? N  Difficulty concentrating or making decisions? N  Walking or climbing stairs? N  Dressing or bathing? N  Doing errands, shopping? N  Preparing Food and eating ? N  Using the Toilet? N  In the past six months, have you accidently leaked urine? N  Do you have problems with loss of bowel control? N  Managing your Medications? N  Managing your Finances? N  Housekeeping or managing your Housekeeping? N  Some recent data might be hidden     Diet 3 meals a day. Eats out often or will cook and freeze leftovers  Exercise Current Exercise Habits: Home exercise routine(walks some around house and up and down stairs), Time (Minutes): 25, Frequency (Times/Week): 4, Weekly Exercise (Minutes/Week): 100, Intensity: Mild, Exercise limited by: None identified   Depression Screen PHQ 2/9 Scores 05/22/2018 04/17/2018 03/24/2018 01/27/2018 07/10/2017 05/22/2017 04/23/2017  PHQ - 2 Score 0 0 0 0 0 0 0     Fall Risk Fall Risk  05/22/2018 04/17/2018 03/24/2018 01/27/2018 07/10/2017  Falls in the past year? Yes Yes No No No  Number falls in past yr: 1 1 - - -  Injury with Fall? Yes Yes - - -  Risk for fall due to : History of fall(s) History of fall(s) - - -  Follow up Falls prevention discussed - - - -    Safety Is the patient's home free of loose throw rugs in walkways, pet beds, electrical cords, etc?   yes      Grab bars in the bathroom? no      Walkin shower? yes      Shower Seat? no      Handrails on the stairs?   yes      Adequate lighting?   yes  Patient Care  Team: Dettinger, Fransisca Kaufmann, MD as PCP - General (Family Medicine) Dettinger, Fransisca Kaufmann, MD as Consulting Physician [facility])  Hospitalizations, surgeries, and ER visits in previous 12 months No hospitalizations, ER visits, or surgeries this past year.   Objective:    BP 140/74 (BP Location: Left Arm, Patient Position: Sitting, Cuff Size: Normal)   Pulse 86   Ht 5' 1.25" (1.556 m)   Wt 109 lb (49.4 kg)   BMI 20.43 kg/m    Advanced Directives 05/22/2018 04/23/2017 03/28/2016 03/28/2015 03/01/2015 03/03/2012  Does Patient Have a Medical Advance Directive? Yes Yes Yes Yes Yes Patient has advance directive, copy in chart  Type of Advance Directive [place];Living will [place];Living will [place];Living will [place];Living will - Living will  Does patient want to make changes to medical advance directive? No - Patient declined - No - Patient declined No - Patient declined - -  Copy of [place] in Chart? No - copy requested - No - copy requested No - copy requested - -  Would patient like information on creating a medical advance directive? No - Patient declined - - - - -  Pre-existing out of facility DNR order (yellow form or pink MOST form) - - - - - No    Hearing/Vision  normal or No deficits noted during visit.  Cognitive Function: MMSE - Mini Mental State Exam 05/22/2018  Orientation to time 5  Orientation to Place 5  Registration 3  Attention/ Calculation 5  Recall 2  Language- name 2 objects 2  Language- repeat 1  Language- follow 3 step command 3  Language- read & follow direction 1  Write a sentence 1  Copy design 1  Total score 29       Normal Cognitive Function Screening: Yes    Immunizations and Health Maintenance Immunization History  Administered Date(s) Administered  . Influenza Split 08/22/2011, 06/30/2012, 08/01/2016  . Influenza Whole 08/13/2008, 08/03/2009,  07/18/2010  . Influenza, High Dose Seasonal PF 06/22/2017  . Influenza,inj,Quad PF,6+ Mos 07/28/2013, 07/22/2015  . Influenza-Unspecified 09/21/2017  . Pneumococcal Conjugate-13 10/20/2013  . Pneumococcal Polysaccharide-23 08/13/2008  . Pneumococcal-Unspecified 11/12/2009  . Td 01/31/2010  . Tdap 08/22/2011   Health Maintenance Due  Topic Date Due  . INFLUENZA VACCINE  05/22/2018   Health Maintenance  Topic Date Due  . INFLUENZA VACCINE  05/22/2018  . COLONOSCOPY  01/18/2019  . MAMMOGRAM  11/14/2019  . TETANUS/TDAP  06/04/2023  . DEXA SCAN  Completed  . Hepatitis C Screening  Completed  . PNA vac Low Risk Adult  Completed        Assessment:   This is a routine wellness examination for [place].    Plan:    Goals    . Exercise 150 min/wk Moderate Activity        Health Maintenance Recommendations: Shingles Vaccine   Additional Screening Recommendations: Lung: Low Dose CT Chest recommended if Age 80-80 years, 30 pack-year currently smoking OR have quit w/in 15years. Patient does not qualify. Hepatitis C Screening recommended: no  Today's Orders No orders of the defined types were placed in this encounter.   Keep f/u with Dettinger, Fransisca Kaufmann, MD and any other specialty appointments you may have Continue current medications Move carefully to avoid falls. Use assistive devices like a can or walker if needed. Aim for at least 150 minutes of moderate activity a week. This can be done with chair exercises if necessary. Read or work on puzzles daily Stay connected with friends and family  I have personally reviewed and noted the following in the patient's chart:   . Medical and social history . Use of alcohol, tobacco or illicit drugs  . Current medications and supplements . Functional ability and status . Nutritional status . Physical activity . Advanced directives . List of other physicians . Hospitalizations, surgeries, and ER visits in previous 12  months . Vitals . Screenings to include cognitive, depression, and falls . Referrals and appointments  In addition, I have reviewed and discussed with patient certain preventive protocols, quality metrics, and best practice recommendations. A written personalized care plan for preventive services as well as general preventive health recommendations were provided to patient.     Chong Sicilian, RN   05/22/2018

## 2018-06-13 ENCOUNTER — Encounter: Payer: Self-pay | Admitting: Physician Assistant

## 2018-07-10 DIAGNOSIS — Z23 Encounter for immunization: Secondary | ICD-10-CM | POA: Diagnosis not present

## 2018-09-05 ENCOUNTER — Other Ambulatory Visit: Payer: Self-pay | Admitting: Family Medicine

## 2018-09-05 DIAGNOSIS — Z1231 Encounter for screening mammogram for malignant neoplasm of breast: Secondary | ICD-10-CM

## 2018-11-14 ENCOUNTER — Ambulatory Visit: Payer: Medicare Other

## 2018-11-27 ENCOUNTER — Other Ambulatory Visit: Payer: Self-pay | Admitting: Family Medicine

## 2018-11-27 DIAGNOSIS — I10 Essential (primary) hypertension: Secondary | ICD-10-CM

## 2018-11-27 NOTE — Telephone Encounter (Signed)
Last seen 04/17/18  Dr D

## 2018-12-15 ENCOUNTER — Ambulatory Visit: Payer: Medicare Other

## 2018-12-24 ENCOUNTER — Ambulatory Visit
Admission: RE | Admit: 2018-12-24 | Discharge: 2018-12-24 | Disposition: A | Discharge status: Home / Self Care | Payer: Medicare Other | Source: Ambulatory Visit | Attending: Family Medicine | Admitting: Family Medicine

## 2018-12-24 DIAGNOSIS — Z1231 Encounter for screening mammogram for malignant neoplasm of breast: Secondary | ICD-10-CM

## 2019-01-08 ENCOUNTER — Encounter: Payer: Self-pay | Admitting: Internal Medicine

## 2019-02-19 ENCOUNTER — Other Ambulatory Visit: Payer: Self-pay | Admitting: Family Medicine

## 2019-02-19 DIAGNOSIS — I10 Essential (primary) hypertension: Secondary | ICD-10-CM

## 2019-02-19 NOTE — Telephone Encounter (Signed)
Last seen 04/17/18

## 2019-02-19 NOTE — Telephone Encounter (Signed)
Schedule virtual visit for her

## 2019-02-23 ENCOUNTER — Ambulatory Visit (INDEPENDENT_AMBULATORY_CARE_PROVIDER_SITE_OTHER): Payer: Medicare Other | Admitting: Family Medicine

## 2019-02-23 ENCOUNTER — Encounter: Payer: Self-pay | Admitting: Family Medicine

## 2019-02-23 ENCOUNTER — Other Ambulatory Visit: Payer: Self-pay

## 2019-02-23 VITALS — BP 157/76 | HR 91 | Temp 98.0°F | Ht 61.25 in | Wt 110.0 lb

## 2019-02-23 DIAGNOSIS — E559 Vitamin D deficiency, unspecified: Secondary | ICD-10-CM | POA: Diagnosis not present

## 2019-02-23 DIAGNOSIS — I10 Essential (primary) hypertension: Secondary | ICD-10-CM | POA: Diagnosis not present

## 2019-02-23 DIAGNOSIS — I739 Peripheral vascular disease, unspecified: Secondary | ICD-10-CM

## 2019-02-23 DIAGNOSIS — E782 Mixed hyperlipidemia: Secondary | ICD-10-CM

## 2019-02-23 MED ORDER — ATORVASTATIN CALCIUM 80 MG PO TABS: ORAL_TABLET | ORAL | 3 refills | Status: DC

## 2019-02-23 MED ORDER — AMLODIPINE BESYLATE 5 MG PO TABS: 5.0000 mg | ORAL_TABLET | Freq: Every day | ORAL | 3 refills | Status: DC

## 2019-02-23 NOTE — Progress Notes (Signed)
BP (!) 157/76   Pulse 91   Temp 98 F (36.7 C) (Oral)   Ht 5' 1.25" (1.556 m)   Wt 110 lb (49.9 kg)   BMI 20.61 kg/m    Subjective:   Patient ID: Crystal [name], female    DOB: 08-Sep-1946, 73 y.o.   MRN: [id number]  HPI: Crystal [name] is a 73 y.o. female presenting on 02/23/2019 for Hypertension (check up of chronic medical conditions)   HPI Hypertension Patient is currently on amlodipine, and their blood pressure today is 157/76. Patient denies any lightheadedness or dizziness. Patient denies headaches, blurred vision, chest pains, shortness of breath, or weakness. Denies any side effects from medication and is content with current medication.  Patient has known peripheral vascular disease and taking high-dose Lipitor, we are monitoring for now, she also has a history of TIAs as well.  Hyperlipidemia Patient is coming in for recheck of his hyperlipidemia. The patient is currently taking Lipitor. They deny any issues with myalgias or history of liver damage from it. They deny any focal numbness or weakness or chest pain.  Patient had slightly elevated bilirubin but the rest of her liver panel looks well, we will check on this.  Vitamin D deficiency recheck Patient is due for a vitamin D deficiency recheck today.  We will check this with blood work.  Relevant past medical, surgical, family and social history reviewed and updated as indicated. Interim medical history since our last visit reviewed. Allergies and medications reviewed and updated.  Review of Systems  Constitutional: Negative for chills and fever.  Eyes: Negative for visual disturbance.  Respiratory: Negative for chest tightness and shortness of breath.   Cardiovascular: Negative for chest pain and leg swelling.  Genitourinary: Negative for dysuria and enuresis.  Musculoskeletal: Negative for back pain and gait problem.  Skin: Negative for rash.  Neurological: Negative for dizziness, weakness,  light-headedness, numbness and headaches.  Psychiatric/Behavioral: Negative for agitation and behavioral problems.  All other systems reviewed and are negative.   Per HPI unless specifically indicated above   Allergies as of 02/23/2019   No Known Allergies     Medication List       Accurate as of Feb 23, 2019  9:33 AM. Always use your most recent med list.        amLODipine 5 MG tablet Commonly known as:  NORVASC TAKE 1 TABLET (5 MG TOTAL) BY MOUTH DAILY. PT. NEEDS TO BE SEEN FOR FURTHER REFILLS   aspirin 81 MG tablet Take 81 mg by mouth daily.   atorvastatin 80 MG tablet Commonly known as:  LIPITOR TAKE 1/2 TABLET OR AS DIRECTED FOR CHOLESTEROL   polyethylene glycol 17 g packet Commonly known as:  MIRALAX / GLYCOLAX Take 17 g by mouth 3 (three) times a week. Reported on 04/02/2016   Vitamin D3 75 MCG (3000 UT) Tabs Take 1 tablet by mouth.        Objective:   BP (!) 157/76   Pulse 91   Temp 98 F (36.7 C) (Oral)   Ht 5' 1.25" (1.556 m)   Wt 110 lb (49.9 kg)   BMI 20.61 kg/m   Wt Readings from Last 3 Encounters:  02/23/19 110 lb (49.9 kg)  05/22/18 109 lb (49.4 kg)  04/17/18 110 lb 9.6 oz (50.2 kg)    Physical Exam Vitals signs and nursing note reviewed.  Constitutional:      General: She is not in acute distress.    Appearance: She  is well-developed. She is not diaphoretic.  Eyes:     Conjunctiva/sclera: Conjunctivae normal.  Cardiovascular:     Rate and Rhythm: Normal rate and regular rhythm.     Heart sounds: Normal heart sounds. No murmur.  Pulmonary:     Effort: Pulmonary effort is normal. No respiratory distress.     Breath sounds: Normal breath sounds. No wheezing.  Musculoskeletal: Normal range of motion.        General: No tenderness.  Skin:    General: Skin is warm and dry.     Findings: No rash.  Neurological:     Mental Status: She is alert and oriented to person, place, and time.     Coordination: Coordination normal.  Psychiatric:         Behavior: Behavior normal.       Assessment & Plan:   Problem List Items Addressed This Visit      Cardiovascular and Mediastinum   Essential hypertension - Primary   Relevant Medications   amLODipine (NORVASC) 5 MG tablet   atorvastatin (LIPITOR) 80 MG tablet   Other Relevant Orders   CMP14+EGFR   Peripheral vascular disease (HCC)   Relevant Medications   amLODipine (NORVASC) 5 MG tablet   atorvastatin (LIPITOR) 80 MG tablet     Other   Hyperlipidemia   Relevant Medications   amLODipine (NORVASC) 5 MG tablet   atorvastatin (LIPITOR) 80 MG tablet   Other Relevant Orders   Lipid panel   Vitamin D deficiency   Relevant Orders   VITAMIN D 25 Hydroxy (Vit-D Deficiency, Fractures)      Recommended for patient to check her blood pressures over the next couple weeks and if it is running high to send [place] a message or call us with the results.   Follow up plan: Return in about 6 months (around 08/26/2019), or if symptoms worsen or fail to improve, for Hypertension and cholesterol recheck.  Counseling provided for all of the vaccine components No orders of the defined types were placed in this encounter.   Caryl Pina, MD [facility] 02/23/2019, 9:33 AM

## 2019-02-25 LAB — CMP14+EGFR
ALT: 20 IU/L (ref 0–32)
AST: 23 IU/L (ref 0–40)
Albumin/Globulin Ratio: 2 (ref 1.2–2.2)
Albumin: 4.4 g/dL (ref 3.7–4.7)
Alkaline Phosphatase: 96 IU/L (ref 39–117)
BUN/Creatinine Ratio: 14 (ref 12–28)
BUN: 10 mg/dL (ref 8–27)
Bilirubin Total: 1.1 mg/dL (ref 0.0–1.2)
CO2: 24 mmol/L (ref 20–29)
Calcium: 9.9 mg/dL (ref 8.7–10.3)
Chloride: 102 mmol/L (ref 96–106)
Creatinine, Ser: 0.73 mg/dL (ref 0.57–1.00)
GFR calc Af Amer: 93 mL/min/{1.73_m2} (ref >59)
GFR calc non Af Amer: 81 mL/min/{1.73_m2} (ref >59)
Globulin, Total: 2.2 g/dL (ref 1.5–4.5)
Glucose: 93 mg/dL (ref 65–99)
Potassium: 3.9 mmol/L (ref 3.5–5.2)
Sodium: 142 mmol/L (ref 134–144)
Total Protein: 6.6 g/dL (ref 6.0–8.5)

## 2019-02-25 LAB — LIPID PANEL
Chol/HDL Ratio: 2.2 ratio (ref 0.0–4.4)
Cholesterol, Total: 148 mg/dL (ref 100–199)
HDL: 68 mg/dL (ref >39)
LDL Calculated: 64 mg/dL (ref 0–99)
Triglycerides: 80 mg/dL (ref 0–149)
VLDL Cholesterol Cal: 16 mg/dL (ref 5–40)

## 2019-02-25 LAB — VITAMIN D 25 HYDROXY (VIT D DEFICIENCY, FRACTURES): Vit D, 25-Hydroxy: 90.7 ng/mL (ref 30.0–100.0)

## 2019-06-03 ENCOUNTER — Telehealth: Payer: Self-pay | Admitting: Family Medicine

## 2019-06-03 DIAGNOSIS — Z23 Encounter for immunization: Secondary | ICD-10-CM | POA: Diagnosis not present

## 2019-06-03 NOTE — Telephone Encounter (Signed)
Year supply was given in May- patient aware to call [facility].

## 2019-07-27 ENCOUNTER — Ambulatory Visit (INDEPENDENT_AMBULATORY_CARE_PROVIDER_SITE_OTHER): Payer: Medicare Other | Admitting: Family Medicine

## 2019-07-27 NOTE — Progress Notes (Signed)
Patient did not answer phone after 2 attempts, left a voicemail did not get a call back. Caryl Pina, MD [facility] 07/27/2019, 4:28 PM

## 2019-08-07 ENCOUNTER — Other Ambulatory Visit: Payer: Self-pay

## 2019-08-07 DIAGNOSIS — Z20822 Contact with and (suspected) exposure to covid-19: Secondary | ICD-10-CM

## 2019-08-07 DIAGNOSIS — Z20828 Contact with and (suspected) exposure to other viral communicable diseases: Secondary | ICD-10-CM | POA: Diagnosis not present

## 2019-08-09 LAB — NOVEL CORONAVIRUS, NAA: SARS-CoV-2, NAA: NOT DETECTED

## 2019-11-23 ENCOUNTER — Other Ambulatory Visit: Payer: Self-pay | Admitting: Family Medicine

## 2019-11-23 DIAGNOSIS — Z1231 Encounter for screening mammogram for malignant neoplasm of breast: Secondary | ICD-10-CM

## 2019-11-26 ENCOUNTER — Other Ambulatory Visit: Payer: Self-pay | Admitting: Family Medicine

## 2019-11-26 DIAGNOSIS — I10 Essential (primary) hypertension: Secondary | ICD-10-CM

## 2019-12-01 DIAGNOSIS — Z23 Encounter for immunization: Secondary | ICD-10-CM | POA: Diagnosis not present

## 2019-12-29 DIAGNOSIS — Z23 Encounter for immunization: Secondary | ICD-10-CM | POA: Diagnosis not present

## 2020-01-01 ENCOUNTER — Ambulatory Visit
Admission: RE | Admit: 2020-01-01 | Discharge: 2020-01-01 | Disposition: A | Discharge status: Home / Self Care | Payer: Medicare Other | Source: Ambulatory Visit | Attending: Family Medicine | Admitting: Family Medicine

## 2020-01-01 ENCOUNTER — Other Ambulatory Visit: Payer: Self-pay

## 2020-01-01 DIAGNOSIS — Z1231 Encounter for screening mammogram for malignant neoplasm of breast: Secondary | ICD-10-CM | POA: Diagnosis not present

## 2020-02-02 ENCOUNTER — Other Ambulatory Visit: Payer: Self-pay | Admitting: Family Medicine

## 2020-02-02 DIAGNOSIS — I10 Essential (primary) hypertension: Secondary | ICD-10-CM

## 2020-02-02 NOTE — Telephone Encounter (Signed)
Dettinger NTBS 30 days given 11/26/19

## 2020-02-03 NOTE — Telephone Encounter (Signed)
Appt made.  Patient aware  

## 2020-02-08 ENCOUNTER — Other Ambulatory Visit: Payer: Self-pay

## 2020-02-08 ENCOUNTER — Ambulatory Visit (INDEPENDENT_AMBULATORY_CARE_PROVIDER_SITE_OTHER): Payer: Medicare Other | Admitting: Family Medicine

## 2020-02-08 ENCOUNTER — Encounter: Payer: Self-pay | Admitting: Family Medicine

## 2020-02-08 VITALS — BP 147/71 | HR 87 | Temp 97.5°F | Ht 61.25 in | Wt 99.5 lb

## 2020-02-08 DIAGNOSIS — K2100 Gastro-esophageal reflux disease with esophagitis, without bleeding: Secondary | ICD-10-CM | POA: Diagnosis not present

## 2020-02-08 DIAGNOSIS — I739 Peripheral vascular disease, unspecified: Secondary | ICD-10-CM | POA: Diagnosis not present

## 2020-02-08 DIAGNOSIS — E782 Mixed hyperlipidemia: Secondary | ICD-10-CM

## 2020-02-08 DIAGNOSIS — I10 Essential (primary) hypertension: Secondary | ICD-10-CM | POA: Diagnosis not present

## 2020-02-08 MED ORDER — AMLODIPINE BESYLATE 5 MG PO TABS: 5.0000 mg | ORAL_TABLET | Freq: Every day | ORAL | 3 refills | Status: DC

## 2020-02-08 MED ORDER — ATORVASTATIN CALCIUM 80 MG PO TABS: 40.0000 mg | ORAL_TABLET | Freq: Every day | ORAL | 3 refills | Status: DC

## 2020-02-08 NOTE — Progress Notes (Signed)
BP (!) 147/71   Pulse 87   Temp (!) 97.5 F (36.4 C) (Temporal)   Ht 5' 1.25" (1.556 m)   Wt 99 lb 8 oz (45.1 kg)   BMI 18.65 kg/m    Subjective:   Patient ID: Crystal [name], female    DOB: July 18, 1946, 74 y.o.   MRN: [id number]  HPI: Crystal [name] is a 74 y.o. female presenting on 02/08/2020 for Medical Management of Chronic Issues (Essential HTN and Mixed Hyperlipidemia).  1. Essential HTN:  Ms. Heady is taking Amlodipine 5 mg QD for her HTN. She states that she needs a refill of this medication today. She is not checking her BP at home, but she does have a home BP cuff. She denies headaches, dizziness, lightheadedness, and swelling of the feet/hands.   2. Mixed Hyperlipidemia  Ms. Briski is taking 1/2 tablet of Atorvastatin 80 mg QD. She needs a refill of this medication today as well.  She denies any myalgias.  Peripheral vascular disease Patient has a known history of peripheral vascular disease and is currently on aspirin for this and denies any peripheral edema or claudications in her lower extremities and feels like things are doing pretty good.  Relevant past medical, surgical, family and social history reviewed and updated as indicated. Interim medical history since our last visit reviewed. Allergies and medications reviewed and updated.  Review of Systems  Constitutional: Negative for chills and fever.  Eyes: Negative for visual disturbance.  Respiratory: Negative for chest tightness and shortness of breath.   Cardiovascular: Negative for chest pain and leg swelling.  Musculoskeletal: Negative for back pain and gait problem.  Skin: Negative for rash.  Neurological: Negative for dizziness, weakness, light-headedness and headaches.  Psychiatric/Behavioral: Negative for agitation and behavioral problems.  All other systems reviewed and are negative.   Per HPI unless specifically indicated above   Allergies as of 02/08/2020   No Known Allergies       Medication List       Accurate as of February 08, 2020  1:03 PM. If you have any questions, ask your nurse or doctor.        STOP taking these medications   Vitamin D3 75 MCG (3000 UT) Tabs Stopped by: Fransisca Kaufmann Sofi Bryars, MD     TAKE these medications   amLODipine 5 MG tablet Commonly known as: NORVASC Take 1 tablet (5 mg total) by mouth daily. (Needs to be seen before next refill)   aspirin 81 MG tablet Take 81 mg by mouth daily.   atorvastatin 80 MG tablet Commonly known as: LIPITOR Take 0.5 tablets (40 mg total) by mouth daily. TAKE 1/2 TABLET OR AS DIRECTED FOR CHOLESTEROL What changed:   how much to take  how to take this  when to take this Changed by: Fransisca Kaufmann Dayron Odland, MD   polyethylene glycol 17 g packet Commonly known as: MIRALAX / GLYCOLAX Take 17 g by mouth 3 (three) times a week. Reported on 04/02/2016        Objective:   BP (!) 147/71   Pulse 87   Temp (!) 97.5 F (36.4 C) (Temporal)   Ht 5' 1.25" (1.556 m)   Wt 99 lb 8 oz (45.1 kg)   BMI 18.65 kg/m   Wt Readings from Last 3 Encounters:  02/08/20 99 lb 8 oz (45.1 kg)  02/23/19 110 lb (49.9 kg)  05/22/18 109 lb (49.4 kg)    Physical Exam Vitals and nursing note reviewed.  Constitutional:  General: She is not in acute distress.    Appearance: She is well-developed. She is not diaphoretic.  Eyes:     Conjunctiva/sclera: Conjunctivae normal.  Cardiovascular:     Rate and Rhythm: Normal rate and regular rhythm.     Heart sounds: Normal heart sounds. No murmur.  Pulmonary:     Effort: Pulmonary effort is normal. No respiratory distress.     Breath sounds: Normal breath sounds. No wheezing.  Musculoskeletal:        General: No swelling.  Skin:    General: Skin is warm and dry.     Findings: No rash.  Neurological:     Mental Status: She is alert and oriented to person, place, and time.     Coordination: Coordination normal.  Psychiatric:        Behavior: Behavior normal.       General: Patient appears WDWN, stated age, and in no acute distress. Pulm: Clear to auscultation with normal work of breathing on room air. Cardio: RRR, S1/S2 auscultated. Radial and DP pulses palpated bilaterally.   Assessment & Plan:   Problem List Items Addressed This Visit      Cardiovascular and Mediastinum   Essential hypertension   Relevant Medications   amLODipine (NORVASC) 5 MG tablet   atorvastatin (LIPITOR) 80 MG tablet   Other Relevant Orders   CMP14+EGFR   Peripheral vascular disease (HCC)   Relevant Medications   amLODipine (NORVASC) 5 MG tablet   atorvastatin (LIPITOR) 80 MG tablet   Other Relevant Orders   CBC with Differential/Platelet     Other   Hyperlipidemia - Primary   Relevant Medications   amLODipine (NORVASC) 5 MG tablet   atorvastatin (LIPITOR) 80 MG tablet   Other Relevant Orders   CMP14+EGFR   Lipid panel      1. Essential HTN:  Continue on current HTN medication (Amlodipine 5 mg QD).  2. Hyperlipidemia:  Continue taking 1/2 tablet of Atorvastatin 80 mg. Will check CMP and Lipid Panel today.  Follow up plan: Return in about 6 months (around 08/09/2020), or if symptoms worsen or fail to improve, for Hypertension and cholesterol.  Orders Placed This Encounter  Procedures  . CMP14+EGFR  . Lipid panel  . CBC with Differential/Platelet    Gaynelle Arabian, PA-S2 [place] 02/08/2020, 1:03 PM Patient seen and examined with Gaynelle Arabian, PA student, agree with assessment and plan above.  We will continue patient's current medication, will do blood work, sounds like no major issues. Caryl Pina, MD [facility] 02/08/2020, 1:03 PM

## 2020-02-09 ENCOUNTER — Ambulatory Visit (INDEPENDENT_AMBULATORY_CARE_PROVIDER_SITE_OTHER): Payer: Medicare Other | Admitting: *Deleted

## 2020-02-09 DIAGNOSIS — Z0000 Encounter for general adult medical examination without abnormal findings: Secondary | ICD-10-CM | POA: Diagnosis not present

## 2020-02-09 LAB — CBC WITH DIFFERENTIAL/PLATELET
Basophils Absolute: 0.1 10*3/uL (ref 0.0–0.2)
Basos: 1 %
EOS (ABSOLUTE): 0 10*3/uL (ref 0.0–0.4)
Eos: 0 %
Hematocrit: 42.2 % (ref 34.0–46.6)
Hemoglobin: 13.8 g/dL (ref 11.1–15.9)
Immature Grans (Abs): 0 10*3/uL (ref 0.0–0.1)
Immature Granulocytes: 0 %
Lymphocytes Absolute: 1.3 10*3/uL (ref 0.7–3.1)
Lymphs: 12 %
MCH: 31.5 pg (ref 26.6–33.0)
MCHC: 32.7 g/dL (ref 31.5–35.7)
MCV: 96 fL (ref 79–97)
Monocytes Absolute: 0.6 10*3/uL (ref 0.1–0.9)
Monocytes: 5 %
Neutrophils Absolute: 8.9 10*3/uL — ABNORMAL HIGH (ref 1.4–7.0)
Neutrophils: 82 %
Platelets: 299 10*3/uL (ref 150–450)
RBC: 4.38 x10E6/uL (ref 3.77–5.28)
RDW: 11.5 % — ABNORMAL LOW (ref 11.7–15.4)
WBC: 10.9 10*3/uL — ABNORMAL HIGH (ref 3.4–10.8)

## 2020-02-09 LAB — CMP14+EGFR
ALT: 20 IU/L (ref 0–32)
AST: 27 IU/L (ref 0–40)
Albumin/Globulin Ratio: 2 (ref 1.2–2.2)
Albumin: 5 g/dL — ABNORMAL HIGH (ref 3.7–4.7)
Alkaline Phosphatase: 109 IU/L (ref 39–117)
BUN/Creatinine Ratio: 19 (ref 12–28)
BUN: 15 mg/dL (ref 8–27)
Bilirubin Total: 1.5 mg/dL — ABNORMAL HIGH (ref 0.0–1.2)
CO2: 26 mmol/L (ref 20–29)
Calcium: 10.3 mg/dL (ref 8.7–10.3)
Chloride: 101 mmol/L (ref 96–106)
Creatinine, Ser: 0.79 mg/dL (ref 0.57–1.00)
GFR calc Af Amer: 84 mL/min/{1.73_m2} (ref >59)
GFR calc non Af Amer: 73 mL/min/{1.73_m2} (ref >59)
Globulin, Total: 2.5 g/dL (ref 1.5–4.5)
Glucose: 96 mg/dL (ref 65–99)
Potassium: 4.4 mmol/L (ref 3.5–5.2)
Sodium: 144 mmol/L (ref 134–144)
Total Protein: 7.5 g/dL (ref 6.0–8.5)

## 2020-02-09 LAB — LIPID PANEL
Chol/HDL Ratio: 2.1 ratio (ref 0.0–4.4)
Cholesterol, Total: 172 mg/dL (ref 100–199)
HDL: 82 mg/dL (ref >39)
LDL Chol Calc (NIH): 76 mg/dL (ref 0–99)
Triglycerides: 76 mg/dL (ref 0–149)
VLDL Cholesterol Cal: 14 mg/dL (ref 5–40)

## 2020-02-09 NOTE — Progress Notes (Signed)
MEDICARE ANNUAL WELLNESS VISIT  02/09/2020  Telephone Visit Disclaimer This Medicare AWV was conducted by telephone due to national recommendations for restrictions regarding the COVID-19 Pandemic (e.g. social distancing).  I verified, using two identifiers, that I am speaking with Crystal [name] or their authorized healthcare agent. I discussed the limitations, risks, security, and privacy concerns of performing an evaluation and management service by telephone and the potential availability of an in-person appointment in the future. The patient expressed understanding and agreed to proceed.   Subjective:  Crystal [name] is a 74 y.o. female patient of Dettinger, Fransisca Kaufmann, MD who had a Medicare Annual Wellness Visit today via telephone. Crystal [name] is retired and lives alone. She is widowed and has one grown son. She  reports that she is socially active and does interact with friends/family regularly. She is minimally physically active and enjoys golf.  Patient Care Team: Dettinger, Fransisca Kaufmann, MD as PCP - General (Family Medicine) Dettinger, Fransisca Kaufmann, MD as Consulting Physician (Family Medicine)  Advanced Directives 02/09/2020 05/22/2018 04/23/2017 03/28/2016 03/28/2015 03/01/2015 03/03/2012  Does Patient Have a Medical Advance Directive? Yes Yes Yes Yes Yes Yes Patient has advance directive, copy in chart  Type of Advance Directive [place];Living will;Out of facility DNR (pink MOST or yellow form) [place];Living will [place];Living will [place];Living will [place];Living will - Living will  Does patient want to make changes to medical advance directive? - No - Patient declined - No - Patient declined No - Patient declined - -  Copy of [place] in Chart? No - copy requested No - copy requested - No - copy requested No - copy requested - -  Would patient like information on  creating a medical advance directive? - No - Patient declined - - - - -  Pre-existing out of facility DNR order (yellow form or pink MOST form) - - - - - - No    Hospital Utilization Over the Past 12 Months: # of hospitalizations or ER visits: 0 # of surgeries: 0  Review of Systems    Patient reports that her overall health is better compared to last year.  History obtained from the patient and patient chart.   Patient Reported Readings (BP, Pulse, CBG, Weight, etc) none  Pain Assessment Pain : No/denies pain     Current Medications & Allergies (verified) Allergies as of 02/09/2020   No Known Allergies     Medication List       Accurate as of February 09, 2020  9:56 AM. If you have any questions, ask your nurse or doctor.        amLODipine 5 MG tablet Commonly known as: NORVASC Take 1 tablet (5 mg total) by mouth daily. (Needs to be seen before next refill)   aspirin 81 MG tablet Take 81 mg by mouth daily.   atorvastatin 80 MG tablet Commonly known as: LIPITOR Take 0.5 tablets (40 mg total) by mouth daily. TAKE 1/2 TABLET OR AS DIRECTED FOR CHOLESTEROL   polyethylene glycol 17 g packet Commonly known as: MIRALAX / GLYCOLAX Take 17 g by mouth 3 (three) times a week. Reported on 04/02/2016       History (reviewed): Past Medical History:  Diagnosis Date  . Abdominal aortic ectasia ([place]) 01/31/2010   Qualifier: Diagnosis of  By: [name] MD, [name]   . Adjustment disorder with depressed mood   .  Anxiety   . 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 CEREBROVASCULAR DISEASE 06/16/2010   Qualifier: Diagnosis of  By: [name] MD, Hunt Oris   . CONSTIPATION 01/12/2009   Qualifier: Diagnosis of  By: [name] CMA ([place]), Mearl Latin    . Eosinophilic esophagitis   . ESOPHAGEAL STRICTURE 01/12/2009   Qualifier: Diagnosis of  By: [name] CMA ([place]), Mearl Latin    . EXTERNAL HEMORRHOIDS 01/12/2009   Qualifier: Diagnosis of  By:  [name] CMA ([place]), Mearl Latin    . GERD 05/10/2007   Qualifier: Diagnosis of  By: [name] RMA, Lucy    . HYPERLIPIDEMIA 05/10/2007   Qualifier: Diagnosis of  By: [name], Lucy    . HYPERTENSION 05/10/2007   Qualifier: Diagnosis of  By: [name], Lucy    . IBS (irritable bowel syndrome)   . Incontinence   . Irritable bowel syndrome 01/12/2009   Qualifier: Diagnosis of  By: [name] CMA (AAMA), Mearl Latin    . Melanosis   . Orthostatic hypotension   . Overactive detrusor   . PERIPHERAL VASCULAR DISEASE 06/17/2010   Qualifier: Diagnosis of  By: [name] MD, Hunt Oris   . Personal history of radiation therapy 2013  . Radiation 04/21/2012-06/11/2012   64 gray right breast  . RECTAL FISSURE 02/16/2009   Qualifier: Diagnosis of  By: [name] CMA ([place]), Mearl Latin    . Rectal stenosis   . STENOSIS, RECTAL 02/16/2009   Qualifier: Diagnosis of  By: [name] CMA ([place]), Mearl Latin    . Tachyarrhythmia 09/03/2009   Qualifier: Diagnosis of  By: [name] MD, [place] ISCHEMIC ATTACK 03/02/2010   Qualifier: Diagnosis of  By: [name] MD, [name] VENOUS INSUFFICIENCY, LEGS 07/18/2010   Qualifier: Diagnosis of  By: [name] MD, [name]    Past Surgical History:  Procedure Laterality Date  . ABDOMINAL HYSTERECTOMY    . BREAST BIOPSY Right 2014  . BREAST BIOPSY Right 2013   malignant  . BREAST EXCISIONAL BIOPSY Left 1972  . BREAST LUMPECTOMY Right 2013  . BREAST SURGERY Left    left breast x 2  . cardiac catherization    . CARDIAC CATHETERIZATION    . edg  04/21/2007  . ELECTROCARDIOGRAM  10/30/2006  . esophageal tumor excised; posterior 1986    . hemmorhoidectomy     x 3   Family History  Problem Relation Age of Onset  . Bone cancer Father   . Heart disease Brother        MI  . Aneurysm Brother   . Colon cancer Neg Hx   . Breast cancer Neg Hx   . Diabetes Neg Hx   . Anesthesia problems Neg Hx    Social History   Socioeconomic History  . Marital status: Widowed    Spouse name: Not on file  .  Number of children: 1  . Years of education: 70  . Highest education level: High school graduate  Occupational History  . Occupation: retired    Comment: Network engineer  Tobacco Use  . Smoking status: Former Smoker    Types: Cigarettes    Quit date: 10/22/1965    Years since quitting: 54.3  . Smokeless tobacco: Never Used  Substance and Sexual Activity  . Alcohol use: Yes    Alcohol/week: 3.0 standard drinks    Types: 3 Glasses of wine per week    Comment: occasional   . Drug use: No  . Sexual activity: Not Currently  Other Topics Concern  . Not on file  Social History Narrative   HSG   Married '71-  Widowed '08   1 son - '76; no grandchildren   Retired, Lives alone. I-ADL's   End of Life: No resuscitation, no mechanical ventilation, no futile heroic measures.   Patient is a former smoker, quit 40 years ago.   Alcohol use-yes occ wine   Daily caffeine use 2 cups coffee / day   Illicit drug use-no   Patient does not get regular exercise.   Social Determinants of Health   Financial Resource Strain:   . Difficulty of Paying Living Expenses:   Food Insecurity:   . Worried About Charity fundraiser in the Last Year:   . Arboriculturist in the Last Year:   Transportation Needs:   . Film/video editor (Medical):   Marland Kitchen Lack of Transportation (Non-Medical):   Physical Activity:   . Days of Exercise per Week:   . Minutes of Exercise per Session:   Stress:   . Feeling of Stress :   Social Connections:   . Frequency of Communication with Friends and Family:   . Frequency of Social Gatherings with Friends and Family:   . Attends Religious Services:   . Active Member of Clubs or Organizations:   . Attends Archivist Meetings:   Marland Kitchen Marital Status:     Activities of Daily Living In your present state of health, do you have any difficulty performing the following activities: 02/09/2020  Hearing? N  Vision? N  Difficulty concentrating or making decisions? N  Walking or  climbing stairs? N  Dressing or bathing? N  Doing errands, shopping? N  Preparing Food and eating ? N  Using the Toilet? N  In the past six months, have you accidently leaked urine? N  Do you have problems with loss of bowel control? N  Managing your Medications? N  Managing your Finances? N  Housekeeping or managing your Housekeeping? N  Some recent data might be hidden    Patient Education/ Literacy How often do you need to have someone help you when you read instructions, pamphlets, or other written materials from your doctor or pharmacy?: 1 - Never What is the last grade level you completed in school?: 12  Exercise Current Exercise Habits: Home exercise routine, Type of exercise: walking, Time (Minutes): 40, Frequency (Times/Week): 2, Weekly Exercise (Minutes/Week): 80, Intensity: Mild, Exercise limited by: None identified  Diet Patient reports consuming 3 meals a day and 2 snack(s) a day Patient reports that her primary diet is: Regular Patient reports that she does have regular access to food.   Depression Screen PHQ 2/9 Scores 02/09/2020 02/08/2020 02/23/2019 05/22/2018 04/17/2018 03/24/2018 01/27/2018  PHQ - 2 Score 0 0 0 0 0 0 0  PHQ- 9 Score - 3 - - - - -     Fall Risk Fall Risk  02/09/2020 02/08/2020 02/23/2019 05/22/2018 04/17/2018  Falls in the past year? 0 0 0 Yes Yes  Number falls in past yr: - - - 1 1  Injury with Fall? - - - Yes Yes  Risk for fall due to : - - - History of fall(s) History of fall(s)  Follow up - - - Falls prevention discussed -     Objective:  Crystal [name] seemed alert and oriented and she participated appropriately during our telephone visit.  Blood Pressure Weight BMI  BP Readings from Last 3 Encounters:  02/08/20 Marland Kitchen)  147/71  02/23/19 (!) 157/76  05/22/18 140/74   Wt Readings from Last 3 Encounters:  02/08/20 99 lb 8 oz (45.1 kg)  02/23/19 110 lb (49.9 kg)  05/22/18 109 lb (49.4 kg)   BMI Readings from Last 1 Encounters:  02/08/20 18.65  kg/m    *Unable to obtain current vital signs, weight, and BMI due to telephone visit type  Hearing/Vision  . Crystal [name] did not seem to have difficulty with hearing/understanding during the telephone conversation . Reports that she has had a formal eye exam by an eye care professional within the past year . Reports that she has not had a formal hearing evaluation within the past year *Unable to fully assess hearing and vision during telephone visit type  Cognitive Function: 6CIT Screen 02/09/2020  What Year? 0 points  What month? 0 points  What time? 0 points  Count back from 20 0 points  Months in reverse 0 points  Repeat phrase 0 points  Total Score 0   (Normal:0-7, Significant for Dysfunction: >8)  Normal Cognitive Function Screening: Yes   Immunization & Health Maintenance Record Immunization History  Administered Date(s) Administered  . Influenza Split 08/22/2011, 06/30/2012, 08/01/2016  . Influenza Whole 08/13/2008, 08/03/2009, 07/18/2010  . Influenza, High Dose Seasonal PF 06/22/2017, 07/10/2018, 06/03/2019  . Influenza,inj,Quad PF,6+ Mos 07/28/2013, 07/22/2015  . Influenza-Unspecified 09/21/2017  . Moderna SARS-COVID-2 Vaccination 12/27/2019  . Pneumococcal Conjugate-13 10/20/2013  . Pneumococcal Polysaccharide-23 08/13/2008, 11/20/2009  . Pneumococcal-Unspecified 11/12/2009  . Td 01/31/2010  . Tdap 08/22/2011  . Zoster Recombinat (Shingrix) 04/14/2019    Health Maintenance  Topic Date Due  . COVID-19 Vaccine (2 - Moderna 2-dose series) 01/24/2020  . INFLUENZA VACCINE  05/22/2020  . TETANUS/TDAP  06/04/2023  . DEXA SCAN  Completed  . Hepatitis C Screening  Completed  . PNA vac Low Risk Adult  Completed       Assessment  This is a routine wellness examination for Crystal [name].  Health Maintenance: Due or Overdue Health Maintenance Due  Topic Date Due  . COVID-19 Vaccine (2 - Moderna 2-dose series) 01/24/2020    Crystal [name] does not need a  referral for Community Assistance: Care Management:   no Social Work:    no Prescription Assistance:  no Nutrition/Diabetes Education:  no   Plan:  Personalized Goals Goals Addressed            This Visit's Progress   . Patient Stated       02/09/2020 AWV Goal: Keep All Scheduled Appointments  Over the next year, patient will attend all scheduled appointments with their PCP and any specialists that they see.       Personalized Health Maintenance & Screening Recommendations    Lung Cancer Screening Recommended: no (Low Dose CT Chest recommended if Age 43-80 years, 30 pack-year currently smoking OR have quit w/in past 15 years) Hepatitis C Screening recommended: no HIV Screening recommended: no  Advanced Directives: Written information was not prepared per patient's request.  Referrals & Orders No orders of the defined types were placed in this encounter.   Follow-up Plan . Follow-up with Dettinger, Fransisca Kaufmann, MD as planned  I have personally reviewed and noted the following in the patient's chart:   . Medical and social history . Use of alcohol, tobacco or illicit drugs  . Current medications and supplements . Functional ability and status . Nutritional status . Physical activity . Advanced directives . List of other physicians . Hospitalizations, surgeries, and ER  visits in previous 12 months . Vitals . Screenings to include cognitive, depression, and falls . Referrals and appointments  In addition, I have reviewed and discussed with Crystal [name] certain preventive protocols, quality metrics, and best practice recommendations. A written personalized care plan for preventive services as well as general preventive health recommendations is available and can be mailed to the patient at her request.      Baldomero Lamy, LPN  [id number]

## 2020-07-29 DIAGNOSIS — Z23 Encounter for immunization: Secondary | ICD-10-CM | POA: Diagnosis not present

## 2020-07-29 DIAGNOSIS — I700 Atherosclerosis of aorta: Secondary | ICD-10-CM | POA: Diagnosis not present

## 2020-07-29 DIAGNOSIS — S4991XA Unspecified injury of right shoulder and upper arm, initial encounter: Secondary | ICD-10-CM | POA: Diagnosis not present

## 2020-07-29 DIAGNOSIS — S299XXA Unspecified injury of thorax, initial encounter: Secondary | ICD-10-CM | POA: Diagnosis not present

## 2020-07-29 DIAGNOSIS — M79621 Pain in right upper arm: Secondary | ICD-10-CM | POA: Diagnosis not present

## 2020-08-26 ENCOUNTER — Telehealth: Payer: Self-pay | Admitting: Family Medicine

## 2020-08-26 NOTE — Telephone Encounter (Signed)
Pt stated that she had fallen and hurt her arm, wanted to talk with someone but said it was not an emergency

## 2020-09-09 DIAGNOSIS — Z23 Encounter for immunization: Secondary | ICD-10-CM | POA: Diagnosis not present

## 2021-02-05 ENCOUNTER — Other Ambulatory Visit: Payer: Self-pay | Admitting: Family Medicine

## 2021-02-05 DIAGNOSIS — I10 Essential (primary) hypertension: Secondary | ICD-10-CM

## 2021-02-09 ENCOUNTER — Other Ambulatory Visit: Payer: Self-pay | Admitting: Family Medicine

## 2021-02-09 DIAGNOSIS — E782 Mixed hyperlipidemia: Secondary | ICD-10-CM

## 2021-02-09 DIAGNOSIS — I739 Peripheral vascular disease, unspecified: Secondary | ICD-10-CM

## 2021-03-04 ENCOUNTER — Other Ambulatory Visit: Payer: Self-pay | Admitting: Family Medicine

## 2021-03-04 DIAGNOSIS — I739 Peripheral vascular disease, unspecified: Secondary | ICD-10-CM

## 2021-03-04 DIAGNOSIS — E782 Mixed hyperlipidemia: Secondary | ICD-10-CM

## 2021-05-19 DIAGNOSIS — Z23 Encounter for immunization: Secondary | ICD-10-CM | POA: Diagnosis not present

## 2021-11-20 ENCOUNTER — Ambulatory Visit: Payer: Medicare Other | Admitting: Family Medicine

## 2024-04-15 ENCOUNTER — Observation Stay (HOSPITAL_COMMUNITY)
Admission: EM | Admit: 2024-04-15 | Discharge: 2024-04-16 | Disposition: A | Discharge status: Home Health | Attending: Family Medicine | Admitting: Family Medicine

## 2024-04-15 ENCOUNTER — Emergency Department (HOSPITAL_COMMUNITY)

## 2024-04-15 ENCOUNTER — Encounter (HOSPITAL_COMMUNITY): Payer: Self-pay | Admitting: Emergency Medicine

## 2024-04-15 ENCOUNTER — Other Ambulatory Visit: Payer: Self-pay

## 2024-04-15 DIAGNOSIS — G9341 Metabolic encephalopathy: Secondary | ICD-10-CM | POA: Diagnosis not present

## 2024-04-15 DIAGNOSIS — Z8673 Personal history of transient ischemic attack (TIA), and cerebral infarction without residual deficits: Secondary | ICD-10-CM | POA: Diagnosis not present

## 2024-04-15 DIAGNOSIS — I160 Hypertensive urgency: Secondary | ICD-10-CM | POA: Diagnosis not present

## 2024-04-15 DIAGNOSIS — C50311 Malignant neoplasm of lower-inner quadrant of right female breast: Secondary | ICD-10-CM | POA: Diagnosis present

## 2024-04-15 DIAGNOSIS — Z853 Personal history of malignant neoplasm of breast: Secondary | ICD-10-CM | POA: Insufficient documentation

## 2024-04-15 DIAGNOSIS — R296 Repeated falls: Secondary | ICD-10-CM | POA: Diagnosis not present

## 2024-04-15 DIAGNOSIS — Z7982 Long term (current) use of aspirin: Secondary | ICD-10-CM | POA: Diagnosis not present

## 2024-04-15 DIAGNOSIS — I10 Essential (primary) hypertension: Secondary | ICD-10-CM | POA: Diagnosis present

## 2024-04-15 DIAGNOSIS — Z79899 Other long term (current) drug therapy: Secondary | ICD-10-CM | POA: Diagnosis not present

## 2024-04-15 LAB — COMPREHENSIVE METABOLIC PANEL WITH GFR
ALT: 13 U/L (ref 0–44)
AST: 26 U/L (ref 15–41)
Albumin: 4.1 g/dL (ref 3.5–5.0)
Alkaline Phosphatase: 84 U/L (ref 38–126)
Anion gap: 12 (ref 5–15)
BUN: 20 mg/dL (ref 8–23)
CO2: 29 mmol/L (ref 22–32)
Calcium: 9.8 mg/dL (ref 8.9–10.3)
Chloride: 100 mmol/L (ref 98–111)
Creatinine, Ser: 1.01 mg/dL — ABNORMAL HIGH (ref 0.44–1.00)
GFR, Estimated: 56 mL/min — ABNORMAL LOW (ref >60)
Glucose, Bld: 105 mg/dL — ABNORMAL HIGH (ref 70–99)
Potassium: 3.5 mmol/L (ref 3.5–5.1)
Sodium: 141 mmol/L (ref 135–145)
Total Bilirubin: 2.1 mg/dL — ABNORMAL HIGH (ref 0.0–1.2)
Total Protein: 7.4 g/dL (ref 6.5–8.1)

## 2024-04-15 LAB — RAPID URINE DRUG SCREEN, HOSP PERFORMED
Amphetamines: NOT DETECTED
Barbiturates: NOT DETECTED
Benzodiazepines: NOT DETECTED
Cocaine: NOT DETECTED
Opiates: NOT DETECTED
Tetrahydrocannabinol: NOT DETECTED

## 2024-04-15 LAB — URINALYSIS, ROUTINE W REFLEX MICROSCOPIC
Bilirubin Urine: NEGATIVE
Glucose, UA: NEGATIVE mg/dL
Ketones, ur: 5 mg/dL — AB
Leukocytes,Ua: NEGATIVE
Nitrite: NEGATIVE
Protein, ur: 100 mg/dL — AB
Specific Gravity, Urine: 1.03 (ref 1.005–1.030)
pH: 5 (ref 5.0–8.0)

## 2024-04-15 LAB — DIFFERENTIAL
Abs Immature Granulocytes: 0.04 10*3/uL (ref 0.00–0.07)
Basophils Absolute: 0 10*3/uL (ref 0.0–0.1)
Basophils Relative: 0 %
Eosinophils Absolute: 0 10*3/uL (ref 0.0–0.5)
Eosinophils Relative: 1 %
Immature Granulocytes: 1 %
Lymphocytes Relative: 9 %
Lymphs Abs: 0.7 10*3/uL (ref 0.7–4.0)
Monocytes Absolute: 0.3 10*3/uL (ref 0.1–1.0)
Monocytes Relative: 4 %
Neutro Abs: 6.9 10*3/uL (ref 1.7–7.7)
Neutrophils Relative %: 85 %

## 2024-04-15 LAB — CBC
HCT: 38.2 % (ref 36.0–46.0)
Hemoglobin: 13 g/dL (ref 12.0–15.0)
MCH: 31.8 pg (ref 26.0–34.0)
MCHC: 34 g/dL (ref 30.0–36.0)
MCV: 93.4 fL (ref 80.0–100.0)
Platelets: 268 10*3/uL (ref 150–400)
RBC: 4.09 MIL/uL (ref 3.87–5.11)
RDW: 12.6 % (ref 11.5–15.5)
WBC: 8.1 10*3/uL (ref 4.0–10.5)
nRBC: 0 % (ref 0.0–0.2)

## 2024-04-15 LAB — ETHANOL: Alcohol, Ethyl (B): <15 mg/dL (ref <15)

## 2024-04-15 LAB — CK: Total CK: 124 U/L (ref 38–234)

## 2024-04-15 MED ORDER — AMLODIPINE BESYLATE 5 MG PO TABS
5.0000 mg | ORAL_TABLET | Freq: Every day | ORAL | Status: DC
  Administered 2024-04-15 – 2024-04-16 (×2): 5 mg via ORAL
  Filled 2024-04-15 (×2): qty 1

## 2024-04-15 MED ORDER — LABETALOL HCL 5 MG/ML IV SOLN
10.0000 mg | Freq: Once | INTRAVENOUS | Status: DC
  Filled 2024-04-15: qty 4

## 2024-04-15 MED ORDER — SODIUM CHLORIDE 0.9 % IV SOLN: INTRAVENOUS | Status: DC

## 2024-04-15 MED ORDER — ACETAMINOPHEN 650 MG RE SUPP: 650.0000 mg | Freq: Four times a day (QID) | RECTAL | Status: DC | PRN

## 2024-04-15 MED ORDER — LABETALOL HCL 5 MG/ML IV SOLN
10.0000 mg | Freq: Once | INTRAVENOUS | Status: AC
  Administered 2024-04-15: 10 mg via INTRAVENOUS
  Filled 2024-04-15: qty 4

## 2024-04-15 MED ORDER — ONDANSETRON HCL 4 MG/2ML IJ SOLN: 4.0000 mg | Freq: Four times a day (QID) | INTRAMUSCULAR | Status: DC | PRN

## 2024-04-15 MED ORDER — ENOXAPARIN SODIUM 30 MG/0.3ML IJ SOSY
30.0000 mg | PREFILLED_SYRINGE | INTRAMUSCULAR | Status: DC
  Administered 2024-04-15: 30 mg via SUBCUTANEOUS
  Filled 2024-04-15: qty 0.3

## 2024-04-15 MED ORDER — LABETALOL HCL 5 MG/ML IV SOLN
10.0000 mg | INTRAVENOUS | Status: DC | PRN
  Administered 2024-04-15 (×2): 10 mg via INTRAVENOUS
  Filled 2024-04-15 (×2): qty 4

## 2024-04-15 MED ORDER — IOHEXOL 350 MG/ML SOLN
75.0000 mL | Freq: Once | INTRAVENOUS | Status: AC | PRN
  Administered 2024-04-15: 75 mL via INTRAVENOUS

## 2024-04-15 MED ORDER — ACETAMINOPHEN 325 MG PO TABS: 650.0000 mg | ORAL_TABLET | Freq: Four times a day (QID) | ORAL | Status: DC | PRN

## 2024-04-15 MED ORDER — LABETALOL HCL 5 MG/ML IV SOLN: 20.0000 mg | Freq: Once | INTRAVENOUS | Status: DC

## 2024-04-15 MED ORDER — POTASSIUM CHLORIDE CRYS ER 20 MEQ PO TBCR
40.0000 meq | EXTENDED_RELEASE_TABLET | Freq: Once | ORAL | Status: AC
  Administered 2024-04-15: 40 meq via ORAL
  Filled 2024-04-15: qty 2

## 2024-04-15 MED ORDER — POLYETHYLENE GLYCOL 3350 17 G PO PACK: 17.0000 g | PACK | Freq: Every day | ORAL | Status: DC | PRN

## 2024-04-15 MED ORDER — ONDANSETRON HCL 4 MG PO TABS: 4.0000 mg | ORAL_TABLET | Freq: Four times a day (QID) | ORAL | Status: DC | PRN

## 2024-04-15 NOTE — Assessment & Plan Note (Signed)
 Reports generalized weakness, chronic poor oral intake and dizziness. Lives alone.  MRI brain-small chronic infarcts, chronically advanced cerebellar atrophy, mild to moderate cerebral atrophy, small vessel ischemic disease.  Pelvic x-ray, CT head and neck, CT spine negative for acute abnormality, no large vessel occlusion -Obtain orthostatic vitals -PT eval - LR 75cc/hr x 20hrs

## 2024-04-15 NOTE — Assessment & Plan Note (Addendum)
 Blood pressure elevated to 225/67.  History of hypertension, but she is not on any medications at home.  Per outpatient notes-she used to be on amlodipine  5 mg. - IV labetalol 10 mg for systolic> 180.  She has received 2 doses, with improvement in blood pressure -Start amlodipine  5 mg daily -If persistent high blood pressure may need to transfer to the unit on drip.

## 2024-04-15 NOTE — Assessment & Plan Note (Signed)
 2013.  Status post lumpectomy, radiation.  Currently not on medications.

## 2024-04-15 NOTE — ED Notes (Signed)
 ED TO INPATIENT HANDOFF REPORT  ED Nurse Name and Phone #: [phone]  S Name/Age/Gender Crystal [name] 78 y.o. female Room/Bed: APA15/APA15  Code Status   Code Status: Not on file  Home/SNF/Other Home Patient oriented to: self and place Is this baseline? Yes   Triage Complete: Triage complete  Chief Complaint Multiple falls [R29.6]  Triage Note Pt bib rcems after neighbor witnessed pt stumbling on her steps. Neighbor stated she did not fall at that time but was assisted to the ground. Pt states they believe she may have previously fallen due to recent progressively worsening weakness and complaints of left elbow and foot pain. Pt has hx of breast cancer and dementia, but reports has not seen a dr in 3 years.  Pt is hypertensive in department   Allergies No Known Allergies  Level of Care/Admitting Diagnosis ED Disposition     ED Disposition  Admit   Condition  --   Comment  Hospital Area: [facility] [[id number]]  Level of Care: Telemetry [5]  Covid Evaluation: Asymptomatic - no recent exposure (last 10 days) testing not required  Diagnosis: Multiple falls [721903]  Admitting Physician: EMOKPAE, EJIROGHENE E [[id number]]  Attending Physician: EMOKPAE, EJIROGHENE E [[id number]]          B Medical/Surgery History Past Medical History:  Diagnosis Date   Abdominal aortic ectasia (HCC) 01/31/2010   Qualifier: Diagnosis of  By: Harlow MD, Ozell BRAVO    Adjustment disorder with depressed mood    Anxiety    Breast cancer (HCC) 03/11/12   right breast lumpectomy=invasive lobular ca,grade I/III,ER?PR=positive   CEREBROVASCULAR DISEASE 06/16/2010   Qualifier: Diagnosis of  By: Norleen MD, Lynwood ORN    CONSTIPATION 01/12/2009   Qualifier: Diagnosis of  By: Genie CMA (AAMA), Leisha     Eosinophilic esophagitis    ESOPHAGEAL STRICTURE 01/12/2009   Qualifier: Diagnosis of  By: Genie CMA (AAMA), Leisha     EXTERNAL HEMORRHOIDS 01/12/2009   Qualifier: Diagnosis of  By: Genie CMA  (AAMA), Leisha     GERD 05/10/2007   Qualifier: Diagnosis of  By: Wilhemina RMA, Lucy     HYPERLIPIDEMIA 05/10/2007   Qualifier: Diagnosis of  By: Wilhemina KRAFT, Lucy     HYPERTENSION 05/10/2007   Qualifier: Diagnosis of  By: Wilhemina RMA, Lucy     IBS (irritable bowel syndrome)    Incontinence    Irritable bowel syndrome 01/12/2009   Qualifier: Diagnosis of  By: Genie CMA (AAMA), Leisha     Melanosis    Orthostatic hypotension    Overactive detrusor    PERIPHERAL VASCULAR DISEASE 06/17/2010   Qualifier: Diagnosis of  By: Norleen MD, Lynwood ORN    Personal history of radiation therapy 2013   Radiation 04/21/2012-06/11/2012   64 gray right breast   RECTAL FISSURE 02/16/2009   Qualifier: Diagnosis of  By: Genie CMA (AAMA), Leisha     Rectal stenosis    STENOSIS, RECTAL 02/16/2009   Qualifier: Diagnosis of  By: Genie CMA (AAMA), Chick     Tachyarrhythmia 09/03/2009   Qualifier: Diagnosis of  By: Harlow MD, Ozell BRAVO    TRANSIENT ISCHEMIC ATTACK 03/02/2010   Qualifier: Diagnosis of  By: Harlow MD, Ozell BRAVO    VENOUS INSUFFICIENCY, LEGS 07/18/2010   Qualifier: Diagnosis of  By: Harlow MD, Ozell BRAVO    Past Surgical History:  Procedure Laterality Date   ABDOMINAL HYSTERECTOMY     BREAST BIOPSY Right 2014   BREAST BIOPSY Right 2013   malignant  BREAST EXCISIONAL BIOPSY Left 1972   BREAST LUMPECTOMY Right 2013   BREAST SURGERY Left    left breast x 2   cardiac catherization     CARDIAC CATHETERIZATION     edg  04/21/2007   ELECTROCARDIOGRAM  10/30/2006   esophageal tumor excised; posterior 1986     hemmorhoidectomy     x 3     A IV Location/Drains/Wounds Patient Lines/Drains/Airways Status     Active Line/Drains/Airways     Name Placement date Placement time Site Days   Peripheral IV 04/15/24 20 G Right Antecubital 04/15/24  1200  Antecubital  less than 1   Incision 03/11/12 Breast Right 03/11/12  1308  -- 4418            Intake/Output Last 24 hours No intake or output data in the 24  hours ending 04/15/24 1718  Labs/Imaging Results for orders placed or performed during the hospital encounter of 04/15/24 (from the past 48 hours)  Urinalysis, Routine w reflex microscopic -Urine, Clean Catch     Status: Abnormal   Collection Time: 04/15/24 10:54 AM  Result Value Ref Range   Color, Urine AMBER (A) YELLOW    Comment: BIOCHEMICALS MAY BE AFFECTED BY COLOR   APPearance CLEAR CLEAR   Specific Gravity, Urine 1.030 1.005 - 1.030   pH 5.0 5.0 - 8.0   Glucose, UA NEGATIVE NEGATIVE mg/dL   Hgb urine dipstick SMALL (A) NEGATIVE   Bilirubin Urine NEGATIVE NEGATIVE   Ketones, ur 5 (A) NEGATIVE mg/dL   Protein, ur 899 (A) NEGATIVE mg/dL   Nitrite NEGATIVE NEGATIVE   Leukocytes,Ua NEGATIVE NEGATIVE   RBC / HPF 0-5 0 - 5 RBC/hpf   WBC, UA 6-10 0 - 5 WBC/hpf   Bacteria, UA RARE (A) NONE SEEN   Squamous Epithelial / HPF 0-5 0 - 5 /HPF   Mucus PRESENT    Hyaline Casts, UA PRESENT     Comment: Performed at [facility], [address] [place], [place] [address]  CBC     Status: None   Collection Time: 04/15/24 11:23 AM  Result Value Ref Range   WBC 8.1 4.0 - 10.5 K/uL   RBC 4.09 3.87 - 5.11 MIL/uL   Hemoglobin 13.0 12.0 - 15.0 g/dL   HCT 61.7 63.9 - 53.9 %   MCV 93.4 80.0 - 100.0 fL   MCH 31.8 26.0 - 34.0 pg   MCHC 34.0 30.0 - 36.0 g/dL   RDW 87.3 88.4 - 84.4 %   Platelets 268 150 - 400 K/uL   nRBC 0.0 0.0 - 0.2 %    Comment: Performed at [facility], [address] [place], [place] [address]  Differential     Status: None   Collection Time: 04/15/24 11:23 AM  Result Value Ref Range   Neutrophils Relative % 85 %   Neutro Abs 6.9 1.7 - 7.7 K/uL   Lymphocytes Relative 9 %   Lymphs Abs 0.7 0.7 - 4.0 K/uL   Monocytes Relative 4 %   Monocytes Absolute 0.3 0.1 - 1.0 K/uL   Eosinophils Relative 1 %   Eosinophils Absolute 0.0 0.0 - 0.5 K/uL   Basophils Relative 0 %   Basophils Absolute 0.0 0.0 - 0.1 K/uL   Immature Granulocytes 1 %   Abs Immature Granulocytes 0.04 0.00 -  0.07 K/uL    Comment: Performed at [facility], [address] [place], [place] [address]  Comprehensive metabolic panel     Status: Abnormal   Collection  Time: 04/15/24 11:23 AM  Result Value Ref Range   Sodium 141 135 - 145 mmol/L   Potassium 3.5 3.5 - 5.1 mmol/L   Chloride 100 98 - 111 mmol/L   CO2 29 22 - 32 mmol/L   Glucose, Bld 105 (H) 70 - 99 mg/dL    Comment: Glucose reference range applies only to samples taken after fasting for at least 8 hours.   BUN 20 8 - 23 mg/dL   Creatinine, Ser 8.98 (H) 0.44 - 1.00 mg/dL   Calcium  9.8 8.9 - 10.3 mg/dL   Total Protein 7.4 6.5 - 8.1 g/dL   Albumin 4.1 3.5 - 5.0 g/dL   AST 26 15 - 41 U/L   ALT 13 0 - 44 U/L   Alkaline Phosphatase 84 38 - 126 U/L   Total Bilirubin 2.1 (H) 0.0 - 1.2 mg/dL   GFR, Estimated 56 (L) >60 mL/min    Comment: (NOTE) Calculated using the CKD-EPI Creatinine Equation (2021)    Anion gap 12 5 - 15    Comment: Performed at [facility], [address] [place], [place] [address]  Ethanol     Status: None   Collection Time: 04/15/24 11:23 AM  Result Value Ref Range   Alcohol, Ethyl (B) <15 <15 mg/dL    Comment: (NOTE) For medical purposes only. Performed at [facility], [address] [place], [place] [address]   CK     Status: None   Collection Time: 04/15/24 11:23 AM  Result Value Ref Range   Total CK 124 38 - 234 U/L    Comment: Performed at [facility], [address] [place], [place] [address]   CT C-SPINE NO CHARGE Result Date: 04/15/2024 CLINICAL DATA:  Fall.  Weakness. EXAM: CT CERVICAL SPINE WITH CONTRAST TECHNIQUE: Multiplanar CT images of the cervical spine were reconstructed from contemporary CTA of the neck. RADIATION DOSE REDUCTION: This exam was performed according to the departmental dose-optimization program which includes automated exposure control, adjustment of the mA and/or kV according to patient size and/or use of iterative reconstruction technique. CONTRAST:  No additional. COMPARISON:   Cervical spine MRI 03/31/2015 FINDINGS: Alignment: Mild reversal of the normal cervical lordosis. No evidence of acute traumatic subluxation. Skull base and vertebrae: No acute fracture or suspicious lesion. Soft tissues and spinal canal: No prevertebral fluid or swelling. No visible canal hematoma. Disc levels: Disc degeneration from C4-5 through C6-7, including severe disc space narrowing and degenerative endplate changes at C4-5. At least mild spinal stenosis from C4-5 through C6-7. Uncovertebral spurring results in moderate left neural foraminal stenosis at C4-5 and C5-6 and severe bilateral neural foraminal stenosis at C6-7. Upper chest: Right greater than left pleuroparenchymal apical lung scarring. Air-filled, patulous esophagus. Other: None. IMPRESSION: No acute cervical spine fracture or traumatic subluxation. Electronically Signed   By: Dasie Hamburg M.D.   On: 04/15/2024 14:27   CT ANGIO HEAD NECK W WO CM Result Date: 04/15/2024 CLINICAL DATA:  Neuro deficit, acute, stroke suspected. Fall. Weakness. EXAM: CT ANGIOGRAPHY HEAD AND NECK WITH AND WITHOUT CONTRAST TECHNIQUE: Multidetector CT imaging of the head and neck was performed using the standard protocol during bolus administration of intravenous contrast. Multiplanar CT image reconstructions and MIPs were obtained to evaluate the vascular anatomy. Carotid stenosis measurements (when applicable) are obtained utilizing NASCET criteria, using the distal internal carotid diameter as the denominator. RADIATION DOSE REDUCTION: This exam was performed according to the departmental dose-optimization program which includes automated exposure control, adjustment of the mA  and/or kV according to patient size and/or use of iterative reconstruction technique. CONTRAST:  75mL OMNIPAQUE  IOHEXOL  350 MG/ML SOLN COMPARISON:  Head MRI 04/15/2024 and MRA 12/18/2002 FINDINGS: CT HEAD FINDINGS Brain: There is no evidence of an acute infarct, intracranial hemorrhage,  mass, midline shift, or extra-axial fluid collection. Mild-to-moderate cerebral atrophy and chronically advanced cerebellar atrophy are again noted. Cerebral white matter hypodensities are nonspecific but compatible with mild chronic small vessel ischemic disease. Vascular: Calcified atherosclerosis at the skull base. Skull: No fracture or suspicious lesion. Sinuses/Orbits: Mild mucosal thickening and secretions in the right maxillary sinus. Clear mastoid air cells. Bilateral cataract extraction. Other: None. Review of the MIP images confirms the above findings CTA NECK FINDINGS Aortic arch: Standard branching with moderate atherosclerotic calcification. Patent brachiocephalic and subclavian arteries with calcified and soft plaque in the proximal left subclavian artery not resulting in significant stenosis. Right carotid system: Patent without evidence stenosis, dissection, or significant atherosclerosis. Left carotid system: Patent with a small amount of calcified plaque at the carotid bifurcation. No evidence of a significant stenosis or dissection. Vertebral arteries: Patent without evidence of stenosis, dissection, or significant atherosclerosis. Mildly dominant right vertebral artery. Skeleton: Cervical spine reported separately.  No suspicious lesion. Other neck: No evidence of cervical lymphadenopathy or mass. Upper chest: Pleuroparenchymal scarring in the lung apices, right greater than left. Air-filled, patulous esophagus. Review of the MIP images confirms the above findings CTA HEAD FINDINGS Anterior circulation: The internal carotid arteries are patent from skull base to carotid termini with atherosclerosis resulting in up to mild bilateral paraclinoid stenoses. There is a 2 mm protrusion from the left supraclinoid ICA in the posterior communicating region without a visible associated vessel. This was likely present on the 2004 MRA although artifact on that study limits evaluation. ACAs and MCAs are  patent without evidence of a proximal branch occlusion or significant proximal stenosis. Posterior circulation: The intracranial vertebral arteries are patent to the basilar with calcified plaque in the proximal left V4 segment resulting in up to mild stenosis. Patent bilateral PICA, right AICA, and bilateral SCA origins are visualized. The basilar artery is widely patent. There are moderately large right and diminutive or absent left posterior communicating arteries. The PCAs are patent with new widespread atherosclerosis including severe P1 and P2 stenoses bilaterally. No aneurysm is identified. Venous sinuses: Patent. Anatomic variants: None. Review of the MIP images confirms the above findings IMPRESSION: 1. No evidence of acute intracranial abnormality. 2. Mild chronic small vessel ischemic disease. Cerebellar greater than cerebral atrophy. 3. No large vessel occlusion. 4. Intracranial atherosclerosis including severe bilateral PCA stenoses. 5. No hemodynamically significant stenosis in the neck. 6. 2 mm left supraclinoid ICA infundibulum versus aneurysm. 7.  Aortic Atherosclerosis (ICD10-I70.0). Electronically Signed   By: Dasie Hamburg M.D.   On: 04/15/2024 14:22   MR BRAIN WO CONTRAST Result Date: 04/15/2024 CLINICAL DATA:  Falls.  Arm weakness.  Dizziness. EXAM: MRI HEAD WITHOUT CONTRAST TECHNIQUE: Multiplanar, multiecho pulse sequences of the brain and surrounding structures were obtained without intravenous contrast. COMPARISON:  Head MRI 11/14/2012 FINDINGS: Brain: There is no evidence of an acute infarct, intracranial hemorrhage, mass, midline shift, or extra-axial fluid collection. Scattered T2 hyperintensities in the cerebral white matter bilaterally have progressed and are nonspecific but compatible with mild chronic small vessel ischemic disease. A small chronic cortical infarct laterally in the left parietal lobe is new, as is a chronic lacunar infarct in the right subinsular white matter.  Chronically advanced cerebellar atrophy has mildly  progressed. There is progressive, mild-to-moderate cerebral atrophy. Vascular: Major intracranial vascular flow voids are preserved. Skull and upper cervical spine: No suspicious marrow lesion. Disc degeneration at C4-5. Sinuses/Orbits: Bilateral cataract extraction. Mild mucosal thickening and small volume secretions in the right maxillary sinus. Clear mastoid air cells. Other: None. IMPRESSION: 1. No acute intracranial abnormality. 2. Progressive, mild chronic small vessel ischemic disease with small chronic infarcts as above. 3. Chronically advanced cerebellar atrophy. Mild-to-moderate cerebral atrophy. Electronically Signed   By: Dasie Hamburg M.D.   On: 04/15/2024 13:48   DG Pelvis Portable Result Date: 04/15/2024 CLINICAL DATA:  Fall. EXAM: PORTABLE PELVIS 1-2 VIEWS COMPARISON:  April 17, 2018. FINDINGS: There is no evidence of pelvic fracture or diastasis. No pelvic bone lesions are seen. IMPRESSION: Negative. Electronically Signed   By: Lynwood Landy Raddle M.D.   On: 04/15/2024 12:22   DG Chest Portable 1 View Result Date: 04/15/2024 CLINICAL DATA:  Fall.  Weakness EXAM: PORTABLE CHEST 1 VIEW COMPARISON:  Chest x-ray 03/03/2012 FINDINGS: Hyperinflation. No consolidation, pneumothorax or effusion. No edema. Normal cardiopericardial silhouette. Calcified aorta. Calcifications along the tracheobronchial tree. Surgical clips in the upper mediastinum. Surgical clips are along the right hemithorax and right axillary region. Overlapping cardiac leads. If there is further concern of the sequela of trauma additional workup with CT as clinically appropriate. IMPRESSION: Hyperinflation with chronic changes. No acute cardiopulmonary disease. Electronically Signed   By: Ranell Bring M.D.   On: 04/15/2024 12:20    Pending Labs Unresulted Labs (From admission, onward)     Start     Ordered   04/15/24 1108  Urine rapid drug screen (hosp performed)  Add-on,   AD         04/15/24 1108            Vitals/Pain Today's Vitals   04/15/24 1400 04/15/24 1500 04/15/24 1503 04/15/24 1600  BP: 139/88 (!) 160/60 (!) 225/67 (!) 163/68  Pulse: 64 66 70 67  Resp: 18 (!) 21 18 11   Temp:      TempSrc:      SpO2: 94% 98% 100% 97%  Weight:      Height:      PainSc:        Isolation Precautions No active isolations  Medications Medications  iohexol  (OMNIPAQUE ) 350 MG/ML injection 75 mL (75 mLs Intravenous Contrast Given 04/15/24 1328)  labetalol (NORMODYNE) injection 10 mg (10 mg Intravenous Given 04/15/24 1520)  labetalol (NORMODYNE) injection 10 mg (10 mg Intravenous Given 04/15/24 1718)    Mobility walks with person assist     Focused Assessments    R Recommendations: See Admitting Provider Note  Report given to:   Additional Notes: Pt has dementia

## 2024-04-15 NOTE — ED Triage Notes (Signed)
 Pt bib rcems after neighbor witnessed pt stumbling on her steps. Neighbor stated she did not fall at that time but was assisted to the ground. Pt states they believe she may have previously fallen due to recent progressively worsening weakness and complaints of left elbow and foot pain. Pt has hx of breast cancer and dementia, but reports has not seen a dr in 3 years.  Pt is hypertensive in department

## 2024-04-15 NOTE — ED Provider Notes (Signed)
 [place] EMERGENCY DEPARTMENT AT [facility] Provider Note   CSN: [id number] Arrival date & time: 04/15/24  1025     Patient presents with: Weakness and Fall   Crystal [name] is a 78 y.o. female.  {Add pertinent medical, surgical, social history, OB history to HPI:7983} 78 year old female with a history of hypertension, hyperlipidemia, and suspected dementia who presents to the emergency department after a fall.  History obtained per the patient, her son, and neighbor.  She does have a history of some memory difficulties so history is somewhat limited.  She was with her neighbor last night at 5:30 PM and was doing well.  Called her neighbor this morning and reported that she had fallen multiple times.  Did slumped to the ground in front of the neighbor when they got to her apartment.  Does not know why she was falling.  Unsure of how long she was on the ground.  Does not think she had any injuries.  Has not seen a doctor in years.  Not on any medications.       Prior to Admission medications   Medication Sig Start Date End Date Taking? Authorizing Provider  amLODipine  (NORVASC ) 5 MG tablet Take 1 tablet (5 mg total) by mouth daily. (Needs to be seen before next refill) 02/08/20   Dettinger, Fonda LABOR, MD  aspirin 81 MG tablet Take 81 mg by mouth daily.    [provider]  atorvastatin  (LIPITOR) 80 MG tablet Take 0.5 tablets (40 mg total) by mouth daily. TAKE 1/2 TABLET OR AS DIRECTED FOR CHOLESTEROL 02/08/20   Dettinger, Fonda LABOR, MD  polyethylene glycol (MIRALAX / GLYCOLAX) packet Take 17 g by mouth 3 (three) times a week. Reported on 04/02/2016    [provider]    Allergies: Patient has no known allergies.    Review of Systems  Updated Vital Signs Ht 5' (1.524 m)   Wt 37.6 kg   BMI 16.21 kg/m   Physical Exam Vitals and nursing note reviewed.  Constitutional:      General: She is not in acute distress.    Appearance: She is well-developed.   HENT:     Head: Normocephalic and atraumatic.     Right Ear: External ear normal.     Left Ear: External ear normal.     Nose: Nose normal.   Eyes:     Extraocular Movements: Extraocular movements intact.     Conjunctiva/sclera: Conjunctivae normal.     Pupils: Pupils are equal, round, and reactive to light.    Cardiovascular:     Rate and Rhythm: Normal rate and regular rhythm.     Heart sounds: No murmur heard. Pulmonary:     Effort: Pulmonary effort is normal. No respiratory distress.     Breath sounds: Normal breath sounds.  Abdominal:     General: Abdomen is flat. There is no distension.     Palpations: Abdomen is soft. There is no mass.     Tenderness: There is no abdominal tenderness. There is no guarding.   Musculoskeletal:     Cervical back: Normal range of motion and neck supple.   Skin:    General: Skin is warm and dry.   Neurological:     Mental Status: She is alert. Mental status is at baseline.     Comments: NIHSS Exam  Level of Consciousness: Alert  LOC Questions: Does not answer Month and Age Correctly  LOC Commands: Opens and Closes Eyes and Hands on  command  Best Gaze: Horizontal ocular movements intact  Visual Fields: No visual field loss  Facial Palsy: None  L Upper Extremity Motor: No drift after 10 seconds  R Upper Extremity Motor: No drift after 10 seconds  L Lower extremity Motor: No drift after 5 seconds  R Lower extremity Motor: No drift after 5 seconds  Ataxia: Absent  Sensory: Intact sensation to light touch on face, arms, trunk, and legs bilaterally  Best Language: No aphasia  Dysarthria: No dysarthria  Neglect: No visual or sensory neglect    Psychiatric:        Mood and Affect: Mood normal.     (all labs ordered are listed, but only abnormal results are displayed) Labs Reviewed  URINALYSIS, ROUTINE W REFLEX MICROSCOPIC  CBC  DIFFERENTIAL  COMPREHENSIVE METABOLIC PANEL WITH GFR  ETHANOL  RAPID URINE DRUG SCREEN, HOSP  PERFORMED  CK  I-STAT CHEM 8, ED    EKG: EKG Interpretation Date/Time:  Wednesday April 15 2024 10:43:50 EDT Ventricular Rate:  62 PR Interval:  158 QRS Duration:  93 QT Interval:  457 QTC Calculation: 465 R Axis:   20  Text Interpretation: Sinus rhythm Probable left atrial enlargement Probable left ventricular hypertrophy Confirmed by Yolande Charleston [phone]) on 04/15/2024 10:57:36 AM  Radiology: No results found.  {Document cardiac monitor, telemetry assessment procedure when appropriate:32947} Procedures   Medications Ordered in the ED - No data to display    {Click here for ABCD2, HEART and other calculators REFRESH Note before signing:1}                              Medical Decision Making Amount and/or Complexity of Data Reviewed Labs: ordered. Radiology: ordered.  Risk Prescription drug management.   ***  {Document critical care time when appropriate  Document review of labs and clinical decision tools ie CHADS2VASC2, etc  Document your independent review of radiology images and any outside records  Document your discussion with family members, caretakers and with consultants  Document social determinants of health affecting pt's care  Document your decision making why or why not admission, treatments were needed:32947:::1}   Final diagnoses:  None    ED Discharge Orders     None

## 2024-04-15 NOTE — ED Provider Notes (Signed)
   Clinical Course as of 04/15/24 1713  Wed Apr 15, 2024  1637 78 y/o with dizziness, mild confusion, frequent falls. Stroke workup negative. Has been persistently hypertensive despite labetalol, plan for admission. Received sign out from Dr. Yolande [WS]  [phone] Signed out to Dr. Pearlean  [WS]    Clinical Course User Index [WS] Francesca Elsie CROME, MD   Medical Decision Making Amount and/or Complexity of Data Reviewed Labs: ordered. Radiology: ordered.  Risk Prescription drug management. Decision regarding hospitalization.        Francesca Elsie CROME, MD 04/15/24 [phone]

## 2024-04-15 NOTE — H&P (Signed)
 History and Physical    Crystal [name] FMW:[id number] DOB: 20-May-1946 DOA: 04/15/2024  PCP: Patient, No Pcp Per   Patient coming from: Home  I have personally briefly reviewed patient's old medical records in [facility]  Chief Complaint: Falls  HPI: Crystal [name] is a 78 y.o. female with medical history significant for hypertension, breast cancer, CVA, IBS, GERD. Patient was brought to the ED with complaints of multiple falls, weakness.  Patient lives alone.  She ambulates without assistance or assistive devices.  She reports falls over the past few years, but recently falls have been more frequent.  She reports weakness, chronic poor oral intake, intermittent dizziness.  No vomiting or diarrhea.  No chest or abdominal pain.  No change in urinary habits.  ED Course: Temperature 97.6.  Heart rate 60s to 77.  Respiratory rate 11-28.  Elevated blood pressure systolic up to 225/67. Brain MRI-no acute abnormality.  Progressive mild chronic small vessel ischemic disease, small chronic infarcts. CTA head and neck-no acute cervical spine fracture or traumatic subluxation. CT spine-negative for acute abnormality. Portable chest and pelvic x-ray-hyperinflation with no chronic changes. IV Labetalol 10 mg x 1 given with improvement in blood pressure. Hospitalist to admit for falls, elevated blood pressure.  Review of Systems: As per HPI all other systems reviewed and negative.  Past Medical History:  Diagnosis Date   Abdominal aortic ectasia (HCC) 01/31/2010   Qualifier: Diagnosis of  By: Harlow MD, Ozell BRAVO    Adjustment disorder with depressed mood    Anxiety    Breast cancer (HCC) 03/11/12   right breast lumpectomy=invasive lobular ca,grade I/III,ER?PR=positive   CEREBROVASCULAR DISEASE 06/16/2010   Qualifier: Diagnosis of  By: Norleen MD, Lynwood ORN    CONSTIPATION 01/12/2009   Qualifier: Diagnosis of  By: Genie CMA (AAMA), Leisha     Eosinophilic esophagitis    ESOPHAGEAL  STRICTURE 01/12/2009   Qualifier: Diagnosis of  By: Genie CMA (AAMA), Leisha     EXTERNAL HEMORRHOIDS 01/12/2009   Qualifier: Diagnosis of  By: Genie CMA (AAMA), Leisha     GERD 05/10/2007   Qualifier: Diagnosis of  By: Wilhemina RMA, Lucy     HYPERLIPIDEMIA 05/10/2007   Qualifier: Diagnosis of  By: Wilhemina KRAFT, Lucy     HYPERTENSION 05/10/2007   Qualifier: Diagnosis of  By: Wilhemina RMA, Lucy     IBS (irritable bowel syndrome)    Incontinence    Irritable bowel syndrome 01/12/2009   Qualifier: Diagnosis of  By: Genie CMA (AAMA), Leisha     Melanosis    Orthostatic hypotension    Overactive detrusor    PERIPHERAL VASCULAR DISEASE 06/17/2010   Qualifier: Diagnosis of  By: Norleen MD, Lynwood ORN    Personal history of radiation therapy 2013   Radiation 04/21/2012-06/11/2012   64 gray right breast   RECTAL FISSURE 02/16/2009   Qualifier: Diagnosis of  By: Genie CMA (AAMA), Leisha     Rectal stenosis    STENOSIS, RECTAL 02/16/2009   Qualifier: Diagnosis of  By: Genie CMA (AAMA), Chick     Tachyarrhythmia 09/03/2009   Qualifier: Diagnosis of  By: Harlow MD, Ozell BRAVO    TRANSIENT ISCHEMIC ATTACK 03/02/2010   Qualifier: Diagnosis of  By: Harlow MD, Ozell BRAVO    VENOUS INSUFFICIENCY, LEGS 07/18/2010   Qualifier: Diagnosis of  By: Harlow MD, Ozell BRAVO     Past Surgical History:  Procedure Laterality Date   ABDOMINAL HYSTERECTOMY     BREAST BIOPSY Right 2014  BREAST BIOPSY Right 2013   malignant   BREAST EXCISIONAL BIOPSY Left 1972   BREAST LUMPECTOMY Right 2013   BREAST SURGERY Left    left breast x 2   cardiac catherization     CARDIAC CATHETERIZATION     edg  04/21/2007   ELECTROCARDIOGRAM  10/30/2006   esophageal tumor excised; posterior 1986     hemmorhoidectomy     x 3     reports that she quit smoking about 58 years ago. Her smoking use included cigarettes. She has never used smokeless tobacco. She reports current alcohol use of about 3.0 standard drinks of alcohol per week. She reports  that she does not use drugs.  No Known Allergies  Family History  Problem Relation Age of Onset   Bone cancer Father    Heart disease Brother        MI   Aneurysm Brother    Colon cancer Neg Hx    Breast cancer Neg Hx    Diabetes Neg Hx    Anesthesia problems Neg Hx     Prior to Admission medications   Medication Sig Start Date End Date Taking? Authorizing Provider  aspirin 81 MG tablet Take 81 mg by mouth daily.   Yes [provider]  polyethylene glycol (MIRALAX / GLYCOLAX) packet Take 17 g by mouth 3 (three) times a week. Reported on 04/02/2016   Yes [provider]    Physical Exam: Vitals:   04/15/24 1400 04/15/24 1500 04/15/24 1503 04/15/24 1600  BP: 139/88 (!) 160/60 (!) 225/67 (!) 163/68  Pulse: 64 66 70 67  Resp: 18 (!) 21 18 11   Temp:      TempSrc:      SpO2: 94% 98% 100% 97%  Weight:      Height:        Constitutional: NAD, calm, comfortable Vitals:   04/15/24 1400 04/15/24 1500 04/15/24 1503 04/15/24 1600  BP: 139/88 (!) 160/60 (!) 225/67 (!) 163/68  Pulse: 64 66 70 67  Resp: 18 (!) 21 18 11   Temp:      TempSrc:      SpO2: 94% 98% 100% 97%  Weight:      Height:       Eyes: PERRL, lids and conjunctivae normal ENMT: Mucous membranes are moist. Neck: normal, supple, no masses, no thyromegaly Respiratory: clear to auscultation bilaterally, no wheezing, no crackles. Normal respiratory effort. No accessory muscle use.  Cardiovascular: Regular rate and rhythm, no murmurs / rubs / gallops. No extremity edema.  Extremities warm.  Abdomen: no tenderness, no masses palpated. No hepatosplenomegaly. Musculoskeletal: no clubbing / cyanosis. No joint deformity upper and lower extremities.  Skin: no rashes, lesions, ulcers. No induration Neurologic: No facial asymmetry, good and equal grip strength, 4+/5 strength in all extremities. Psychiatric: Normal judgment and insight. Alert and oriented x 3. Normal mood.   Labs on Admission: I have  personally reviewed following labs and imaging studies  CBC: Recent Labs  Lab 04/15/24 1123  WBC 8.1  NEUTROABS 6.9  HGB 13.0  HCT 38.2  MCV 93.4  PLT 268   Basic Metabolic Panel: Recent Labs  Lab 04/15/24 1123  NA 141  K 3.5  CL 100  CO2 29  GLUCOSE 105*  BUN 20  CREATININE 1.01*  CALCIUM  9.8   GFR: Estimated Creatinine Clearance: 26.4 mL/min (A) (by C-G formula based on SCr of 1.01 mg/dL (H)). Liver Function Tests: Recent Labs  Lab 04/15/24 1123  AST 26  ALT  13  ALKPHOS 84  BILITOT 2.1*  PROT 7.4  ALBUMIN 4.1   Cardiac Enzymes: Recent Labs  Lab 04/15/24 1123  CKTOTAL 124   Urine analysis:    Component Value Date/Time   COLORURINE AMBER (A) 04/15/2024 1054   APPEARANCEUR CLEAR 04/15/2024 1054   APPEARANCEUR Clear 01/27/2018 0851   LABSPEC 1.030 04/15/2024 1054   PHURINE 5.0 04/15/2024 1054   GLUCOSEU NEGATIVE 04/15/2024 1054   GLUCOSEU NEGATIVE 01/27/2009 0833   HGBUR SMALL (A) 04/15/2024 1054   BILIRUBINUR NEGATIVE 04/15/2024 1054   BILIRUBINUR Negative 01/27/2018 0851   KETONESUR 5 (A) 04/15/2024 1054   PROTEINUR 100 (A) 04/15/2024 1054   UROBILINOGEN 0.2 03/28/2015 1042   NITRITE NEGATIVE 04/15/2024 1054   LEUKOCYTESUR NEGATIVE 04/15/2024 1054    Radiological Exams on Admission: CT C-SPINE NO CHARGE Result Date: 04/15/2024 CLINICAL DATA:  Fall.  Weakness. EXAM: CT CERVICAL SPINE WITH CONTRAST TECHNIQUE: Multiplanar CT images of the cervical spine were reconstructed from contemporary CTA of the neck. RADIATION DOSE REDUCTION: This exam was performed according to the departmental dose-optimization program which includes automated exposure control, adjustment of the mA and/or kV according to patient size and/or use of iterative reconstruction technique. CONTRAST:  No additional. COMPARISON:  Cervical spine MRI 03/31/2015 FINDINGS: Alignment: Mild reversal of the normal cervical lordosis. No evidence of acute traumatic subluxation. Skull base and  vertebrae: No acute fracture or suspicious lesion. Soft tissues and spinal canal: No prevertebral fluid or swelling. No visible canal hematoma. Disc levels: Disc degeneration from C4-5 through C6-7, including severe disc space narrowing and degenerative endplate changes at C4-5. At least mild spinal stenosis from C4-5 through C6-7. Uncovertebral spurring results in moderate left neural foraminal stenosis at C4-5 and C5-6 and severe bilateral neural foraminal stenosis at C6-7. Upper chest: Right greater than left pleuroparenchymal apical lung scarring. Air-filled, patulous esophagus. Other: None. IMPRESSION: No acute cervical spine fracture or traumatic subluxation. Electronically Signed   By: Dasie Hamburg M.D.   On: 04/15/2024 14:27   CT ANGIO HEAD NECK W WO CM Result Date: 04/15/2024 CLINICAL DATA:  Neuro deficit, acute, stroke suspected. Fall. Weakness. EXAM: CT ANGIOGRAPHY HEAD AND NECK WITH AND WITHOUT CONTRAST TECHNIQUE: Multidetector CT imaging of the head and neck was performed using the standard protocol during bolus administration of intravenous contrast. Multiplanar CT image reconstructions and MIPs were obtained to evaluate the vascular anatomy. Carotid stenosis measurements (when applicable) are obtained utilizing NASCET criteria, using the distal internal carotid diameter as the denominator. RADIATION DOSE REDUCTION: This exam was performed according to the departmental dose-optimization program which includes automated exposure control, adjustment of the mA and/or kV according to patient size and/or use of iterative reconstruction technique. CONTRAST:  75mL OMNIPAQUE  IOHEXOL  350 MG/ML SOLN COMPARISON:  Head MRI 04/15/2024 and MRA 12/18/2002 FINDINGS: CT HEAD FINDINGS Brain: There is no evidence of an acute infarct, intracranial hemorrhage, mass, midline shift, or extra-axial fluid collection. Mild-to-moderate cerebral atrophy and chronically advanced cerebellar atrophy are again noted. Cerebral  white matter hypodensities are nonspecific but compatible with mild chronic small vessel ischemic disease. Vascular: Calcified atherosclerosis at the skull base. Skull: No fracture or suspicious lesion. Sinuses/Orbits: Mild mucosal thickening and secretions in the right maxillary sinus. Clear mastoid air cells. Bilateral cataract extraction. Other: None. Review of the MIP images confirms the above findings CTA NECK FINDINGS Aortic arch: Standard branching with moderate atherosclerotic calcification. Patent brachiocephalic and subclavian arteries with calcified and soft plaque in the proximal left subclavian artery not resulting in significant  stenosis. Right carotid system: Patent without evidence stenosis, dissection, or significant atherosclerosis. Left carotid system: Patent with a small amount of calcified plaque at the carotid bifurcation. No evidence of a significant stenosis or dissection. Vertebral arteries: Patent without evidence of stenosis, dissection, or significant atherosclerosis. Mildly dominant right vertebral artery. Skeleton: Cervical spine reported separately.  No suspicious lesion. Other neck: No evidence of cervical lymphadenopathy or mass. Upper chest: Pleuroparenchymal scarring in the lung apices, right greater than left. Air-filled, patulous esophagus. Review of the MIP images confirms the above findings CTA HEAD FINDINGS Anterior circulation: The internal carotid arteries are patent from skull base to carotid termini with atherosclerosis resulting in up to mild bilateral paraclinoid stenoses. There is a 2 mm protrusion from the left supraclinoid ICA in the posterior communicating region without a visible associated vessel. This was likely present on the 2004 MRA although artifact on that study limits evaluation. ACAs and MCAs are patent without evidence of a proximal branch occlusion or significant proximal stenosis. Posterior circulation: The intracranial vertebral arteries are patent to  the basilar with calcified plaque in the proximal left V4 segment resulting in up to mild stenosis. Patent bilateral PICA, right AICA, and bilateral SCA origins are visualized. The basilar artery is widely patent. There are moderately large right and diminutive or absent left posterior communicating arteries. The PCAs are patent with new widespread atherosclerosis including severe P1 and P2 stenoses bilaterally. No aneurysm is identified. Venous sinuses: Patent. Anatomic variants: None. Review of the MIP images confirms the above findings IMPRESSION: 1. No evidence of acute intracranial abnormality. 2. Mild chronic small vessel ischemic disease. Cerebellar greater than cerebral atrophy. 3. No large vessel occlusion. 4. Intracranial atherosclerosis including severe bilateral PCA stenoses. 5. No hemodynamically significant stenosis in the neck. 6. 2 mm left supraclinoid ICA infundibulum versus aneurysm. 7.  Aortic Atherosclerosis (ICD10-I70.0). Electronically Signed   By: Dasie Hamburg M.D.   On: 04/15/2024 14:22   MR BRAIN WO CONTRAST Result Date: 04/15/2024 CLINICAL DATA:  Falls.  Arm weakness.  Dizziness. EXAM: MRI HEAD WITHOUT CONTRAST TECHNIQUE: Multiplanar, multiecho pulse sequences of the brain and surrounding structures were obtained without intravenous contrast. COMPARISON:  Head MRI 11/14/2012 FINDINGS: Brain: There is no evidence of an acute infarct, intracranial hemorrhage, mass, midline shift, or extra-axial fluid collection. Scattered T2 hyperintensities in the cerebral white matter bilaterally have progressed and are nonspecific but compatible with mild chronic small vessel ischemic disease. A small chronic cortical infarct laterally in the left parietal lobe is new, as is a chronic lacunar infarct in the right subinsular white matter. Chronically advanced cerebellar atrophy has mildly progressed. There is progressive, mild-to-moderate cerebral atrophy. Vascular: Major intracranial vascular flow voids  are preserved. Skull and upper cervical spine: No suspicious marrow lesion. Disc degeneration at C4-5. Sinuses/Orbits: Bilateral cataract extraction. Mild mucosal thickening and small volume secretions in the right maxillary sinus. Clear mastoid air cells. Other: None. IMPRESSION: 1. No acute intracranial abnormality. 2. Progressive, mild chronic small vessel ischemic disease with small chronic infarcts as above. 3. Chronically advanced cerebellar atrophy. Mild-to-moderate cerebral atrophy. Electronically Signed   By: Dasie Hamburg M.D.   On: 04/15/2024 13:48   DG Pelvis Portable Result Date: 04/15/2024 CLINICAL DATA:  Fall. EXAM: PORTABLE PELVIS 1-2 VIEWS COMPARISON:  April 17, 2018. FINDINGS: There is no evidence of pelvic fracture or diastasis. No pelvic bone lesions are seen. IMPRESSION: Negative. Electronically Signed   By: Lynwood Landy Raddle M.D.   On: 04/15/2024 12:22   DG  Chest Portable 1 View Result Date: 04/15/2024 CLINICAL DATA:  Fall.  Weakness EXAM: PORTABLE CHEST 1 VIEW COMPARISON:  Chest x-ray 03/03/2012 FINDINGS: Hyperinflation. No consolidation, pneumothorax or effusion. No edema. Normal cardiopericardial silhouette. Calcified aorta. Calcifications along the tracheobronchial tree. Surgical clips in the upper mediastinum. Surgical clips are along the right hemithorax and right axillary region. Overlapping cardiac leads. If there is further concern of the sequela of trauma additional workup with CT as clinically appropriate. IMPRESSION: Hyperinflation with chronic changes. No acute cardiopulmonary disease. Electronically Signed   By: Ranell Bring M.D.   On: 04/15/2024 12:20   EKG: Independently reviewed.  Sinus rhythm, rate 62, QTc 465.  No change from prior.  Assessment/Plan Principal Problem:   Hypertensive urgency Active Problems:   Multiple falls   Essential hypertension   Breast cancer of lower-inner quadrant of right female breast (HCC)   Assessment and Plan: * Hypertensive  urgency Blood pressure elevated to 225/67.  History of hypertension, but she is not on any medications at home.  Per outpatient notes-she used to be on amlodipine  5 mg. - IV labetalol 10 mg for systolic> 180.  She has received 2 doses, with improvement in blood pressure -Start amlodipine  5 mg daily -If persistent high blood pressure may need to transfer to the unit on drip.  Multiple falls Reports generalized weakness, chronic poor oral intake and dizziness. Lives alone.  MRI brain-small chronic infarcts, chronically advanced cerebellar atrophy, mild to moderate cerebral atrophy, small vessel ischemic disease.  Pelvic x-ray, CT head and neck, CT spine negative for acute abnormality, no large vessel occlusion -Obtain orthostatic vitals -PT eval - LR 75cc/hr x 20hrs  Breast cancer of lower-inner quadrant of right female breast (HCC) 2013.  Status post lumpectomy, radiation.  Currently not on medications.   DVT prophylaxis: Lovenox Code Status: DNR- Confirmed with patient at bedside Family Communication: None at bedside. I Called patients sonVeera Stapleton on the phone-he has been updated on patient's status and plan of care. Disposition Plan: ~ 2 days Consults called: None Admission status:  Obs tele   Author: Tully FORBES Carwin, MD 04/15/2024 8:19 PM  For on call review [email or web address].

## 2024-04-15 NOTE — ED Notes (Signed)
 Pt passed [facility] swallow screen

## 2024-04-15 NOTE — ED Notes (Signed)
 Pt ambulated around nurses station. Pt experienced sporadic bouts of dizziness.

## 2024-04-16 DIAGNOSIS — I160 Hypertensive urgency: Secondary | ICD-10-CM | POA: Diagnosis not present

## 2024-04-16 LAB — BASIC METABOLIC PANEL WITH GFR
Anion gap: 13 (ref 5–15)
BUN: 21 mg/dL (ref 8–23)
CO2: 25 mmol/L (ref 22–32)
Calcium: 9.1 mg/dL (ref 8.9–10.3)
Chloride: 103 mmol/L (ref 98–111)
Creatinine, Ser: 0.87 mg/dL (ref 0.44–1.00)
GFR, Estimated: >60 mL/min (ref >60)
Glucose, Bld: 73 mg/dL (ref 70–99)
Potassium: 4.1 mmol/L (ref 3.5–5.1)
Sodium: 141 mmol/L (ref 135–145)

## 2024-04-16 LAB — CBC
HCT: 33.1 % — ABNORMAL LOW (ref 36.0–46.0)
Hemoglobin: 10.8 g/dL — ABNORMAL LOW (ref 12.0–15.0)
MCH: 30.5 pg (ref 26.0–34.0)
MCHC: 32.6 g/dL (ref 30.0–36.0)
MCV: 93.5 fL (ref 80.0–100.0)
Platelets: 251 10*3/uL (ref 150–400)
RBC: 3.54 MIL/uL — ABNORMAL LOW (ref 3.87–5.11)
RDW: 12.4 % (ref 11.5–15.5)
WBC: 8.3 10*3/uL (ref 4.0–10.5)
nRBC: 0 % (ref 0.0–0.2)

## 2024-04-16 MED ORDER — AMLODIPINE BESYLATE 10 MG PO TABS: 10.0000 mg | ORAL_TABLET | Freq: Every day | ORAL | 0 refills | Status: DC

## 2024-04-16 NOTE — Care Management Obs Status (Signed)
 MEDICARE OBSERVATION STATUS NOTIFICATION   Patient Details  Name: Crystal [name] MRN: [id number] Date of Birth: 07-04-1946   Medicare Observation Status Notification Given:  Yes    Duwaine LITTIE Ada 04/16/2024, 11:04 AM

## 2024-04-16 NOTE — Plan of Care (Signed)

## 2024-04-16 NOTE — Evaluation (Signed)
 Occupational Therapy Evaluation Patient Details Name: Crystal [name] MRN: [id number] DOB: [birth year] Today's Date: 04/16/2024   History of Present Illness   Crystal [name] is a 78 y.o. female with medical history significant for hypertension, breast cancer, CVA, IBS, GERD.  Patient was brought to the ED with complaints of multiple falls, weakness.  Patient lives alone.  She ambulates without assistance or assistive devices.  She reports falls over the past few years, but recently falls have been more frequent.  She reports weakness, chronic poor oral intake, intermittent dizziness.  No vomiting or diarrhea.  No chest or abdominal pain.  No change in urinary habits. (per MD)     Clinical Impressions Pt agreeable to OT and PT co-evaluation. Pt reports living alone with PRN assist from son. Pt followed commands well seemingly WFL cognition later on when this therapist came back to the pt's room to discuss DME. Pt was confused about her location and purpose for being here. During the session pt reported dizziness needing CGA for ambulation with intermittent breaks. Assist needed mostly due to balance deficits related to dizziness. Pt left in the chair with chair alarm set and call bell within reach. Pt will benefit from continued OT in the hospital and recommended venue below to increase strength, balance, and endurance for safe ADL's.        If plan is discharge home, recommend the following:   A little help with walking and/or transfers;A little help with bathing/dressing/bathroom;Assistance with cooking/housework;Assist for transportation;Help with stairs or ramp for entrance     Functional Status Assessment   Patient has had a recent decline in their functional status and demonstrates the ability to make significant improvements in function in a reasonable and predictable amount of time.     Equipment Recommendations   Tub/shower seat              Precautions/Restrictions   Precautions Precautions: Fall Recall of Precautions/Restrictions: Intact Restrictions Weight Bearing Restrictions Per Provider Order: No     Mobility Bed Mobility Overal bed mobility: Independent                  Transfers Overall transfer level: Needs assistance   Transfers: Bed to chair/wheelchair/BSC, Sit to/from Stand Sit to Stand: Supervision, Contact guard assist Stand pivot transfers: Contact guard assist         General transfer comment: EOB to chair; pt reporting dizziness.      Balance Overall balance assessment: Needs assistance Sitting-balance support: No upper extremity supported, Feet supported Sitting balance-Leahy Scale: Good Sitting balance - Comments: seated at EOB   Standing balance support: No upper extremity supported, During functional activity Standing balance-Leahy Scale: Fair Standing balance comment: fair ; dizziness reported; breaks needed PRN                           ADL either performed or assessed with clinical judgement   ADL Overall ADL's : Needs assistance/impaired     Grooming: Contact guard assist;Standing   Upper Body Bathing: Set up;Sitting   Lower Body Bathing: Set up;Sitting/lateral leans   Upper Body Dressing : Set up;Sitting   Lower Body Dressing: Set up;Sitting/lateral leans   Toilet Transfer: Contact guard assist;Ambulation Toilet Transfer Details (indicate cue type and reason): Simulated via ambulation in the hall without AD. Toileting- Clothing Manipulation and Hygiene: Modified independent;Supervision/safety;Sitting/lateral lean       Functional mobility during ADLs: Contact guard assist General ADL Comments: Able  to ambualte over 100 feet in the hall without AD.     Vision Baseline Vision/History: 1 Wears glasses Ability to See in Adequate Light: 1 Impaired Patient Visual Report: No change from baseline Vision Assessment?: Wears glasses for reading;No  apparent visual deficits     Perception Perception: Not tested       Praxis Praxis: Not tested       Pertinent Vitals/Pain Pain Assessment Pain Assessment: No/denies pain     Extremity/Trunk Assessment Upper Extremity Assessment Upper Extremity Assessment: Generalized weakness   Lower Extremity Assessment Lower Extremity Assessment: Defer to PT evaluation   Cervical / Trunk Assessment Cervical / Trunk Assessment: Normal   Communication Communication Communication: No apparent difficulties   Cognition Arousal: Alert Behavior During Therapy: WFL for tasks assessed/performed Cognition: No family/caregiver present to determine baseline             OT - Cognition Comments: Pt appeared confused about where she was towards the end of the session. Pt seemed to think she was at an appointment but then was able to state she was in the hospital.                 Following commands: Intact       Cueing  General Comments   Cueing Techniques: Verbal cues                 Home Living Family/patient expects to be discharged to:: Private residence Living Arrangements: Alone Available Help at Discharge: Family;Available PRN/intermittently Type of Home: House Home Access: Stairs to enter Entrance Stairs-Number of Steps: 1   Home Layout: Two level Alternate Level Stairs-Number of Steps: 11 Alternate Level Stairs-Rails: Right (going up) Bathroom Shower/Tub: Tub/shower unit;Walk-in shower   Bathroom Toilet: Standard Bathroom Accessibility: Yes How Accessible: Accessible via wheelchair;Accessible via walker Home Equipment: Cane - single point          Prior Functioning/Environment Prior Level of Function : Independent/Modified Independent;History of Falls (last six months) (6 falls in 6 months)             Mobility Comments: Community ambulator without AD; drives ADLs Comments: Independent    OT Problem List: Decreased strength;Impaired balance  (sitting and/or standing)   OT Treatment/Interventions: Self-care/ADL training;Therapeutic exercise;DME and/or AE instruction;Therapeutic activities;Patient/family education;Balance training;Cognitive remediation/compensation      OT Goals(Current goals can be found in the care plan section)   Acute Rehab OT Goals Patient Stated Goal: return home OT Goal Formulation: With patient Time For Goal Achievement: 04/30/24 Potential to Achieve Goals: Good   OT Frequency:  Min 1X/week    Co-evaluation PT/OT/SLP Co-Evaluation/Treatment: Yes Reason for Co-Treatment: To address functional/ADL transfers   OT goals addressed during session: ADL's and self-care      AM-PAC OT 6 Clicks Daily Activity     Outcome Measure Help from another person eating meals?: None Help from another person taking care of personal grooming?: A Little Help from another person toileting, which includes using toliet, bedpan, or urinal?: A Little Help from another person bathing (including washing, rinsing, drying)?: A Little Help from another person to put on and taking off regular upper body clothing?: A Little Help from another person to put on and taking off regular lower body clothing?: A Little 6 Click Score: 19   End of Session Equipment Utilized During Treatment: Gait belt Nurse Communication: Other (comment) (repoted plan to put in chair alarm and that the pt was confused)  Activity Tolerance: Patient tolerated treatment well Patient  left: in chair;with call bell/phone within reach;with chair alarm set  OT Visit Diagnosis: Unsteadiness on feet (R26.81);Other abnormalities of gait and mobility (R26.89);Muscle weakness (generalized) (M62.81)                Time: 9154-9098 OT Time Calculation (min): 16 min Charges:  OT General Charges $OT Visit: 1 Visit OT Evaluation $OT Eval Low Complexity: 1 Low  Crystal [name] OT, MOT   Jayson Person 04/16/2024, 9:41 AM

## 2024-04-16 NOTE — TOC Transition Note (Signed)
 Transition of Care [facility]) - Discharge Note   Patient Details  Name: Crystal [name] MRN: [id number] Date of Birth: [birth year]  [facility] CM/SW Contact:  Crystal [name], LCSWA Phone Number: 04/16/2024, 11:28 AM   Clinical Narrative:    CSW spoke with pts son to complete assessment. Pt lives alone and is independent in completing her ADLs and drives when needed. Pt has not had HH but they are agreeable with no agency preference. CSW spoke to Crystal [name] with Crystal [name] they will review and follow up with pt and family. TOC signing off.   Final next level of care: Home w Home Health Services Barriers to Discharge: Barriers Resolved   Patient Goals and CMS Choice Patient states their goals for this hospitalization and ongoing recovery are:: return home CMS Medicare.gov Compare Post Acute Care list provided to:: Patient Represenative (must comment) Choice offered to / list presented to : Adult Children      Discharge Placement                       Discharge Plan and Services Additional resources added to the After Visit Summary for                            [facility] PT, OT [facility] Agency: [facility] Date [facility] Agency Contacted: 04/16/24   Representative spoke with at [facility] Agency: Crystal [name]  Social Drivers of Health (SDOH) Interventions SDOH Screenings   Food Insecurity: No Food Insecurity (04/16/2024)  Housing: Patient Unable To Answer (04/16/2024)  Transportation Needs: No Transportation Needs (04/16/2024)  Utilities: Not At Risk (04/16/2024)  Depression (PHQ2-9): Low Risk  (02/09/2020)  Financial Resource Strain: Low Risk  (05/22/2018)  Physical Activity: Inactive (05/22/2018)  Social Connections: Moderately Isolated (04/16/2024)  Stress: No Stress Concern Present (05/22/2018)  Tobacco Use: Medium Risk (04/15/2024)     Readmission Risk Interventions     No data to display

## 2024-04-16 NOTE — Discharge Summary (Signed)
 Physician Discharge Summary  Crystal [name] FMW:[id number] DOB: 12-Dec-1945 DOA: 04/15/2024  PCP: Patient, No Pcp Per  Admit date: 04/15/2024 Discharge date: 04/16/2024    Admitted From: Home Disposition: Home Home  Recommendations for Outpatient Follow-up:  Follow up with PCP in 1-2 weeks Please obtain BMP/CBC in one week Please follow up with your PCP on the following pending results: Unresulted Labs (From admission, onward)    None         Home Health: Yes Equipment/Devices: Shower seat  Discharge Condition: Stable CODE STATUS: DNR Diet recommendation: Cardiac  Due to brief hospitalization, I have copied admitting hospitalist HPI and ED course as below. HPI: Crystal [name] is a 78 y.o. female with medical history significant for hypertension, breast cancer, CVA, IBS, GERD. Patient was brought to the ED with complaints of multiple falls, weakness.  Patient lives alone.  She ambulates without assistance or assistive devices.  She reports falls over the past few years, but recently falls have been more frequent.  She reports weakness, chronic poor oral intake, intermittent dizziness.  No vomiting or diarrhea.  No chest or abdominal pain.  No change in urinary habits.   ED Course: Temperature 97.6.  Heart rate 60s to 77.  Respiratory rate 11-28.  Elevated blood pressure systolic up to 225/67. Brain MRI-no acute abnormality.  Progressive mild chronic small vessel ischemic disease, small chronic infarcts. CTA head and neck-no acute cervical spine fracture or traumatic subluxation. CT spine-negative for acute abnormality. Portable chest and pelvic x-ray-hyperinflation with no chronic changes. IV Labetalol 10 mg x 1 given with improvement in blood pressure. Hospitalist to admit for falls, elevated blood pressure.  Subjective: Seen and examined this morning, patient was fully alert and oriented with no complaints at all.  Brief/Interim Summary: Patient was brought into the  ED due to multiple falls however she was also found to have hypertensive urgency.  Apparently she was supposed to be taking amlodipine  but she was not taking.  She was started on amlodipine  5 mg as well as as needed labetalol.  Her blood pressure has improved but still elevated.  She was assessed by PT OT for her frequent falls but they recommended home health.  Social worker discussed with the family and they are in agreement with taking her home today.  She is otherwise stable.  At discharge, I am prescribing her amlodipine  10 mg p.o. daily.  Her recent blood pressure is 167/52.  Prior to that it was 144/58.  Although it appears that her hemoglobin dropped from 13 yesterday to 10.8 but there is no history of hematemesis, hematochezia or melena.  This is likely hemodilution.  Repeat CBC at PCPs office in a week.  Discharge Diagnoses:  Principal Problem:   Hypertensive urgency Active Problems:   Multiple falls   Essential hypertension   Breast cancer of lower-inner quadrant of right female breast [facility])    Discharge Instructions   Allergies as of 04/16/2024   No Known Allergies      Medication List     TAKE these medications    amLODipine  10 MG tablet Commonly known as: NORVASC  Take 1 tablet (10 mg total) by mouth daily.   aspirin 81 MG tablet Take 81 mg by mouth daily.   polyethylene glycol 17 g packet Commonly known as: MIRALAX / GLYCOLAX Take 17 g by mouth 3 (three) times a week. Reported on 04/02/2016        Follow-up Information     PCP Follow up in  1 week(s).                 No Known Allergies  Consultations: None   Procedures/Studies: CT C-SPINE NO CHARGE Result Date: 04/15/2024 CLINICAL DATA:  Fall.  Weakness. EXAM: CT CERVICAL SPINE WITH CONTRAST TECHNIQUE: Multiplanar CT images of the cervical spine were reconstructed from contemporary CTA of the neck. RADIATION DOSE REDUCTION: This exam was performed according to the departmental dose-optimization  program which includes automated exposure control, adjustment of the mA and/or kV according to patient size and/or use of iterative reconstruction technique. CONTRAST:  No additional. COMPARISON:  Cervical spine MRI 03/31/2015 FINDINGS: Alignment: Mild reversal of the normal cervical lordosis. No evidence of acute traumatic subluxation. Skull base and vertebrae: No acute fracture or suspicious lesion. Soft tissues and spinal canal: No prevertebral fluid or swelling. No visible canal hematoma. Disc levels: Disc degeneration from C4-5 through C6-7, including severe disc space narrowing and degenerative endplate changes at C4-5. At least mild spinal stenosis from C4-5 through C6-7. Uncovertebral spurring results in moderate left neural foraminal stenosis at C4-5 and C5-6 and severe bilateral neural foraminal stenosis at C6-7. Upper chest: Right greater than left pleuroparenchymal apical lung scarring. Air-filled, patulous esophagus. Other: None. IMPRESSION: No acute cervical spine fracture or traumatic subluxation. Electronically Signed   By: Dasie Hamburg M.D.   On: 04/15/2024 14:27   CT ANGIO HEAD NECK W WO CM Result Date: 04/15/2024 CLINICAL DATA:  Neuro deficit, acute, stroke suspected. Fall. Weakness. EXAM: CT ANGIOGRAPHY HEAD AND NECK WITH AND WITHOUT CONTRAST TECHNIQUE: Multidetector CT imaging of the head and neck was performed using the standard protocol during bolus administration of intravenous contrast. Multiplanar CT image reconstructions and MIPs were obtained to evaluate the vascular anatomy. Carotid stenosis measurements (when applicable) are obtained utilizing NASCET criteria, using the distal internal carotid diameter as the denominator. RADIATION DOSE REDUCTION: This exam was performed according to the departmental dose-optimization program which includes automated exposure control, adjustment of the mA and/or kV according to patient size and/or use of iterative reconstruction technique. CONTRAST:   75mL OMNIPAQUE  IOHEXOL  350 MG/ML SOLN COMPARISON:  Head MRI 04/15/2024 and MRA 12/18/2002 FINDINGS: CT HEAD FINDINGS Brain: There is no evidence of an acute infarct, intracranial hemorrhage, mass, midline shift, or extra-axial fluid collection. Mild-to-moderate cerebral atrophy and chronically advanced cerebellar atrophy are again noted. Cerebral white matter hypodensities are nonspecific but compatible with mild chronic small vessel ischemic disease. Vascular: Calcified atherosclerosis at the skull base. Skull: No fracture or suspicious lesion. Sinuses/Orbits: Mild mucosal thickening and secretions in the right maxillary sinus. Clear mastoid air cells. Bilateral cataract extraction. Other: None. Review of the MIP images confirms the above findings CTA NECK FINDINGS Aortic arch: Standard branching with moderate atherosclerotic calcification. Patent brachiocephalic and subclavian arteries with calcified and soft plaque in the proximal left subclavian artery not resulting in significant stenosis. Right carotid system: Patent without evidence stenosis, dissection, or significant atherosclerosis. Left carotid system: Patent with a small amount of calcified plaque at the carotid bifurcation. No evidence of a significant stenosis or dissection. Vertebral arteries: Patent without evidence of stenosis, dissection, or significant atherosclerosis. Mildly dominant right vertebral artery. Skeleton: Cervical spine reported separately.  No suspicious lesion. Other neck: No evidence of cervical lymphadenopathy or mass. Upper chest: Pleuroparenchymal scarring in the lung apices, right greater than left. Air-filled, patulous esophagus. Review of the MIP images confirms the above findings CTA HEAD FINDINGS Anterior circulation: The internal carotid arteries are patent from skull base to carotid termini  with atherosclerosis resulting in up to mild bilateral paraclinoid stenoses. There is a 2 mm protrusion from the left supraclinoid  ICA in the posterior communicating region without a visible associated vessel. This was likely present on the 2004 MRA although artifact on that study limits evaluation. ACAs and MCAs are patent without evidence of a proximal branch occlusion or significant proximal stenosis. Posterior circulation: The intracranial vertebral arteries are patent to the basilar with calcified plaque in the proximal left V4 segment resulting in up to mild stenosis. Patent bilateral PICA, right AICA, and bilateral SCA origins are visualized. The basilar artery is widely patent. There are moderately large right and diminutive or absent left posterior communicating arteries. The PCAs are patent with new widespread atherosclerosis including severe P1 and P2 stenoses bilaterally. No aneurysm is identified. Venous sinuses: Patent. Anatomic variants: None. Review of the MIP images confirms the above findings IMPRESSION: 1. No evidence of acute intracranial abnormality. 2. Mild chronic small vessel ischemic disease. Cerebellar greater than cerebral atrophy. 3. No large vessel occlusion. 4. Intracranial atherosclerosis including severe bilateral PCA stenoses. 5. No hemodynamically significant stenosis in the neck. 6. 2 mm left supraclinoid ICA infundibulum versus aneurysm. 7.  Aortic Atherosclerosis (ICD10-I70.0). Electronically Signed   By: Dasie Hamburg M.D.   On: 04/15/2024 14:22   MR BRAIN WO CONTRAST Result Date: 04/15/2024 CLINICAL DATA:  Falls.  Arm weakness.  Dizziness. EXAM: MRI HEAD WITHOUT CONTRAST TECHNIQUE: Multiplanar, multiecho pulse sequences of the brain and surrounding structures were obtained without intravenous contrast. COMPARISON:  Head MRI 11/14/2012 FINDINGS: Brain: There is no evidence of an acute infarct, intracranial hemorrhage, mass, midline shift, or extra-axial fluid collection. Scattered T2 hyperintensities in the cerebral white matter bilaterally have progressed and are nonspecific but compatible with mild  chronic small vessel ischemic disease. A small chronic cortical infarct laterally in the left parietal lobe is new, as is a chronic lacunar infarct in the right subinsular white matter. Chronically advanced cerebellar atrophy has mildly progressed. There is progressive, mild-to-moderate cerebral atrophy. Vascular: Major intracranial vascular flow voids are preserved. Skull and upper cervical spine: No suspicious marrow lesion. Disc degeneration at C4-5. Sinuses/Orbits: Bilateral cataract extraction. Mild mucosal thickening and small volume secretions in the right maxillary sinus. Clear mastoid air cells. Other: None. IMPRESSION: 1. No acute intracranial abnormality. 2. Progressive, mild chronic small vessel ischemic disease with small chronic infarcts as above. 3. Chronically advanced cerebellar atrophy. Mild-to-moderate cerebral atrophy. Electronically Signed   By: Dasie Hamburg M.D.   On: 04/15/2024 13:48   DG Pelvis Portable Result Date: 04/15/2024 CLINICAL DATA:  Fall. EXAM: PORTABLE PELVIS 1-2 VIEWS COMPARISON:  April 17, 2018. FINDINGS: There is no evidence of pelvic fracture or diastasis. No pelvic bone lesions are seen. IMPRESSION: Negative. Electronically Signed   By: Lynwood Landy Raddle M.D.   On: 04/15/2024 12:22   DG Chest Portable 1 View Result Date: 04/15/2024 CLINICAL DATA:  Fall.  Weakness EXAM: PORTABLE CHEST 1 VIEW COMPARISON:  Chest x-ray 03/03/2012 FINDINGS: Hyperinflation. No consolidation, pneumothorax or effusion. No edema. Normal cardiopericardial silhouette. Calcified aorta. Calcifications along the tracheobronchial tree. Surgical clips in the upper mediastinum. Surgical clips are along the right hemithorax and right axillary region. Overlapping cardiac leads. If there is further concern of the sequela of trauma additional workup with CT as clinically appropriate. IMPRESSION: Hyperinflation with chronic changes. No acute cardiopulmonary disease. Electronically Signed   By: Ranell Bring M.D.    On: 04/15/2024 12:20     Discharge Exam: Vitals:  04/16/24 0403 04/16/24 0825  BP: (!) 144/58 (!) 167/52  Pulse: 69   Resp: 18   Temp: 99.1 F (37.3 C)   SpO2: 98%    Vitals:   04/15/24 2306 04/16/24 0000 04/16/24 0403 04/16/24 0825  BP: (!) 182/61 (!) 135/50 (!) 144/58 (!) 167/52  Pulse: 64 61 69   Resp: (!) 21 16 18    Temp: 98.6 F (37 C) 98.4 F (36.9 C) 99.1 F (37.3 C)   TempSrc: Oral Oral Oral   SpO2: 100% 100% 98%   Weight:      Height:        General: Pt is alert, awake, not in acute distress Cardiovascular: RRR, S1/S2 +, no rubs, no gallops Respiratory: CTA bilaterally, no wheezing, no rhonchi Abdominal: Soft, NT, ND, bowel sounds + Extremities: no edema, no cyanosis    The results of significant diagnostics from this hospitalization (including imaging, microbiology, ancillary and laboratory) are listed below for reference.     Microbiology: No results found for this or any previous visit (from the past 240 hours).   Labs: BNP (last 3 results) No results for input(s): BNP in the last 8760 hours. Basic Metabolic Panel: Recent Labs  Lab 04/15/24 1123 04/16/24 0359  NA 141 141  K 3.5 4.1  CL 100 103  CO2 29 25  GLUCOSE 105* 73  BUN 20 21  CREATININE 1.01* 0.87  CALCIUM  9.8 9.1   Liver Function Tests: Recent Labs  Lab 04/15/24 1123  AST 26  ALT 13  ALKPHOS 84  BILITOT 2.1*  PROT 7.4  ALBUMIN 4.1   No results for input(s): LIPASE, AMYLASE in the last 168 hours. No results for input(s): AMMONIA in the last 168 hours. CBC: Recent Labs  Lab 04/15/24 1123 04/16/24 0359  WBC 8.1 8.3  NEUTROABS 6.9  --   HGB 13.0 10.8*  HCT 38.2 33.1*  MCV 93.4 93.5  PLT 268 251   Cardiac Enzymes: Recent Labs  Lab 04/15/24 1123  CKTOTAL 124   BNP: Invalid input(s): POCBNP CBG: No results for input(s): GLUCAP in the last 168 hours. D-Dimer No results for input(s): DDIMER in the last 72 hours. Hgb A1c No results for  input(s): HGBA1C in the last 72 hours. Lipid Profile No results for input(s): CHOL, HDL, LDLCALC, TRIG, CHOLHDL, LDLDIRECT in the last 72 hours. Thyroid  function studies No results for input(s): TSH, T4TOTAL, T3FREE, THYROIDAB in the last 72 hours.  Invalid input(s): FREET3 Anemia work up No results for input(s): VITAMINB12, FOLATE, FERRITIN, TIBC, IRON, RETICCTPCT in the last 72 hours. Urinalysis    Component Value Date/Time   COLORURINE AMBER (A) 04/15/2024 1054   APPEARANCEUR CLEAR 04/15/2024 1054   APPEARANCEUR Clear 01/27/2018 0851   LABSPEC 1.030 04/15/2024 1054   PHURINE 5.0 04/15/2024 1054   GLUCOSEU NEGATIVE 04/15/2024 1054   GLUCOSEU NEGATIVE 01/27/2009 0833   HGBUR SMALL (A) 04/15/2024 1054   BILIRUBINUR NEGATIVE 04/15/2024 1054   BILIRUBINUR Negative 01/27/2018 0851   KETONESUR 5 (A) 04/15/2024 1054   PROTEINUR 100 (A) 04/15/2024 1054   UROBILINOGEN 0.2 03/28/2015 1042   NITRITE NEGATIVE 04/15/2024 1054   LEUKOCYTESUR NEGATIVE 04/15/2024 1054   Sepsis Labs Recent Labs  Lab 04/15/24 1123 04/16/24 0359  WBC 8.1 8.3   Microbiology No results found for this or any previous visit (from the past 240 hours).  FURTHER DISCHARGE INSTRUCTIONS:   Get Medicines reviewed and adjusted: Please take all your medications with you for your next visit with your Primary MD  Laboratory/radiological data: Please request your Primary MD to go over all hospital tests and procedure/radiological results at the follow up, please ask your Primary MD to get all Hospital records sent to his/her office.   In some cases, they will be blood work, cultures and biopsy results pending at the time of your discharge. Please request that your primary care M.D. goes through all the records of your hospital data and follows up on these results.   Also Note the following: If you experience worsening of your admission symptoms, develop shortness of breath, life  threatening emergency, suicidal or homicidal thoughts you must seek medical attention immediately by calling 911 or calling your MD immediately  if symptoms less severe.   You must read complete instructions/literature along with all the possible adverse reactions/side effects for all the Medicines you take and that have been prescribed to you. Take any new Medicines after you have completely understood and accpet all the possible adverse reactions/side effects.    patient was instructed, not to drive, operate heavy machinery, perform activities at heights, swimming or participation in water activities or provide baby-sitting services while on Pain, Sleep and Anxiety Medications; until their outpatient Physician has advised to do so again. Also recommended to not to take more than prescribed Pain, Sleep and Anxiety Medications.  It is not advisable to combine anxiety, sleep and pain medications without talking with your primary care provider.     Wear Seat belts while driving.   Please note: You were cared for by a hospitalist during your hospital stay. Once you are discharged, your primary care physician will handle any further medical issues. Please note that NO REFILLS for any discharge medications will be authorized once you are discharged, as it is imperative that you return to your primary care physician (or establish a relationship with a primary care physician if you do not have one) for your post hospital discharge needs so that they can reassess your need for medications and monitor your lab values  Time coordinating discharge: Over 30 minutes  SIGNED:   Fredia Skeeter, MD  [facility] 04/16/2024, 11:11 AM *Please note that this is a verbal dictation therefore any spelling or grammatical errors are due to the Dragon [employer] One system interpretation. If 7PM-7AM, please contact night-coverage [email or web address]

## 2024-04-16 NOTE — Plan of Care (Signed)
  Problem: Acute Rehab OT Goals (only OT should resolve) Goal: Pt. Will Perform Grooming Flowsheets (Taken 04/16/2024 0944) Pt Will Perform Grooming: Independently Goal: Pt. Will Perform Lower Body Dressing Flowsheets (Taken 04/16/2024 0944) Pt Will Perform Lower Body Dressing: Independently Goal: Pt. Will Transfer To Toilet Flowsheets (Taken 04/16/2024 [phone]) Pt Will Transfer to Toilet: Independently Goal: Pt. Will Perform Toileting-Clothing Manipulation Flowsheets (Taken 04/16/2024 0944) Pt Will Perform Toileting - Clothing Manipulation and hygiene: Independently Goal: Pt/Caregiver Will Perform Home Exercise Program Flowsheets (Taken 04/16/2024 [phone]) Pt/caregiver will Perform Home Exercise Program:  Increased strength  Both right and left upper extremity  Independently  Dishawn Bhargava OT, MOT

## 2024-04-16 NOTE — Evaluation (Signed)
 Physical Therapy Evaluation Patient Details Name: Crystal [name] MRN: [id number] DOB: 1946/04/02 Today's Date: 04/16/2024  History of Present Illness  Crystal [name] is a 78 y.o. female with medical history significant for hypertension, breast cancer, CVA, IBS, GERD.  Patient was brought to the ED with complaints of multiple falls, weakness.  Patient lives alone.  She ambulates without assistance or assistive devices.  She reports falls over the past few years, but recently falls have been more frequent.  She reports weakness, chronic poor oral intake, intermittent dizziness.  No vomiting or diarrhea.  No chest or abdominal pain.  No change in urinary habits.   Clinical Impression  Patient tends to drift left/right when not using an AD safer using RW with good return demonstrated for use without loss of balance. Patient tolerated sitting up in chair after therapy. PLAN:  Patient to be discharged home today and discharged from acute physical therapy to care of nursing for ambulation as tolerated for length of stay with recommendations stated below          If plan is discharge home, recommend the following: A little help with walking and/or transfers;A little help with bathing/dressing/bathroom;Help with stairs or ramp for entrance;Assistance with cooking/housework   Can travel by private Data processing manager (4 wheels)  Recommendations for Other Services       Functional Status Assessment Patient has had a recent decline in their functional status and demonstrates the ability to make significant improvements in function in a reasonable and predictable amount of time.     Precautions / Restrictions Precautions Precautions: Fall Recall of Precautions/Restrictions: Intact Restrictions Weight Bearing Restrictions Per Provider Order: No      Mobility  Bed Mobility Overal bed mobility: Independent                  Transfers Overall  transfer level: Needs assistance   Transfers: Bed to chair/wheelchair/BSC, Sit to/from Stand Sit to Stand: Supervision, Contact guard assist Stand pivot transfers: Contact guard assist         General transfer comment: slightly labored movement without loss of balance    Ambulation/Gait Ambulation/Gait assistance: Contact guard assist Gait Distance (Feet): 80 Feet Assistive device: None, Rolling walker (2 wheels) Gait Pattern/deviations: Decreased step length - right, Decreased step length - left, Decreased stride length, Drifts right/left Gait velocity: decreased     General Gait Details: labored movemet requiring verbal cueing to let arms swing with fair carryover, occasional drifting left/right  when walking without AD, safer using RW with fair/good carryover for use demonstrated  Stairs            Wheelchair Mobility     Tilt Bed    Modified Rankin (Stroke Patients Only)       Balance Overall balance assessment: Needs assistance Sitting-balance support: No upper extremity supported, Feet supported Sitting balance-Leahy Scale: Good Sitting balance - Comments: seated at EOB   Standing balance support: During functional activity, No upper extremity supported Standing balance-Leahy Scale: Fair Standing balance comment: fair ; dizziness reported; breaks needed PRN                             Pertinent Vitals/Pain Pain Assessment Pain Assessment: No/denies pain    Home Living Family/patient expects to be discharged to:: Private residence Living Arrangements: Alone Available Help at Discharge: Family;Available PRN/intermittently Type of Home: House Home Access: Stairs to  enter   Entrance Stairs-Number of Steps: 1 Alternate Level Stairs-Number of Steps: 11 Home Layout: Two level Home Equipment: Cane - single point      Prior Function Prior Level of Function : Independent/Modified Independent;History of Falls (last six months)              Mobility Comments: Community ambulator without AD; drives ADLs Comments: Independent     Extremity/Trunk Assessment   Upper Extremity Assessment Upper Extremity Assessment: Defer to OT evaluation    Lower Extremity Assessment Lower Extremity Assessment: Generalized weakness    Cervical / Trunk Assessment Cervical / Trunk Assessment: Normal  Communication   Communication Communication: No apparent difficulties    Cognition Arousal: Alert Behavior During Therapy: WFL for tasks assessed/performed   PT - Cognitive impairments: No family/caregiver present to determine baseline                         Following commands: Intact       Cueing Cueing Techniques: Verbal cues     General Comments      Exercises     Assessment/Plan    PT Assessment All further PT needs can be met in the next venue of care  PT Problem List Decreased activity tolerance;Decreased balance;Decreased mobility;Decreased strength       PT Treatment Interventions      PT Goals (Current goals can be found in the Care Plan section)  Acute Rehab PT Goals Patient Stated Goal: return home with family to assist PT Goal Formulation: With patient Time For Goal Achievement: 04/16/24 Potential to Achieve Goals: Good    Frequency       Co-evaluation PT/OT/SLP Co-Evaluation/Treatment: Yes Reason for Co-Treatment: To address functional/ADL transfers PT goals addressed during session: Mobility/safety with mobility;Balance;Proper use of DME OT goals addressed during session: ADL's and self-care       AM-PAC PT 6 Clicks Mobility  Outcome Measure Help needed turning from your back to your side while in a flat bed without using bedrails?: None Help needed moving from lying on your back to sitting on the side of a flat bed without using bedrails?: None Help needed moving to and from a bed to a chair (including a wheelchair)?: A Little Help needed standing up from a chair using your arms  (e.g., wheelchair or bedside chair)?: A Little Help needed to walk in hospital room?: A Little Help needed climbing 3-5 steps with a railing? : A Little 6 Click Score: 20    End of Session   Activity Tolerance: Patient tolerated treatment well Patient left: in chair;with call bell/phone within reach Nurse Communication: Mobility status PT Visit Diagnosis: Unsteadiness on feet (R26.81);Other abnormalities of gait and mobility (R26.89);Muscle weakness (generalized) (M62.81)    Time: 9156-9141 PT Time Calculation (min) (ACUTE ONLY): 15 min   Charges:   PT Evaluation $PT Eval Low Complexity: 1 Low PT Treatments $Gait Training: 8-22 mins PT General Charges $$ ACUTE PT VISIT: 1 Visit         12:05 PM, 04/16/24 Lynwood Music, MPT Physical Therapist with [facility] [phone] office [phone] mobile phone

## 2024-04-17 ENCOUNTER — Emergency Department (HOSPITAL_COMMUNITY)

## 2024-04-17 ENCOUNTER — Encounter (HOSPITAL_COMMUNITY): Payer: Self-pay

## 2024-04-17 ENCOUNTER — Other Ambulatory Visit: Payer: Self-pay

## 2024-04-17 ENCOUNTER — Emergency Department (HOSPITAL_COMMUNITY)
Admission: EM | Admit: 2024-04-17 | Discharge: 2024-04-20 | Disposition: A | Discharge status: Skilled Nursing Facility | Attending: Student | Admitting: Student

## 2024-04-17 DIAGNOSIS — R2681 Unsteadiness on feet: Secondary | ICD-10-CM | POA: Insufficient documentation

## 2024-04-17 DIAGNOSIS — M6281 Muscle weakness (generalized): Secondary | ICD-10-CM | POA: Insufficient documentation

## 2024-04-17 DIAGNOSIS — S0001XA Abrasion of scalp, initial encounter: Secondary | ICD-10-CM | POA: Insufficient documentation

## 2024-04-17 DIAGNOSIS — W19XXXA Unspecified fall, initial encounter: Secondary | ICD-10-CM

## 2024-04-17 DIAGNOSIS — W108XXA Fall (on) (from) other stairs and steps, initial encounter: Secondary | ICD-10-CM | POA: Diagnosis not present

## 2024-04-17 DIAGNOSIS — R2689 Other abnormalities of gait and mobility: Secondary | ICD-10-CM | POA: Insufficient documentation

## 2024-04-17 DIAGNOSIS — F03C0 Unspecified dementia, severe, without behavioral disturbance, psychotic disturbance, mood disturbance, and anxiety: Secondary | ICD-10-CM | POA: Diagnosis not present

## 2024-04-17 DIAGNOSIS — I10 Essential (primary) hypertension: Secondary | ICD-10-CM | POA: Diagnosis not present

## 2024-04-17 DIAGNOSIS — R4182 Altered mental status, unspecified: Secondary | ICD-10-CM | POA: Diagnosis present

## 2024-04-17 LAB — URINALYSIS, ROUTINE W REFLEX MICROSCOPIC
Bilirubin Urine: NEGATIVE
Glucose, UA: NEGATIVE mg/dL
Hgb urine dipstick: NEGATIVE
Ketones, ur: NEGATIVE mg/dL
Leukocytes,Ua: NEGATIVE
Nitrite: NEGATIVE
Protein, ur: 30 mg/dL — AB
Specific Gravity, Urine: 1.031 — ABNORMAL HIGH (ref 1.005–1.030)
pH: 5 (ref 5.0–8.0)

## 2024-04-17 LAB — COMPREHENSIVE METABOLIC PANEL WITH GFR
ALT: 14 U/L (ref 0–44)
AST: 29 U/L (ref 15–41)
Albumin: 3.5 g/dL (ref 3.5–5.0)
Alkaline Phosphatase: 65 U/L (ref 38–126)
Anion gap: 10 (ref 5–15)
BUN: 11 mg/dL (ref 8–23)
CO2: 27 mmol/L (ref 22–32)
Calcium: 9.3 mg/dL (ref 8.9–10.3)
Chloride: 101 mmol/L (ref 98–111)
Creatinine, Ser: 0.99 mg/dL (ref 0.44–1.00)
GFR, Estimated: 58 mL/min — ABNORMAL LOW (ref >60)
Glucose, Bld: 119 mg/dL — ABNORMAL HIGH (ref 70–99)
Potassium: 3.6 mmol/L (ref 3.5–5.1)
Sodium: 138 mmol/L (ref 135–145)
Total Bilirubin: 1.5 mg/dL — ABNORMAL HIGH (ref 0.0–1.2)
Total Protein: 6.3 g/dL — ABNORMAL LOW (ref 6.5–8.1)

## 2024-04-17 LAB — CBC
HCT: 33.4 % — ABNORMAL LOW (ref 36.0–46.0)
Hemoglobin: 11 g/dL — ABNORMAL LOW (ref 12.0–15.0)
MCH: 30.7 pg (ref 26.0–34.0)
MCHC: 32.9 g/dL (ref 30.0–36.0)
MCV: 93.3 fL (ref 80.0–100.0)
Platelets: 298 10*3/uL (ref 150–400)
RBC: 3.58 MIL/uL — ABNORMAL LOW (ref 3.87–5.11)
RDW: 12.9 % (ref 11.5–15.5)
WBC: 10.9 10*3/uL — ABNORMAL HIGH (ref 4.0–10.5)
nRBC: 0 % (ref 0.0–0.2)

## 2024-04-17 LAB — CBG MONITORING, ED: Glucose-Capillary: 124 mg/dL — ABNORMAL HIGH (ref 70–99)

## 2024-04-17 MED ORDER — ACETAMINOPHEN 325 MG PO TABS: 650.0000 mg | ORAL_TABLET | ORAL | Status: DC | PRN

## 2024-04-17 MED ORDER — LACTATED RINGERS IV BOLUS
500.0000 mL | Freq: Once | INTRAVENOUS | Status: AC
  Administered 2024-04-17: 500 mL via INTRAVENOUS

## 2024-04-17 MED ORDER — LIDOCAINE-EPINEPHRINE-TETRACAINE (LET) TOPICAL GEL
3.0000 mL | Freq: Once | TOPICAL | Status: AC
  Administered 2024-04-17: 3 mL via TOPICAL
  Filled 2024-04-17: qty 3

## 2024-04-17 NOTE — ED Triage Notes (Addendum)
 Patient BIB family after unwitnessed fall at home, family reports that she fell upstairs as they found blood on her recliner upstairs then went down stairs and was found on the floor downstairs, unsure how many times she fell. Patient has had increased memory problems over the past week and increased falls. Patient has bloody spot on the back of her head, bleeding is controlled. Patient is A&Ox2 but this is a new baseline per family, undiagnosed dementia with recent decline.

## 2024-04-17 NOTE — ED Notes (Signed)
 Rec'ed patient no acute distress noted vss pt. Ate dinner tray, ambulated to bathroom with assistance. Is resting at this time no acute distress noted. Safety and comfort maintained call bell in reach will monitor.

## 2024-04-17 NOTE — TOC Initial Note (Signed)
 Transition of Care [facility]) - Initial/Assessment Note    Patient Details  Name: Crystal [name] MRN: [id number] Date of Birth: [birth year]  Transition of Care [facility] CM/SW Contact:    Hartley KATHEE Robertson, LCSWA Phone Number: 04/17/2024, 7:47 PM  Clinical Narrative:                  CSW spoke with pt's son Marcey, he states prior to this ED visit pt was living alone and mostly independent, he states pt unable to return home at this time as she is unsafe and can't remember anything and continues to fall, he states he feels as though she needs 24 hour care. CSW spoke about SNF consult, Marcey states they have been working with admissions staff at [facility] [place] and they are able to accept pt but would need an FL2 (FL2 started). Marcey states the family is prepared the pay out of pocket at pt does not have LTC Medicaid at this time and may not qualify currently. TOC will continue to follow and await PT results as well as speaking with admissions staff at [facility].   Expected Discharge Plan: Skilled Nursing Facility Barriers to Discharge: Continued Medical Work up, SNF Pending bed offer   Patient Goals and CMS Choice            Expected Discharge Plan and Services                                              Prior Living Arrangements/Services                       Activities of Daily Living      Permission Sought/Granted                  Emotional Assessment              Admission diagnosis:  fall head injury Patient Active Problem List   Diagnosis Date Noted   Multiple falls 04/15/2024   Hypertensive urgency 04/15/2024   Vitamin D  deficiency 10/11/2015   Other abnormal glucose 10/11/2015   Medication management 10/11/2015   Osteoporosis 03/28/2015   Breast cancer of lower-inner quadrant of right female breast (HCC) 07/20/2013   Venous (peripheral) insufficiency 07/18/2010   Peripheral vascular disease (HCC) 06/17/2010    Cerebrovascular disease 06/16/2010   Transient cerebral ischemia 03/02/2010   STENOSIS, RECTAL 02/16/2009   Irritable bowel syndrome 01/12/2009   Hyperlipidemia 05/10/2007   Essential hypertension 05/10/2007   GERD 05/10/2007   PCP:  Patient, No Pcp Per Pharmacy:   [id number] - [place], [place] - [address] [place] [address] Phone: [phone] Fax: [phone]     Social Drivers of Health (SDOH) Social History: SDOH Screenings   Food Insecurity: No Food Insecurity (04/16/2024)  Housing: Patient Unable To Answer (04/16/2024)  Transportation Needs: No Transportation Needs (04/16/2024)  Utilities: Not At Risk (04/16/2024)  Depression (PHQ2-9): Low Risk  (02/09/2020)  Financial Resource Strain: Low Risk  (05/22/2018)  Physical Activity: Inactive (05/22/2018)  Social Connections: Moderately Isolated (04/16/2024)  Stress: No Stress Concern Present (05/22/2018)  Tobacco Use: Medium Risk (04/17/2024)   SDOH Interventions:     Readmission Risk Interventions     No data to display

## 2024-04-17 NOTE — ED Notes (Signed)
 Report given to Olam, Charity fundraiser.  To be transferred to yellow.

## 2024-04-17 NOTE — ED Provider Notes (Signed)
 [facility] Provider Note   CSN: [id number] Arrival date & time: 04/17/24  1414     History Chief Complaint  Patient presents with   Altered Mental Status   Fall    Crystal [name] is a 78 y.o. female w/ PMHx hypertension, peripheral vascular disease, GERD, frequent falls who presents to the ED for evaluation of fall.  Patient said multiple falls in the past few days.  Concern patient fell down steps.  Laceration noted back of head.  Unsure if patient fell upstairs as well as down the stairs.  Family concerned about frequent falls and inability to complete ADLs.  Family would like assistance with placement.  Family states they have been trying to work on placement however she does not have a primary care physician. No anticoagulation.   Physical Exam Updated Vital Signs BP 110/80   Pulse 74   Temp 97.8 F (36.6 C)   Resp 16   Ht 5' 3 (1.6 m)   Wt 36.3 kg   SpO2 97%   BMI 14.17 kg/m  Physical Exam Vitals and nursing note reviewed.  Constitutional:      General: She is not in acute distress.    Appearance: She is well-developed.  HENT:     Head: Normocephalic.     Comments: Dried blood to occipital scalp.  Small abrasion after cleaning.  Hemostatic.  Eyes:     Conjunctiva/sclera: Conjunctivae normal.    Cardiovascular:     Rate and Rhythm: Normal rate and regular rhythm.     Heart sounds: No murmur heard. Pulmonary:     Effort: Pulmonary effort is normal. No respiratory distress.     Breath sounds: Normal breath sounds.  Abdominal:     Palpations: Abdomen is soft.     Tenderness: There is no abdominal tenderness.   Musculoskeletal:        General: No swelling.     Cervical back: Neck supple.   Skin:    General: Skin is warm and dry.     Capillary Refill: Capillary refill takes less than 2 seconds.   Neurological:     Mental Status: She is alert. She is disoriented.   Psychiatric:        Mood and Affect:  Mood normal.     ED Results / Procedures / Treatments   Labs (all labs ordered are listed, but only abnormal results are displayed) Labs Reviewed  COMPREHENSIVE METABOLIC PANEL WITH GFR - Abnormal; Notable for the following components:      Result Value   Glucose, Bld 119 (*)    Total Protein 6.3 (*)    Total Bilirubin 1.5 (*)    GFR, Estimated 58 (*)    All other components within normal limits  CBC - Abnormal; Notable for the following components:   WBC 10.9 (*)    RBC 3.58 (*)    Hemoglobin 11.0 (*)    HCT 33.4 (*)    All other components within normal limits  URINALYSIS, ROUTINE W REFLEX MICROSCOPIC - Abnormal; Notable for the following components:   Color, Urine AMBER (*)    APPearance HAZY (*)    Specific Gravity, Urine 1.031 (*)    Protein, ur 30 (*)    Bacteria, UA RARE (*)    All other components within normal limits  CBG MONITORING, ED - Abnormal; Notable for the following components:   Glucose-Capillary 124 (*)    All other components within normal limits  EKG EKG Interpretation Date/Time:  Friday April 17 2024 15:23:05 EDT Ventricular Rate:  76 PR Interval:  162 QRS Duration:  68 QT Interval:  418 QTC Calculation: 470 R Axis:   -9  Text Interpretation: Normal sinus rhythm Possible Left atrial enlargement Borderline ECG When compared with ECG of 15-Apr-2024 10:43, PREVIOUS ECG IS PRESENT Confirmed by Kommor, Madison ([id number]) on 04/17/2024 6:49:40 PM  Radiology CT Cervical Spine Wo Contrast Result Date: 04/17/2024 CLINICAL DATA:  Polytrauma, blunt Unwitnessed fall at home. EXAM: CT CERVICAL SPINE WITHOUT CONTRAST TECHNIQUE: Multidetector CT imaging of the cervical spine was performed without intravenous contrast. Multiplanar CT image reconstructions were also generated. RADIATION DOSE REDUCTION: This exam was performed according to the departmental dose-optimization program which includes automated exposure control, adjustment of the mA and/or kV according to  patient size and/or use of iterative reconstruction technique. COMPARISON:  CT cervical spine 2 days ago 04/15/2024 FINDINGS: Alignment: Unchanged reversal of normal lordosis. No traumatic subluxation. Skull base and vertebrae: No acute fracture. Vertebral body heights are maintained. The dens and skull base are intact. Soft tissues and spinal canal: No prevertebral fluid or swelling. No visible canal hematoma. Disc levels: Unchanged appearance of multilevel degenerative disc disease and facet hypertrophy. Upper chest: Right greater than left apical pleuroparenchymal scarring. Patulous included esophagus containing intraluminal debris. Other: Carotid calcifications. IMPRESSION: 1. No acute fracture or subluxation of the cervical spine. 2. Unchanged multilevel degenerative disc disease and facet hypertrophy. Electronically Signed   By: Andrea Gasman M.D.   On: 04/17/2024 16:11   CT Head Wo Contrast Result Date: 04/17/2024 CLINICAL DATA:  Facial trauma, blunt 78 year old post unwitnessed fall at home. EXAM: CT HEAD WITHOUT CONTRAST TECHNIQUE: Contiguous axial images were obtained from the base of the skull through the vertex without intravenous contrast. RADIATION DOSE REDUCTION: This exam was performed according to the departmental dose-optimization program which includes automated exposure control, adjustment of the mA and/or kV according to patient size and/or use of iterative reconstruction technique. COMPARISON:  Head CT 2 days ago 04/15/2024 FINDINGS: Brain: No intracranial hemorrhage, mass effect, or midline shift. Stable atrophy and chronic small vessel ischemia. No hydrocephalus. The basilar cisterns are patent. No evidence of territorial infarct or acute ischemia. No extra-axial or intracranial fluid collection. Vascular: Atherosclerosis of skullbase vasculature without hyperdense vessel or abnormal calcification. Skull: No fracture or focal lesion. Sinuses/Orbits: Right maxillary sinus mucosal  thickening. No acute findings. Bilateral cataract resection. Other: No confluent scalp hematoma. IMPRESSION: 1. No acute intracranial abnormality. No skull fracture. 2. Stable atrophy and chronic small vessel ischemia. Electronically Signed   By: Andrea Gasman M.D.   On: 04/17/2024 16:08    Medications Ordered in ED Medications  acetaminophen  (TYLENOL ) tablet 650 mg (has no administration in time range)  lactated ringers  bolus 500 mL (0 mLs Intravenous Stopped 04/17/24 1835)  lidocaine -EPINEPHrine -tetracaine (LET) topical gel (3 mLs Topical Given 04/17/24 1839)    ED Course/ Medical Decision Making/ A&P  Crystal [name] is a 78 y.o. female presents as detailed above  Differential ddx: Frequent calls, dementia,  inability to complete ADLs  On arrival, patient afebrile hemodynamically stable no hypoxia or respiratory distress.  ED Work-up: Please see details of labs and imaging listed above. In summary, patient does not have any acute medical emergency.  Posterior scalp wound small abrasion.  No closure required.  Bacitracin applied. Patient placed in [facility] boarder status for PT OT evaluation to assistance with placement.  All home medications and diet ordered.  Fall and  safety precautions in place.    Overall impression Dementia, frequent falls Patient seen with supervising physician who agrees with plan.  Final Clinical Impression(s) / ED Diagnoses Final diagnoses:  None    Rx / DC Orders ED Discharge Orders     None       Waddell Seats, DO PGY-3 Emergency Medicine    Seats Waddell, DO 04/17/24 2300    Kommor, [place], MD 04/18/24 [phone]

## 2024-04-17 NOTE — ED Triage Notes (Signed)
 Pt states she fell this morning down 12-13 steps. Pt hit head. Pt has laceration to back of head. Bleeding controlled. No LOC. Denies blood thinners. Denies headaches and blurred vision.

## 2024-04-17 NOTE — ED Provider Triage Note (Signed)
 Emergency Medicine Provider Triage Evaluation Note  Crystal [name] , a 78 y.o. female  was evaluated in triage.  Pt complains of fall. Had has multiple falls within the past few days. Increased forgetfulness according to son who is at bedside.  Son voice concerns as pt isn't able to care for herself and has had mental declined for the past few weeks.   Review of Systems  Positive: As above Negative: As above  Physical Exam  BP 123/61   Pulse 78   Temp 97.8 F (36.6 C)   Resp 17   Ht 5' 3 (1.6 m)   Wt 36.3 kg   SpO2 100%   BMI 14.17 kg/m  Gen:   Awake, no distress   Resp:  Normal effort  MSK:   Moves extremities without difficulty  Other:  Dry blood noted to occiput of scalp  Medical Decision Making  Medically screening exam initiated at 3:15 PM.  Appropriate orders placed.  Niels Burton Pear was informed that the remainder of the evaluation will be completed by another provider, this initial triage assessment does not replace that evaluation, and the importance of remaining in the ED until their evaluation is complete.  Recurrent falls, last fell down the steps today    Nivia Colon, PA-C 04/17/24 1517

## 2024-04-18 DIAGNOSIS — S0001XA Abrasion of scalp, initial encounter: Secondary | ICD-10-CM | POA: Diagnosis not present

## 2024-04-18 MED ORDER — LORAZEPAM 0.5 MG PO TABS
0.5000 mg | ORAL_TABLET | Freq: Four times a day (QID) | ORAL | Status: DC | PRN
  Administered 2024-04-18 – 2024-04-20 (×2): 0.5 mg via ORAL
  Filled 2024-04-18 (×2): qty 1

## 2024-04-18 MED ORDER — ASPIRIN 81 MG PO TBEC
81.0000 mg | DELAYED_RELEASE_TABLET | Freq: Every day | ORAL | Status: DC
  Administered 2024-04-18 – 2024-04-20 (×3): 81 mg via ORAL
  Filled 2024-04-18 (×3): qty 1

## 2024-04-18 MED ORDER — TUBERCULIN PPD 5 UNIT/0.1ML ID SOLN
5.0000 [IU] | INTRADERMAL | Status: AC
  Administered 2024-04-18: 5 [IU] via INTRADERMAL
  Filled 2024-04-18: qty 0.1

## 2024-04-18 MED ORDER — AMLODIPINE BESYLATE 5 MG PO TABS
10.0000 mg | ORAL_TABLET | Freq: Every day | ORAL | Status: DC
  Administered 2024-04-18 – 2024-04-20 (×3): 10 mg via ORAL
  Filled 2024-04-18 (×3): qty 2

## 2024-04-18 NOTE — ED Provider Notes (Signed)
 Emergency Medicine Observation Re-evaluation Note  Alasha Mcguinness is a 78 y.o. female, seen on rounds today.  Pt initially presented to the ED for complaints of Altered Mental Status and Fall Currently, the patient is calm and cooperative sitting upright.  Physical Exam  BP (!) 142/58 (BP Location: Right Arm)   Pulse 78   Temp 97.8 F (36.6 C)   Resp 15   Ht 5' 3 (1.6 m)   Wt 36.3 kg   SpO2 100%   BMI 14.17 kg/m  Physical Exam General: Awake. Alert. No acute distress Cardiac: Regular rate rhythm Lungs: Clear to auscultation bilaterally Psych: Calm and cooperative  ED Course / MDM  EKG:EKG Interpretation Date/Time:  Friday April 17 2024 15:23:05 EDT Ventricular Rate:  76 PR Interval:  162 QRS Duration:  68 QT Interval:  418 QTC Calculation: 470 R Axis:   -9  Text Interpretation: Normal sinus rhythm Possible Left atrial enlargement Borderline ECG When compared with ECG of 15-Apr-2024 10:43, PREVIOUS ECG IS PRESENT Confirmed by Kommor, Madison ([id number]) on 04/17/2024 6:49:40 PM  I have reviewed the labs performed to date as well as medications administered while in observation.  Recent changes in the last 24 hours include patient medically cleared after fall at home downstairs.  Difficulty completing ADLs.  Boarded overnight awaiting case management/PT evaluation  Plan  Current plan is for continued boarding in the ED awaiting disposition per [facility] team.    Pamella Ozell LABOR, DO 04/18/24 1205

## 2024-04-18 NOTE — Discharge Instructions (Addendum)
 A signed FL2 was sent to [facility] [place] to support possible Assisted Living Placement. Please follow up with staff there to ensure it was received. Please schedule Primary Care appointment with a physician to establish care and obtain a TB test for ALF placement. Here are a few options that you can contact on Monday to schedule an appointment.  [facility] Medicine [address] [place],  [place]  [address] [phone]  [facility] Primary Care [address], [place],  [place]  [address] [phone]  [facility] [phone]  [facility] Internal Medicine [phone] [address], [place] [address]  [facility] [place], [place] [phone] [address] [place], [place] [address]  [facility] [place] [phone] [address] [place], [place] [address]

## 2024-04-18 NOTE — [place] FL2 (Signed)
  [place]  MEDICAID [id number] LEVEL OF CARE FORM     IDENTIFICATION  Patient Name: Crystal [name] Birthdate: [birth year] Sex: female Admission Date (Current Location): 04/17/2024  [facility] and [place] Number:  [employer] and Address:  [facility], [address], [place], [place] [address]      Provider Number: [id number]  Attending Physician Name and Address:  Dr. Jayson Pereyra  Relative Name and Phone Number:       Current Level of Care: Hospital Recommended Level of Care: Assisted Living Facility Prior Approval Number:    Date Approved/Denied:   PASRR Number:    Discharge Plan: Other (Comment) (Assisted Living Facility)    Current Diagnoses: Patient Active Problem List   Diagnosis Date Noted   Multiple falls 04/15/2024   Hypertensive urgency 04/15/2024   Vitamin D  deficiency 10/11/2015   Other abnormal glucose 10/11/2015   Medication management 10/11/2015   Osteoporosis 03/28/2015   Breast cancer of lower-inner quadrant of right female breast (HCC) 07/20/2013   Venous (peripheral) insufficiency 07/18/2010   Peripheral vascular disease (HCC) 06/17/2010   Cerebrovascular disease 06/16/2010   Transient cerebral ischemia 03/02/2010   STENOSIS, RECTAL 02/16/2009   Irritable bowel syndrome 01/12/2009   Hyperlipidemia 05/10/2007   Essential hypertension 05/10/2007   GERD 05/10/2007    Orientation RESPIRATION BLADDER Height & Weight     Self  Normal Continent Weight: 80 lb (36.3 kg) Height:  5' 3 (160 cm)  BEHAVIORAL SYMPTOMS/MOOD NEUROLOGICAL BOWEL NUTRITION STATUS      Continent Diet (Regular)  AMBULATORY STATUS COMMUNICATION OF NEEDS Skin   Supervision Verbally Normal                       Personal Care Assistance Level of Assistance  Bathing, Feeding, Dressing Bathing Assistance: Limited assistance Feeding assistance: Independent Dressing Assistance: Limited assistance     Functional Limitations Info  Sight,  Hearing, Speech Sight Info: Impaired Hearing Info: Adequate Speech Info: Adequate    SPECIAL CARE FACTORS FREQUENCY  PT (By licensed PT), OT (By licensed OT)     PT Frequency: 5x week OT Frequency: 5x week            Contractures Contractures Info: Not present    Additional Factors Info  Code Status, Allergies Code Status Info: DNR Allergies Info: No known allergies           Current Medications (04/18/2024):  This is the current hospital active medication list Current Facility-Administered Medications  Medication Dose Route Frequency Provider Last Rate Last Admin   acetaminophen  (TYLENOL ) tablet 650 mg  650 mg Oral Q4H PRN Sherrine Birmingham, DO       Current Outpatient Medications  Medication Sig Dispense Refill   acetaminophen  (TYLENOL ) 500 MG tablet Take 500 mg by mouth every 6 (six) hours as needed for mild pain (pain score 1-3) or headache.     amLODipine  (NORVASC ) 10 MG tablet Take 1 tablet (10 mg total) by mouth daily. 30 tablet 0   aspirin 81 MG tablet Take 81 mg by mouth daily.     polyethylene glycol (MIRALAX  / GLYCOLAX ) packet Take 17 g by mouth 3 (three) times a week. Reported on 04/02/2016       Discharge Medications: Please see discharge summary for a list of discharge medications.  Relevant Imaging Results:  Relevant Lab Results:   Additional Information SSN [id number]  Niels LITTIE Portugal, LCSW

## 2024-04-18 NOTE — ED Notes (Signed)
 Patient changed into gown and eating dinner. Linens changed as well

## 2024-04-18 NOTE — Progress Notes (Addendum)
 2:13pm: CSW spoke with patient's son Marcey to discuss discharge. Marcey requesting CSW obtain a RN to watch patient and home so that she doesn't fall again prior to placement at [facility]. CSW explained the limitations of home health services and that obtaining a RN through a home health agency would not be possible. CSW informed Marcey that he would have to pay privately for private duty sitting services if that is what he believes patient needs. CSW explained the PT recommendations for home health services and not SNF placement.   CSW spoke with Lonni, RN regarding patient. RN states MD agreeable to have patient board in the ED until Monday until she can go to the facility. TB skin test has been ordered.  8:50am: CSW spoke with patient's son Marcey to discuss discharge plan. Marcey confirms he has been in communication with staff at [facility] in [place] [facility]. Marcey states patient does not have a definite admission date yet. CSW informed Marcey that patient cannot remain as a boarder until placement can occur. CSW explained that a TB test and FL2 is required for ALF placement. Marcey states patient does not have a PCP and hasn't been to the doctor in years - Marcey requesting CSW add a list of PCP to patient's AVS. Marcey requesting CSW completed FL2 for ALF and fax it to [facility]. Marcey states he will come pick patient up in about an hour.  CSW will complete FL2 and fax it to [facility]. CSW added PCP list to AVS.  CSW informed RN and MD of information.  Niels Portugal, MSW, LCSW Transitions of Care  Clinical Social Worker II [phone]

## 2024-04-18 NOTE — Evaluation (Signed)
 Physical Therapy Evaluation Patient Details Name: Crystal [name] MRN: [id number] DOB: Aug 23, 1946 Today's Date: 04/18/2024  History of Present Illness  78 yo female admitted with multiple falls and weakness. Recent d/c from [facility] 6/26 with additional fall. PMH HTN Breast CA CVA IBS GERD  Clinical Impression  Pt is presenting below baseline level of functioning. Currently pt cognitively has very poor safety awareness and will need 24/7 assist following discharge from acute care hospital setting. Pt is demonstrating high risk for falls scoring 32/54 on BERG balance test and 13/24 on DGI placing pt at HIGH RISK for falls. Pt has had multiple falls recently including down 12 steps hitting her head. Pt could benefit from skilled physical therapy services on discharge from acute care hospital setting 3x/week with 24/7 supervision provided. Pt will be discharged at this time and all further needs can be met in next venue of care.       If plan is discharge home, recommend the following: A little help with walking and/or transfers;Help with stairs or ramp for entrance;Assistance with cooking/housework;Assist for transportation     Equipment Recommendations None recommended by PT     Functional Status Assessment Patient has had a recent decline in their functional status and demonstrates the ability to make significant improvements in function in a reasonable and predictable amount of time.     Precautions / Restrictions Precautions Precautions: Fall Recall of Precautions/Restrictions: Impaired Restrictions Weight Bearing Restrictions Per Provider Order: No      Mobility  Bed Mobility Overal bed mobility: Independent    Transfers Overall transfer level: Needs assistance Equipment used: None Transfers: Sit to/from Stand Sit to Stand: Supervision           General transfer comment: Pt has poor balance, wide BOS, poor safety awareness.    Ambulation/Gait Ambulation/Gait assistance:  Contact guard assist Gait Distance (Feet): 100 Feet Assistive device: None Gait Pattern/deviations: Decreased step length - right, Decreased step length - left, Decreased stride length Gait velocity: decreased Gait velocity interpretation: <1.8 ft/sec, indicate of risk for recurrent falls   General Gait Details: pt has poor balance, walks with arms in guarded position, CGA for safety      Balance Overall balance assessment: Needs assistance Sitting-balance support: No upper extremity supported, Feet supported Sitting balance-Leahy Scale: Good     Standing balance support: During functional activity, No upper extremity supported Standing balance-Leahy Scale: Fair       Standardized Balance Assessment Standardized Balance Assessment : Berg Balance Test, Dynamic Gait Index Berg Balance Test Sit to Stand: Able to stand  independently using hands Standing Unsupported: Able to stand 2 minutes with supervision Sitting with Back Unsupported but Feet Supported on Floor or Stool: Able to sit safely and securely 2 minutes Stand to Sit: Controls descent by using hands Transfers: Able to transfer safely, definite need of hands Standing Unsupported with Eyes Closed: Able to stand 3 seconds Standing Ubsupported with Feet Together: Able to place feet together independently but unable to hold for 30 seconds From Standing, Reach Forward with Outstretched Arm: Can reach forward >5 cm safely (2) From Standing Position, Pick up Object from Floor: Able to pick up shoe, needs supervision From Standing Position, Turn to Look Behind Over each Shoulder: Looks behind from both sides and weight shifts well Turn 360 Degrees: Needs assistance while turning Standing Unsupported, Alternately Place Feet on Step/Stool: Able to complete >2 steps/needs minimal assist Standing Unsupported, One Foot in Front: Loses balance while stepping or standing Standing  on One Leg: Able to lift leg independently and hold equal  to or more than 3 seconds Total Score: 32 Dynamic Gait Index Level Surface: Mild Impairment Change in Gait Speed: Mild Impairment Gait with Horizontal Head Turns: Mild Impairment Gait with Vertical Head Turns: Moderate Impairment Gait and Pivot Turn: Mild Impairment Step Over Obstacle: Mild Impairment Step Around Obstacles: Moderate Impairment Steps: Moderate Impairment Total Score: 13       Pertinent Vitals/Pain Pain Assessment Pain Assessment: No/denies pain    Home Living Family/patient expects to be discharged to:: Private residence Living Arrangements: Alone (pt has a cat named Tia) Available Help at Discharge: Family;Available PRN/intermittently Type of Home: House Home Access: Stairs to enter   Entrance Stairs-Number of Steps: 1 Alternate Level Stairs-Number of Steps: 11 Home Layout: Two level Home Equipment: Cane - single point      Prior Function Prior Level of Function : Independent/Modified Independent;History of Falls (last six months)             Mobility Comments: Community ambulator without AD; drives ADLs Comments: Independent     Extremity/Trunk Assessment   Upper Extremity Assessment Upper Extremity Assessment: Defer to OT evaluation    Lower Extremity Assessment Lower Extremity Assessment: Generalized weakness    Cervical / Trunk Assessment Cervical / Trunk Assessment: Normal  Communication   Communication Communication: No apparent difficulties    Cognition Arousal: Alert Behavior During Therapy: WFL for tasks assessed/performed   PT - Cognitive impairments: No family/caregiver present to determine baseline, Orientation   Orientation impairments: Situation                   PT - Cognition Comments: Pt is very unaware of safety issues. Has RW at home she does not use, Doesn't use rail on stairs, asked if the pulse ox belonged to this physical therapist that had been on her finger since beginning of session. Asked if her  food tray was hers. Following commands: Intact       Cueing Cueing Techniques: Verbal cues            Assessment/Plan    PT Assessment All further PT needs can be met in the next venue of care  PT Problem List Decreased activity tolerance;Decreased balance;Decreased mobility;Decreased strength           PT Goals (Current goals can be found in the Care Plan section)  Acute Rehab PT Goals Patient Stated Goal: Pt wants to return home PT Goal Formulation: With patient Time For Goal Achievement: 05/02/24 Potential to Achieve Goals: Fair            AM-PAC PT 6 Clicks Mobility  Outcome Measure Help needed turning from your back to your side while in a flat bed without using bedrails?: None Help needed moving from lying on your back to sitting on the side of a flat bed without using bedrails?: None Help needed moving to and from a bed to a chair (including a wheelchair)?: A Little Help needed standing up from a chair using your arms (e.g., wheelchair or bedside chair)?: A Little Help needed to walk in hospital room?: A Little Help needed climbing 3-5 steps with a railing? : A Little 6 Click Score: 20    End of Session Equipment Utilized During Treatment: Gait belt Activity Tolerance: Patient tolerated treatment well Patient left: in bed;with call bell/phone within reach Nurse Communication: Mobility status PT Visit Diagnosis: Unsteadiness on feet (R26.81);Other abnormalities of gait and mobility (R26.89);Muscle weakness (generalized) (  M62.81)    Time: 9061-9048 PT Time Calculation (min) (ACUTE ONLY): 13 min   Charges:   PT Evaluation $PT Eval Low Complexity: 1 Low   PT General Charges $$ ACUTE PT VISIT: 1 Visit        Dorothyann Maier, DPT, CLT  Acute Rehabilitation Services Office: [phone] (Secure chat preferred)   Dorothyann VEAR Maier 04/18/2024, 10:01 AM

## 2024-04-18 NOTE — Evaluation (Signed)
 Occupational Therapy Evaluation Patient Details Name: Crystal [name] MRN: [id number] DOB: September 30, 1946 Today's Date: 04/18/2024   History of Present Illness   78 yo female admitted with multiple falls and weakness. Recent d/c from [facility] 6/26 with additional fall. PMH HTN Breast CA CVA IBS GERD     Clinical Impressions Patient evaluated by Occupational Therapy with no further acute OT needs identified. All education has been completed and the patient has no further questions. See below for any follow-up Occupational Therapy or equipment needs. OT to sign off. Thank you for referral.   Pt benefits from (A) with transfer due to fall risk. Pt with cognitive deficits and could benefit from further assessment of medication management / and caring for her pet properly.      If plan is discharge home, recommend the following:   Assist for transportation     Functional Status Assessment   Patient has had a recent decline in their functional status and demonstrates the ability to make significant improvements in function in a reasonable and predictable amount of time.     Equipment Recommendations   Tub/shower seat     Recommendations for Other Services         Precautions/Restrictions   Precautions Precautions: Fall Recall of Precautions/Restrictions: Impaired     Mobility Bed Mobility Overal bed mobility: Independent                  Transfers Overall transfer level: Modified independent                        Balance Overall balance assessment: Needs assistance           Standing balance-Leahy Scale: Fair               High level balance activites: Direction changes, Turns, Sudden stops, Head turns (dual task) High Level Balance Comments: decreased gait velocity. pt with drift to the left with scanning L side in the hall.           ADL either performed or assessed with clinical judgement   ADL Overall ADL's : Needs  assistance/impaired Eating/Feeding: Modified independent Eating/Feeding Details (indicate cue type and reason): poor intake of meal in room.             Upper Body Dressing : Supervision/safety   Lower Body Dressing: Supervision/safety               Functional mobility during ADLs: Supervision/safety General ADL Comments: pt educated that IV must stay in arm until d/c and pt at end of session asking question again about d/c of the IV with poor recall     Vision Baseline Vision/History: 1 Wears glasses       Perception         Praxis         Pertinent Vitals/Pain Pain Assessment Pain Assessment: No/denies pain     Extremity/Trunk Assessment Upper Extremity Assessment Upper Extremity Assessment: Overall WFL for tasks assessed   Lower Extremity Assessment Lower Extremity Assessment: Overall WFL for tasks assessed   Cervical / Trunk Assessment Cervical / Trunk Assessment: Normal   Communication Communication Communication: No apparent difficulties   Cognition Arousal: Alert Behavior During Therapy: WFL for tasks assessed/performed Cognition: Cognition impaired     Awareness: Intellectual awareness intact, Online awareness impaired       OT - Cognition Comments: poor pathfinding skills becoming disoriented to room after walking a square path. pt able to  use visuals cues of room numbers to navigate back to room. pt iwth decreased gait velocity with dual task ( naming animals alphabetical                 Following commands: Intact       Cueing  General Comments          Exercises     Shoulder Instructions      Home Living Family/patient expects to be discharged to:: Private residence Living Arrangements: Alone Available Help at Discharge: Family;Available PRN/intermittently Type of Home: House Home Access: Stairs to enter Entrance Stairs-Number of Steps: 1   Home Layout: Two level Alternate Level Stairs-Number of Steps:  11 Alternate Level Stairs-Rails: Right Bathroom Shower/Tub: Tub/shower unit;Walk-in shower   Bathroom Toilet: Standard Bathroom Accessibility: Yes   Home Equipment: Cane - single point   Additional Comments: has a cat that is 26 yo stated several times during session she doesnt know she is a cat      Prior Functioning/Environment Prior Level of Function : Independent/Modified Independent;History of Falls (last six months)             Mobility Comments: Community ambulator without AD; drives ADLs Comments: Independent    OT Problem List: Decreased strength;Impaired balance (sitting and/or standing)   OT Treatment/Interventions:        OT Goals(Current goals can be found in the care plan section)   Acute Rehab OT Goals Patient Stated Goal: to go home to cat   OT Frequency:       Co-evaluation              AM-PAC OT 6 Clicks Daily Activity     Outcome Measure Help from another person eating meals?: None Help from another person taking care of personal grooming?: A Little Help from another person toileting, which includes using toliet, bedpan, or urinal?: A Little Help from another person bathing (including washing, rinsing, drying)?: A Little Help from another person to put on and taking off regular upper body clothing?: A Little Help from another person to put on and taking off regular lower body clothing?: A Little 6 Click Score: 19   End of Session Equipment Utilized During Treatment: Gait belt Nurse Communication: Mobility status  Activity Tolerance: Patient tolerated treatment well Patient left: in bed;with call bell/phone within reach  OT Visit Diagnosis: Unsteadiness on feet (R26.81);Other abnormalities of gait and mobility (R26.89);Muscle weakness (generalized) (M62.81)                Time: 8895-8882 OT Time Calculation (min): 13 min Charges:  OT General Charges $OT Visit: 1 Visit OT Evaluation $OT Eval Low Complexity: 1 Low   Crystal [name],  OTR/L  Acute Rehabilitation Services Office: [phone] .   Crystal [name] 04/18/2024, 12:07 PM

## 2024-04-19 DIAGNOSIS — S0001XA Abrasion of scalp, initial encounter: Secondary | ICD-10-CM | POA: Diagnosis not present

## 2024-04-19 NOTE — ED Provider Notes (Signed)
 Emergency Medicine Observation Re-evaluation Note  Crystal [name] is a 78 y.o. female, seen on rounds today.  Pt initially presented to the ED for complaints of Altered Mental Status and Fall Currently, the patient is slightly agitated and repeatedly trying to take her clothes off and get up out of bed.  Requiring a sitter.  Physical Exam  BP (!) 152/65   Pulse 88   Temp 98 F (36.7 C) (Oral)   Resp 18   Ht 5' 3 (1.6 m)   Wt 36.3 kg   SpO2 100%   BMI 14.17 kg/m  Physical Exam General: Awake. Alert. No acute distress Cardiac: Regular rate rhythm Lungs: Clear to auscultation bilaterally Psych: Calm and cooperative  ED Course / MDM  EKG:EKG Interpretation Date/Time:  Friday April 17 2024 15:23:05 EDT Ventricular Rate:  76 PR Interval:  162 QRS Duration:  68 QT Interval:  418 QTC Calculation: 470 R Axis:   -9  Text Interpretation: Normal sinus rhythm Possible Left atrial enlargement Borderline ECG When compared with ECG of 15-Apr-2024 10:43, PREVIOUS ECG IS PRESENT Confirmed by Kommor, Madison ([id number]) on 04/17/2024 6:49:40 PM  I have reviewed the labs performed to date as well as medications administered while in observation.  Recent changes in the last 24 hours include some increasing agitation requiring one-to-one sitter but redirectable not requiringAdditional medication for sedation at this time  Plan  Current plan is for continue boarding in the ED awaiting placement.    Pamella Ozell LABOR, DO 04/19/24 1352

## 2024-04-19 NOTE — ED Notes (Signed)
 Pt continuing to come out of room stating she can't find her father or brother and that she is leaving. Pt easily redirected to room. Very anxious. Pt encouraged to call her family with her cell phone. Sitter order placed.

## 2024-04-19 NOTE — ED Notes (Signed)
 Pt got out of bed and walked out of room. Stated she was going upstairs to get a shower. Pleasant and cooperative. Redirected to her room.

## 2024-04-19 NOTE — ED Notes (Signed)
 Patient remains sitting in bed visiting with son. Eating provided snacks son brought and drinking soda at this time. Denies any needs or pain.

## 2024-04-19 NOTE — ED Notes (Signed)
 Neighbor in to visit patient. Patient changed back into hospital gown and socks after changing into her clothing from home. Bed remade after patient folded sheets.

## 2024-04-19 NOTE — ED Notes (Signed)
 Pt remains pleasantly confused. She is alert to herself only.

## 2024-04-19 NOTE — ED Notes (Signed)
 Dinner tray delivered at this time. Patient initially reluctant to eat stating her son was supposed to bring her food. I told patient that I was unsure if her son was going to be able to return tonight and that I did not want her to be hungry while waiting. Patient agreed to eat food delivered. Remains sitting in bed. Changed back into hospital gown after changing into clothes from home again.

## 2024-04-19 NOTE — Progress Notes (Signed)
 Patient's TB skin test was placed at 5:35pm on 04/18/24.  CSW spoke with Nathanel at [facility] who states patient's son just left the facility. Nathanel states there is nothing needed at this time for patient to admit after the TB test is read. CSW and Nathanel discussed patient's mobility levels also.  Niels Portugal, MSW, LCSW Transitions of Care  Clinical Social Worker II [phone]

## 2024-04-19 NOTE — ED Notes (Signed)
 Son leaving at this time. Inquiring about an FL2 Form being sent to [facility] in [place], [place].

## 2024-04-19 NOTE — ED Notes (Signed)
 Patient resting comfortably.  Respirations even and unlabored.  Sitter remains at bedside

## 2024-04-19 NOTE — ED Notes (Signed)
 Patient changed into hospital gown at this time. Shirt and shoes placed in belongings bag. Yellow fall socks placed on patient as well as fall risk bracelet. Patient denies any needs at this time.

## 2024-04-19 NOTE — ED Notes (Signed)
 Son and neighbor remain visiting at patient's bedside

## 2024-04-19 NOTE — ED Notes (Signed)
 Pt in her room sitting in a recliner keeping herself busy with items in her purse, sitting in the doorway in view of the nurse station.

## 2024-04-20 DIAGNOSIS — S0001XA Abrasion of scalp, initial encounter: Secondary | ICD-10-CM | POA: Diagnosis not present

## 2024-04-20 NOTE — ED Notes (Signed)
Walked patient to the bathroom patient did well 

## 2024-04-20 NOTE — ED Notes (Signed)
 Assisted patient to restroom.

## 2024-04-20 NOTE — ED Provider Notes (Signed)
 Emergency Medicine Observation Re-evaluation Note  Crystal [name] is a 78 y.o. female, seen on rounds today.  Pt initially presented to the ED for complaints of Altered Mental Status and Fall Currently, the patient is awaiting facility placement.  Physical Exam  BP (!) 155/76   Pulse 87   Temp 97.8 F (36.6 C) (Oral)   Resp 15   Ht 1.6 m (5' 3)   Wt 36.3 kg   SpO2 100%   BMI 14.17 kg/m  Physical Exam General: Nontoxic no acute distress Cardiac:  Lungs: No respiratory distress Psych: Baseline  ED Course / MDM  EKG:EKG Interpretation Date/Time:  Friday April 17 2024 15:23:05 EDT Ventricular Rate:  76 PR Interval:  162 QRS Duration:  68 QT Interval:  418 QTC Calculation: 470 R Axis:   -9  Text Interpretation: Normal sinus rhythm Possible Left atrial enlargement Borderline ECG When compared with ECG of 15-Apr-2024 10:43, PREVIOUS ECG IS PRESENT Confirmed by Kommor, Madison ([id number]) on 04/17/2024 6:49:40 PM  I have reviewed the labs performed to date as well as medications administered while in observation.  Recent changes in the last 24 hours include that patient may be accepted by [facility] facility.  They were awaiting TB test.  Plan  Current plan is for awaiting placement which may occur [place].    Marivel Mcclarty, MD 04/20/24 [phone]

## 2024-04-20 NOTE — ED Notes (Signed)
 Patient is back in bed on monitor got patient some blankets patient has call bell in reach

## 2024-04-20 NOTE — ED Notes (Signed)
 Patient provided with discharge paperwork. Pt demonstrated understanding of material. All questions, comments, and concerns addressed. Pt ambulated in hallway towards exit with no complications

## 2024-04-20 NOTE — ED Notes (Signed)
 Gave report to [employer]. Report given to Tiffany.

## 2024-04-20 NOTE — NC FL2 (Signed)
 [place]  MEDICAID FL2 LEVEL OF CARE FORM     IDENTIFICATION  Patient Name: Crystal [name] Birthdate: 1946/02/27 Sex: female Admission Date (Current Location): 04/17/2024  [facility] and [place] Number:  [employer] and Address:  The [place], [address], [place], [place] [address]      Provider Number: [id number]  Attending Physician Name and Address:  System, Provider Not In  Relative Name and Phone Number:       Current Level of Care: Hospital Recommended Level of Care: Skilled Nursing Facility Prior Approval Number:    Date Approved/Denied:   PASRR Number: [id number]  Discharge Plan: SNF    Current Diagnoses: Patient Active Problem List   Diagnosis Date Noted   Multiple falls 04/15/2024   Hypertensive urgency 04/15/2024   Vitamin D  deficiency 10/11/2015   Other abnormal glucose 10/11/2015   Medication management 10/11/2015   Osteoporosis 03/28/2015   Breast cancer of lower-inner quadrant of right female breast (HCC) 07/20/2013   Venous (peripheral) insufficiency 07/18/2010   Peripheral vascular disease (HCC) 06/17/2010   Cerebrovascular disease 06/16/2010   Transient cerebral ischemia 03/02/2010   STENOSIS, RECTAL 02/16/2009   Irritable bowel syndrome 01/12/2009   Hyperlipidemia 05/10/2007   Essential hypertension 05/10/2007   GERD 05/10/2007    Orientation RESPIRATION BLADDER Height & Weight     Self  Normal Continent Weight: 80 lb (36.3 kg) Height:  5' 3 (160 cm)  BEHAVIORAL SYMPTOMS/MOOD NEUROLOGICAL BOWEL NUTRITION STATUS      Continent Diet (Regular)  AMBULATORY STATUS COMMUNICATION OF NEEDS Skin   Supervision Verbally Normal                       Personal Care Assistance Level of Assistance  Bathing, Feeding, Dressing Bathing Assistance: Limited assistance Feeding assistance: Independent Dressing Assistance: Limited assistance     Functional Limitations Info  Sight, Hearing, Speech Sight  Info: Impaired Hearing Info: Adequate Speech Info: Adequate    SPECIAL CARE FACTORS FREQUENCY  PT (By licensed PT), OT (By licensed OT)     PT Frequency: 5x weekly OT Frequency: 5x weekly            Contractures Contractures Info: Not present    Additional Factors Info  Code Status, Allergies Code Status Info: DNR Allergies Info: No known allergies           Current Medications (04/20/2024):  This is the current hospital active medication list Current Facility-Administered Medications  Medication Dose Route Frequency Provider Last Rate Last Admin   acetaminophen  (TYLENOL ) tablet 650 mg  650 mg Oral Q4H PRN Sherrine Birmingham, DO       amLODipine  (NORVASC ) tablet 10 mg  10 mg Oral Daily Patsey Lot, MD   10 mg at 04/20/24 0857   aspirin EC tablet 81 mg  81 mg Oral Daily Patsey Lot, MD   81 mg at 04/20/24 0857   LORazepam (ATIVAN) tablet 0.5 mg  0.5 mg Oral Q6H PRN Patsey Lot, MD   0.5 mg at 04/20/24 0043   tuberculin injection 5 Units  5 Units Intradermal STAT Penna, Michael A, DO   5 Units at 04/18/24 1735   Current Outpatient Medications  Medication Sig Dispense Refill   acetaminophen  (TYLENOL ) 500 MG tablet Take 500 mg by mouth every 6 (six) hours as needed for mild pain (pain score 1-3) or headache.     amLODipine  (NORVASC ) 10 MG tablet Take 1 tablet (10 mg total) by mouth daily.  30 tablet 0   aspirin 81 MG tablet Take 81 mg by mouth daily.     polyethylene glycol (MIRALAX  / GLYCOLAX ) packet Take 17 g by mouth 3 (three) times a week. Reported on 04/02/2016       Discharge Medications: Please see discharge summary for a list of discharge medications.  Relevant Imaging Results:  Relevant Lab Results:   Additional Information SSN [id number]  Niels LITTIE Portugal, LCSW

## 2024-04-20 NOTE — ED Provider Notes (Signed)
 Patient been excepted by [facility] nursing facility.  Patient ready for discharge and transport.   Jovahn Breit, MD 04/20/24 [phone]

## 2024-04-20 NOTE — Progress Notes (Addendum)
 2:30pm: CSW sent AVS via secure email to [place] at [facility].  2pm: CSW received call from [place] at [facility] who states everything is completed and can be admitted to the facility today.  Patient will go to [facility] via personal vehicle with her son Marcey. The number to call for report is [phone].  Patient's DONALD is completed #[id number].  CSW spoke with Marcey who states he is at the facility now and will be here shortly to pick up patient to take her to the facility.  RN and MD aware of plan.  9:45am: CSW received call from [place] who states he is on his way to sign paperwork at the facility then he will come pick patient up and take her to [facility].  9:20am: CSW spoke with Garrel in admissions at [facility] who states the facility can offer the patient a STR bed. CSW provided Garrel with patient's son contact information to discuss financial requirements. Garrel to reach out to [place] and return call to CSW.  8:50am: CSW received call from patient's son Marcey stating that per staff at [facility] patient needs to go to SNF prior to ALF due to her being at the hospital for a fall. Marcey states he will pay privately for SNF as he understands patient cannot use her Medicare for SNF due to no qualifying stay. CSW sent clinicals via hub to [facility] for review.  Niels Portugal, MSW, LCSW Transitions of Care  Clinical Social Worker II [phone]

## 2024-04-22 LAB — POCT I-STAT, CHEM 8
BUN: 22 mg/dL (ref 8–23)
Calcium, Ion: 1.2 mmol/L (ref 1.15–1.40)
Chloride: 99 mmol/L (ref 98–111)
Creatinine, Ser: 1 mg/dL (ref 0.44–1.00)
Glucose, Bld: 97 mg/dL (ref 70–99)
HCT: 38 % (ref 36.0–46.0)
Hemoglobin: 12.9 g/dL (ref 12.0–15.0)
Potassium: 3.8 mmol/L (ref 3.5–5.1)
Sodium: 140 mmol/L (ref 135–145)
TCO2: 29 mmol/L (ref 22–32)

## 2024-05-30 ENCOUNTER — Other Ambulatory Visit: Payer: Self-pay

## 2024-05-30 ENCOUNTER — Observation Stay (HOSPITAL_COMMUNITY)
Admission: EM | Admit: 2024-05-30 | Discharge: 2024-05-31 | Disposition: A | Discharge status: Home Health | Attending: Family Medicine | Admitting: Family Medicine

## 2024-05-30 ENCOUNTER — Emergency Department (HOSPITAL_COMMUNITY)

## 2024-05-30 ENCOUNTER — Observation Stay (HOSPITAL_BASED_OUTPATIENT_CLINIC_OR_DEPARTMENT_OTHER)

## 2024-05-30 ENCOUNTER — Encounter (HOSPITAL_COMMUNITY): Payer: Self-pay

## 2024-05-30 DIAGNOSIS — I739 Peripheral vascular disease, unspecified: Secondary | ICD-10-CM | POA: Insufficient documentation

## 2024-05-30 DIAGNOSIS — F0390 Unspecified dementia without behavioral disturbance: Secondary | ICD-10-CM | POA: Insufficient documentation

## 2024-05-30 DIAGNOSIS — I10 Essential (primary) hypertension: Secondary | ICD-10-CM | POA: Diagnosis not present

## 2024-05-30 DIAGNOSIS — R296 Repeated falls: Secondary | ICD-10-CM | POA: Insufficient documentation

## 2024-05-30 DIAGNOSIS — R55 Syncope and collapse: Principal | ICD-10-CM | POA: Insufficient documentation

## 2024-05-30 DIAGNOSIS — E785 Hyperlipidemia, unspecified: Secondary | ICD-10-CM | POA: Diagnosis not present

## 2024-05-30 DIAGNOSIS — F1090 Alcohol use, unspecified, uncomplicated: Secondary | ICD-10-CM | POA: Insufficient documentation

## 2024-05-30 DIAGNOSIS — Z87891 Personal history of nicotine dependence: Secondary | ICD-10-CM | POA: Diagnosis not present

## 2024-05-30 DIAGNOSIS — F419 Anxiety disorder, unspecified: Secondary | ICD-10-CM | POA: Diagnosis not present

## 2024-05-30 DIAGNOSIS — Z7982 Long term (current) use of aspirin: Secondary | ICD-10-CM | POA: Diagnosis not present

## 2024-05-30 DIAGNOSIS — C50311 Malignant neoplasm of lower-inner quadrant of right female breast: Secondary | ICD-10-CM | POA: Diagnosis not present

## 2024-05-30 DIAGNOSIS — I679 Cerebrovascular disease, unspecified: Secondary | ICD-10-CM | POA: Diagnosis not present

## 2024-05-30 LAB — ECHOCARDIOGRAM COMPLETE
AR max vel: 1.75 cm2
AV Area VTI: 1.82 cm2
AV Area mean vel: 1.74 cm2
AV Mean grad: 1.7 mmHg
AV Peak grad: 3.3 mmHg
Ao pk vel: 0.91 m/s
Height: 63 in
P 1/2 time: 350 ms
S' Lateral: 1.7 cm
Weight: 1266.32 [oz_av]

## 2024-05-30 LAB — CBC WITH DIFFERENTIAL/PLATELET
Abs Immature Granulocytes: 0.04 K/uL (ref 0.00–0.07)
Basophils Absolute: 0 K/uL (ref 0.0–0.1)
Basophils Relative: 0 %
Eosinophils Absolute: 0 K/uL (ref 0.0–0.5)
Eosinophils Relative: 0 %
HCT: 34.1 % — ABNORMAL LOW (ref 36.0–46.0)
Hemoglobin: 10.9 g/dL — ABNORMAL LOW (ref 12.0–15.0)
Immature Granulocytes: 0 %
Lymphocytes Relative: 6 %
Lymphs Abs: 0.7 K/uL (ref 0.7–4.0)
MCH: 32.4 pg (ref 26.0–34.0)
MCHC: 32 g/dL (ref 30.0–36.0)
MCV: 101.5 fL — ABNORMAL HIGH (ref 80.0–100.0)
Monocytes Absolute: 0.5 K/uL (ref 0.1–1.0)
Monocytes Relative: 4 %
Neutro Abs: 10.4 K/uL — ABNORMAL HIGH (ref 1.7–7.7)
Neutrophils Relative %: 90 %
Platelets: 257 K/uL (ref 150–400)
RBC: 3.36 MIL/uL — ABNORMAL LOW (ref 3.87–5.11)
RDW: 13.3 % (ref 11.5–15.5)
WBC: 11.7 K/uL — ABNORMAL HIGH (ref 4.0–10.5)
nRBC: 0 % (ref 0.0–0.2)

## 2024-05-30 LAB — COMPREHENSIVE METABOLIC PANEL WITH GFR
ALT: 14 U/L (ref 0–44)
AST: 26 U/L (ref 15–41)
Albumin: 3.9 g/dL (ref 3.5–5.0)
Alkaline Phosphatase: 89 U/L (ref 38–126)
Anion gap: 10 (ref 5–15)
BUN: 25 mg/dL — ABNORMAL HIGH (ref 8–23)
CO2: 26 mmol/L (ref 22–32)
Calcium: 9.5 mg/dL (ref 8.9–10.3)
Chloride: 104 mmol/L (ref 98–111)
Creatinine, Ser: 0.76 mg/dL (ref 0.44–1.00)
GFR, Estimated: >60 mL/min (ref >60)
Glucose, Bld: 108 mg/dL — ABNORMAL HIGH (ref 70–99)
Potassium: 4.1 mmol/L (ref 3.5–5.1)
Sodium: 140 mmol/L (ref 135–145)
Total Bilirubin: 1.3 mg/dL — ABNORMAL HIGH (ref 0.0–1.2)
Total Protein: 6.6 g/dL (ref 6.5–8.1)

## 2024-05-30 LAB — URINALYSIS, ROUTINE W REFLEX MICROSCOPIC
Bilirubin Urine: NEGATIVE
Glucose, UA: NEGATIVE mg/dL
Hgb urine dipstick: NEGATIVE
Ketones, ur: NEGATIVE mg/dL
Leukocytes,Ua: NEGATIVE
Nitrite: NEGATIVE
Protein, ur: NEGATIVE mg/dL
Specific Gravity, Urine: 1.013 (ref 1.005–1.030)
pH: 7 (ref 5.0–8.0)

## 2024-05-30 LAB — TROPONIN I (HIGH SENSITIVITY)
Troponin I (High Sensitivity): 5 ng/L (ref <18)
Troponin I (High Sensitivity): 6 ng/L (ref <18)

## 2024-05-30 LAB — CBG MONITORING, ED: Glucose-Capillary: 114 mg/dL — ABNORMAL HIGH (ref 70–99)

## 2024-05-30 MED ORDER — ACETAMINOPHEN 650 MG RE SUPP
650.0000 mg | Freq: Four times a day (QID) | RECTAL | Status: DC | PRN
Start: 2024-05-30 — End: 2024-05-31

## 2024-05-30 MED ORDER — POLYETHYLENE GLYCOL 3350 17 G PO PACK: 17.0000 g | PACK | Freq: Every day | ORAL | Status: DC | PRN

## 2024-05-30 MED ORDER — POLYETHYLENE GLYCOL 3350 17 G PO PACK: 17.0000 g | PACK | ORAL | Status: DC

## 2024-05-30 MED ORDER — TRAZODONE HCL 50 MG PO TABS
50.0000 mg | ORAL_TABLET | Freq: Every day | ORAL | Status: DC
  Administered 2024-05-30: 50 mg via ORAL
  Filled 2024-05-30: qty 1

## 2024-05-30 MED ORDER — ALPRAZOLAM 0.5 MG PO TABS
0.5000 mg | ORAL_TABLET | Freq: Two times a day (BID) | ORAL | Status: DC | PRN
  Administered 2024-05-31: 0.5 mg via ORAL
  Filled 2024-05-30: qty 1

## 2024-05-30 MED ORDER — AMLODIPINE BESYLATE 5 MG PO TABS
10.0000 mg | ORAL_TABLET | Freq: Every day | ORAL | Status: DC
  Administered 2024-05-30 – 2024-05-31 (×2): 10 mg via ORAL
  Filled 2024-05-30 (×2): qty 2

## 2024-05-30 MED ORDER — HYDRALAZINE HCL 20 MG/ML IJ SOLN
10.0000 mg | Freq: Four times a day (QID) | INTRAMUSCULAR | Status: DC | PRN
  Administered 2024-05-30: 10 mg via INTRAVENOUS
  Filled 2024-05-30: qty 1

## 2024-05-30 MED ORDER — ONDANSETRON HCL 4 MG PO TABS: 4.0000 mg | ORAL_TABLET | Freq: Four times a day (QID) | ORAL | Status: DC | PRN

## 2024-05-30 MED ORDER — SODIUM CHLORIDE 0.9 % IV SOLN: INTRAVENOUS | Status: AC | PRN

## 2024-05-30 MED ORDER — ONDANSETRON HCL 4 MG/2ML IJ SOLN: 4.0000 mg | Freq: Four times a day (QID) | INTRAMUSCULAR | Status: DC | PRN

## 2024-05-30 MED ORDER — TRAZODONE HCL 50 MG PO TABS: 50.0000 mg | ORAL_TABLET | Freq: Every evening | ORAL | Status: DC | PRN

## 2024-05-30 MED ORDER — SODIUM CHLORIDE 0.9% FLUSH
3.0000 mL | Freq: Two times a day (BID) | INTRAVENOUS | Status: DC
  Administered 2024-05-30 – 2024-05-31 (×3): 3 mL via INTRAVENOUS

## 2024-05-30 MED ORDER — HEPARIN SODIUM (PORCINE) 5000 UNIT/ML IJ SOLN
5000.0000 [IU] | Freq: Three times a day (TID) | INTRAMUSCULAR | Status: DC
  Administered 2024-05-30 – 2024-05-31 (×3): 5000 [IU] via SUBCUTANEOUS
  Filled 2024-05-30 (×3): qty 1

## 2024-05-30 MED ORDER — SODIUM CHLORIDE 0.9% FLUSH: 3.0000 mL | INTRAVENOUS | Status: DC | PRN

## 2024-05-30 MED ORDER — ACETAMINOPHEN 325 MG PO TABS
650.0000 mg | ORAL_TABLET | Freq: Four times a day (QID) | ORAL | Status: DC | PRN
  Administered 2024-05-31: 650 mg via ORAL
  Filled 2024-05-30: qty 2

## 2024-05-30 MED ORDER — BISACODYL 10 MG RE SUPP: 10.0000 mg | Freq: Every day | RECTAL | Status: DC | PRN

## 2024-05-30 MED ORDER — SODIUM CHLORIDE 0.9 % IV BOLUS
1000.0000 mL | Freq: Once | INTRAVENOUS | Status: AC
  Administered 2024-05-30: 1000 mL via INTRAVENOUS

## 2024-05-30 MED ORDER — ASPIRIN 81 MG PO CHEW
81.0000 mg | CHEWABLE_TABLET | Freq: Every day | ORAL | Status: DC
  Administered 2024-05-31: 81 mg via ORAL
  Filled 2024-05-30: qty 1

## 2024-05-30 NOTE — ED Provider Notes (Signed)
 [place] EMERGENCY DEPARTMENT AT [facility] Provider Note   CSN: [id number] Arrival date & time: 05/30/24  [id number]     Patient presents with: Fall, Weakness, and Nausea   Crystal [name] is a 78 y.o. female.   Patient with a history of dementia and hypertension.  Patient has syncopal episode yesterday and hit her head and was seen in the emergency department.  She was sent home and then today she had another syncopal episode while in the restroom.  The history is provided by the patient and the nursing home. No language interpreter was used.  Fall This is a new problem. The current episode started 6 to 12 hours ago. The problem occurs constantly. The problem has been resolved. Pertinent negatives include no chest pain. Nothing aggravates the symptoms. Nothing relieves the symptoms.  Weakness Associated symptoms: no chest pain        Prior to Admission medications   Medication Sig Start Date End Date Taking? Authorizing Provider  acetaminophen  (TYLENOL ) 500 MG tablet Take 500 mg by mouth every 6 (six) hours as needed for mild pain (pain score 1-3) or headache.    [provider]  amLODipine  (NORVASC ) 10 MG tablet Take 1 tablet (10 mg total) by mouth daily. 04/16/24 04/16/25  Vernon Ranks, MD  aspirin  81 MG tablet Take 81 mg by mouth daily.    [provider]  polyethylene glycol (MIRALAX  / GLYCOLAX ) packet Take 17 g by mouth 3 (three) times a week. Reported on 04/02/2016    [provider]    Allergies: Patient has no known allergies.    Review of Systems  Unable to perform ROS: Dementia  Cardiovascular:  Negative for chest pain.  Neurological:  Positive for weakness.    Updated Vital Signs BP (!) 153/51   Pulse 71   Temp 97.9 F (36.6 C) (Oral)   Resp (!) 21   Ht 5' 3 (1.6 m)   Wt 36.3 kg   SpO2 99%   BMI 14.17 kg/m   Physical Exam Vitals and nursing note reviewed.  Constitutional:      Appearance: She is well-developed.   HENT:     Head: Normocephalic.     Nose: Nose normal.  Eyes:     General: No scleral icterus.    Conjunctiva/sclera: Conjunctivae normal.  Neck:     Thyroid : No thyromegaly.     Trachea: No tracheal deviation.  Cardiovascular:     Rate and Rhythm: Normal rate and regular rhythm.     Heart sounds: No murmur heard.    No friction rub. No gallop.  Pulmonary:     Breath sounds: No stridor. No wheezing or rales.  Chest:     Chest wall: No tenderness.  Abdominal:     General: There is no distension.     Tenderness: There is no abdominal tenderness. There is no rebound.  Musculoskeletal:        General: Normal range of motion.     Cervical back: Neck supple.  Lymphadenopathy:     Cervical: No cervical adenopathy.  Skin:    General: Skin is warm.     Findings: No erythema or rash.  Neurological:     Motor: No abnormal muscle tone.     Coordination: Coordination normal.     Comments: Oriented to person only  Psychiatric:        Behavior: Behavior normal.     (all labs ordered are listed, but only abnormal results are displayed)  Labs Reviewed  COMPREHENSIVE METABOLIC PANEL WITH GFR - Abnormal; Notable for the following components:      Result Value   Glucose, Bld 108 (*)    BUN 25 (*)    Total Bilirubin 1.3 (*)    All other components within normal limits  CBC WITH DIFFERENTIAL/PLATELET - Abnormal; Notable for the following components:   WBC 11.7 (*)    RBC 3.36 (*)    Hemoglobin 10.9 (*)    HCT 34.1 (*)    MCV 101.5 (*)    Neutro Abs 10.4 (*)    All other components within normal limits  CBG MONITORING, ED - Abnormal; Notable for the following components:   Glucose-Capillary 114 (*)    All other components within normal limits  URINALYSIS, ROUTINE W REFLEX MICROSCOPIC  CBG MONITORING, ED  TROPONIN I (HIGH SENSITIVITY)  TROPONIN I (HIGH SENSITIVITY)    EKG: None  Radiology: CT Cervical Spine Wo Contrast Result Date: 05/30/2024 EXAM: CT CERVICAL SPINE WITHOUT  CONTRAST 05/30/2024 08:46:30 AM TECHNIQUE: CT of the cervical spine was performed without the administration of intravenous contrast. Multiplanar reformatted images are provided for review. Automated exposure control, iterative reconstruction, and/or weight based adjustment of the mA/kV was utilized to reduce the radiation dose to as low as reasonably achievable. COMPARISON: CT cervical spine 04/17/2024. CLINICAL HISTORY: Neck trauma (Age >= 65y). Trauma; arrived via [facility] from [facility] for c/o fall this morning while on toilet. Also fell last night and went to [facility]. Weakness and nausea began after fall last night. Right eye bruised. CBG 130. FINDINGS: CERVICAL SPINE: BONES AND ALIGNMENT: No acute fracture or traumatic malalignment. DEGENERATIVE CHANGES: Slight degenerative retrolisthesis at C4-5 and stable. Chronic loss of disc height at C5-6 and C6-7 is stable. SOFT TISSUES: No prevertebral soft tissue swelling. VASCULATURE: Atherosclerotic calcifications are present at the carotid bifurcations bilaterally. IMPRESSION: 1. No acute abnormality of the cervical spine related to the reported trauma. 2. Stable slight degenerative retrolisthesis at C4-5 and chronic loss of disc height at C5-6 and C6-7. Electronically signed by: Lonni Necessary MD 05/30/2024 09:08 AM EDT RP Workstation: [id number]   CT Head Wo Contrast Result Date: 05/30/2024 CLINICAL DATA:  Fall from toilet.  Facial trauma. EXAM: CT HEAD WITHOUT CONTRAST CT MAXILLOFACIAL WITHOUT CONTRAST TECHNIQUE: Multidetector CT imaging of the head and maxillofacial structures were performed using the standard protocol without intravenous contrast. Multiplanar CT image reconstructions of the maxillofacial structures were also generated. RADIATION DOSE REDUCTION: This exam was performed according to the departmental dose-optimization program which includes automated exposure control, adjustment of the mA and/or kV according to patient size  and/or use of iterative reconstruction technique. COMPARISON:  Head CT 04/17/2024. FINDINGS: CT HEAD FINDINGS Brain: There is no evidence for acute hemorrhage, hydrocephalus, mass lesion, or abnormal extra-axial fluid collection. No definite CT evidence for acute infarction. Diffuse loss of parenchymal volume is consistent with atrophy. Patchy low attenuation in the deep hemispheric and periventricular white matter is nonspecific, but likely reflects chronic microvascular ischemic demyelination. Vascular: No hyperdense vessel or unexpected calcification. Skull: No evidence for fracture. No worrisome lytic or sclerotic lesion. Other: None. CT MAXILLOFACIAL FINDINGS Osseous: No fracture or mandibular dislocation. No destructive process. Rightward deviation of the nasal bones is stable since prior head CT consistent with age indeterminate but nonacute trauma. Orbits: Negative. No traumatic bony finding. Sinuses: Clear. Soft tissues: Right frontal scalp contusion noted with contusion/hematoma overlying the upper right orbital region. IMPRESSION: 1. No acute intracranial abnormality. Atrophy with  chronic small vessel white matter ischemic disease. 2. Right frontal scalp contusion with superficial contusion/hematoma overlying the upper right orbital region. No underlying fracture. 3. Rightward deviation of the nasal bones is stable since prior head CT consistent with age indeterminate but nonacute trauma. Electronically Signed   By: Camellia Candle M.D.   On: 05/30/2024 09:05   CT Maxillofacial Wo Contrast Result Date: 05/30/2024 CLINICAL DATA:  Fall from toilet.  Facial trauma. EXAM: CT HEAD WITHOUT CONTRAST CT MAXILLOFACIAL WITHOUT CONTRAST TECHNIQUE: Multidetector CT imaging of the head and maxillofacial structures were performed using the standard protocol without intravenous contrast. Multiplanar CT image reconstructions of the maxillofacial structures were also generated. RADIATION DOSE REDUCTION: This exam was  performed according to the departmental dose-optimization program which includes automated exposure control, adjustment of the mA and/or kV according to patient size and/or use of iterative reconstruction technique. COMPARISON:  Head CT 04/17/2024. FINDINGS: CT HEAD FINDINGS Brain: There is no evidence for acute hemorrhage, hydrocephalus, mass lesion, or abnormal extra-axial fluid collection. No definite CT evidence for acute infarction. Diffuse loss of parenchymal volume is consistent with atrophy. Patchy low attenuation in the deep hemispheric and periventricular white matter is nonspecific, but likely reflects chronic microvascular ischemic demyelination. Vascular: No hyperdense vessel or unexpected calcification. Skull: No evidence for fracture. No worrisome lytic or sclerotic lesion. Other: None. CT MAXILLOFACIAL FINDINGS Osseous: No fracture or mandibular dislocation. No destructive process. Rightward deviation of the nasal bones is stable since prior head CT consistent with age indeterminate but nonacute trauma. Orbits: Negative. No traumatic bony finding. Sinuses: Clear. Soft tissues: Right frontal scalp contusion noted with contusion/hematoma overlying the upper right orbital region. IMPRESSION: 1. No acute intracranial abnormality. Atrophy with chronic small vessel white matter ischemic disease. 2. Right frontal scalp contusion with superficial contusion/hematoma overlying the upper right orbital region. No underlying fracture. 3. Rightward deviation of the nasal bones is stable since prior head CT consistent with age indeterminate but nonacute trauma. Electronically Signed   By: Camellia Candle M.D.   On: 05/30/2024 09:05   DG Chest Port 1 View Result Date: 05/30/2024 CLINICAL DATA:  Fall. EXAM: PORTABLE CHEST 1 VIEW COMPARISON:  04/15/2024 FINDINGS: Hyperexpansion, as before. The lungs are clear without focal pneumonia, edema, pneumothorax or pleural effusion. Interstitial markings are diffusely  coarsened with chronic features. Stable scarring at the right base. Bones are diffusely demineralized. Telemetry leads overlie the chest. IMPRESSION: Hyperexpansion with chronic interstitial coarsening. No acute cardiopulmonary findings. Electronically Signed   By: Camellia Candle M.D.   On: 05/30/2024 08:25     Procedures   Medications Ordered in the ED  sodium chloride  0.9 % bolus 1,000 mL (0 mLs Intravenous Stopped 05/30/24 0957)                                    Medical Decision Making Amount and/or Complexity of Data Reviewed Labs: ordered. Radiology: ordered.  Risk Decision regarding hospitalization.   Syncope with unknown cause and dehydration.  Patient will be admitted to medicine     Final diagnoses:  Syncope and collapse    ED Discharge Orders     None          Suzette Pac, MD 05/30/24 1727

## 2024-05-30 NOTE — H&P (Addendum)
 History and Physical   Patient: Crystal [name] FMW:[id number] DOB: [birth year] DOA: 05/30/2024 DOS: the patient was seen and examined on 05/30/2024 PCP: Patient, No Pcp Per  Patient coming from: ALF/ILF---[facility]  Chief Complaint:  Chief Complaint  Patient presents with   Fall   Weakness   Nausea   HPI: Crystal [name] is a 78 y.o. female with medical history significant for dementia with significant memory deficit, history of prior CVA, history of breast cancer, HTN, GERD peripheral vascular disease depression/anxiety disorder who presents to the ED from [facility] after another fall - Additional history obtained from patient's son Marcey at bedside -Patient apparently had a fall on 05/29/2024 was sent to [facility] ER for evaluation and discharged back to ALF then fell again on 05/30/2024 and now presents - Review of records reveals multiple falls - CT head, CT cervical spine CT maxillofacial without significant acute findings--IMPRESSION: 1. No acute intracranial abnormality. Atrophy with chronic small vessel white matter ischemic disease. 2. Right frontal scalp contusion with superficial contusion/hematoma overlying the upper right orbital region. No underlying fracture. 3. Rightward deviation of the nasal bones is stable since prior head CT consistent with age indeterminate but nonacute trauma. - Chest x-ray without acute findings -Initial troponin 5 repeat troponin is 6 - EKG sinus rhythm without acute findings - UA unremarkable - WBC 11.7 hemoglobin 10.9 - Creatinine 0.76  Review of Systems: As mentioned in the history of present illness. All other systems reviewed and are negative. Past Medical History:  Diagnosis Date   Abdominal aortic ectasia (HCC) 01/31/2010   Qualifier: Diagnosis of  By: Harlow MD, Ozell BRAVO    Adjustment disorder with depressed mood    Anxiety    Breast cancer (HCC) 03/11/12   right breast lumpectomy=invasive lobular ca,grade I/III,ER?PR=positive    CEREBROVASCULAR DISEASE 06/16/2010   Qualifier: Diagnosis of  By: Norleen MD, Lynwood ORN    CONSTIPATION 01/12/2009   Qualifier: Diagnosis of  By: Genie CMA (AAMA), Leisha     Eosinophilic esophagitis    ESOPHAGEAL STRICTURE 01/12/2009   Qualifier: Diagnosis of  By: Genie CMA (AAMA), Leisha     EXTERNAL HEMORRHOIDS 01/12/2009   Qualifier: Diagnosis of  By: Genie CMA (AAMA), Leisha     GERD 05/10/2007   Qualifier: Diagnosis of  By: Wilhemina RMA, Lucy     HYPERLIPIDEMIA 05/10/2007   Qualifier: Diagnosis of  By: Wilhemina KRAFT, Lucy     HYPERTENSION 05/10/2007   Qualifier: Diagnosis of  By: Wilhemina RMA, Lucy     IBS (irritable bowel syndrome)    Incontinence    Irritable bowel syndrome 01/12/2009   Qualifier: Diagnosis of  By: Genie CMA (AAMA), Leisha     Melanosis    Orthostatic hypotension    Overactive detrusor    PERIPHERAL VASCULAR DISEASE 06/17/2010   Qualifier: Diagnosis of  By: Norleen MD, Lynwood ORN    Personal history of radiation therapy 2013   Radiation 04/21/2012-06/11/2012   64 gray right breast   RECTAL FISSURE 02/16/2009   Qualifier: Diagnosis of  By: Genie CMA (AAMA), Leisha     Rectal stenosis    STENOSIS, RECTAL 02/16/2009   Qualifier: Diagnosis of  By: Genie CMA (AAMA), Chick     Tachyarrhythmia 09/03/2009   Qualifier: Diagnosis of  By: Harlow MD, Ozell BRAVO    TRANSIENT ISCHEMIC ATTACK 03/02/2010   Qualifier: Diagnosis of  By: Harlow MD, Ozell BRAVO    VENOUS INSUFFICIENCY, LEGS 07/18/2010   Qualifier: Diagnosis of  By:  Norins MD, Ozell BRAVO    Past Surgical History:  Procedure Laterality Date   ABDOMINAL HYSTERECTOMY     BREAST BIOPSY Right 2014   BREAST BIOPSY Right 2013   malignant   BREAST EXCISIONAL BIOPSY Left 1972   BREAST LUMPECTOMY Right 2013   BREAST SURGERY Left    left breast x 2   cardiac catherization     CARDIAC CATHETERIZATION     edg  04/21/2007   ELECTROCARDIOGRAM  10/30/2006   esophageal tumor excised; posterior 1986     hemmorhoidectomy     x 3   Social  History:  reports that she quit smoking about 58 years ago. Her smoking use included cigarettes. She has never used smokeless tobacco. She reports current alcohol use of about 3.0 standard drinks of alcohol per week. She reports that she does not use drugs.  No Known Allergies  Family History  Problem Relation Age of Onset   Bone cancer Father    Heart disease Brother        MI   Aneurysm Brother    Colon cancer Neg Hx    Breast cancer Neg Hx    Diabetes Neg Hx    Anesthesia problems Neg Hx     Prior to Admission medications   Medication Sig Start Date End Date Taking? Authorizing Provider  acetaminophen  (TYLENOL ) 500 MG tablet Take 500 mg by mouth every 6 (six) hours as needed for mild pain (pain score 1-3).   Yes [provider]  amLODipine  (NORVASC ) 10 MG tablet Take 1 tablet (10 mg total) by mouth daily. 04/16/24 04/16/25 Yes Pahwani, Fredia, MD  aspirin  81 MG tablet Take 81 mg by mouth daily.   Yes [provider]  Cholecalciferol (VITAMIN D3) 50 MCG (2000 UT) capsule Take 2,000 Units by mouth daily.   Yes [provider]  lactose free nutrition (BOOST) LIQD Take 237 mLs by mouth daily. Vanilla   Yes [provider]  polyethylene glycol (MIRALAX  / GLYCOLAX ) packet Take 17 g by mouth 3 (three) times a week. Monday, Wednesday, and Saturday   Yes [provider]    Physical Exam: Vitals:   05/30/24 1100 05/30/24 1144 05/30/24 1457 05/30/24 1607  BP: (!) 113/48 (!) 166/55 (!) 141/62 (!) 120/53  Pulse: (!) 57 72  73  Resp: 15   20  Temp:  97.7 F (36.5 C)  97.8 F (36.6 C)  TempSrc:  Oral  Oral  SpO2: 99% 100%  100%  Weight:  35.9 kg    Height:  5' 3 (1.6 m)      Physical Exam Gen:- Awake Alert, in no acute distress  HEENT:- [place].AT, No sclera icterus Neck-Supple Neck,No JVD,.  Lungs-  CTAB , fair air movement bilaterally  CV- S1, S2 normal, RRR Abd-  +ve B.Sounds, Abd Soft, No tenderness,    Extremity/Skin:- No  edema,   good  pedal pulses  Psych-affect is anxious, obvious cognitive and memory deficits Neuro-generalized weakness, No New focal deficits, no tremors  Data Reviewed: CT head, CT cervical spine CT maxillofacial without significant acute findings--IMPRESSION: 1. No acute intracranial abnormality. Atrophy with chronic small vessel white matter ischemic disease. 2. Right frontal scalp contusion with superficial contusion/hematoma overlying the upper right orbital region. No underlying fracture. 3. Rightward deviation of the nasal bones is stable since prior head CT consistent with age indeterminate but nonacute trauma. -Chest x-ray without acute findings -Initial troponin 5 repeat troponin is 6 - EKG sinus rhythm without acute  findings - UA unremarkable - WBC 11.7 hemoglobin 10.9 - Creatinine 0.76  Assessment and Plan: 1)Recurrent Falls/??? Syncope- -CT head, CT cervical spine CT maxillofacial without New significant acute findings -admit to telemetry monitored unit, watch for arrhythmias,  -Ruled out for ACS by serial troponins and EKG -Echocardiogram from 05/30/24-- w/o  significant aortic stenosis or other outflow obstruction, No Mitral Stenosis, EF is 60 to 65 % and w/o segmental/Regional wall motion abnormalities - Check carotid artery Dopplers to rule out hemodynamically significant stenosis Check am cortisol level UA---wnl Phys Therapy--eval requested -Fall Precautions  2)Chronic Anemia -- Hgb >>10.9   3)Breast cancer of lower-inner quadrant of right female breast (HCC) 2013.  Status post lumpectomy, radiation.  Currently not on medications.  4)HTN----give Amlodipine  - IV hydralazine  as needed  5)Dementia/Anxiety---- with behavioral and memory deficits - Xanax  as needed for anxiety - Trazodone  nightly   Advance Care Planning:   Code Status: Limited: Do not attempt resuscitation (DNR) -DNR-LIMITED -Do Not Intubate/DNI    Family Communication: Discussed with Son Marcey at  bedside  Severity of Illness: The appropriate patient status for this patient is OBSERVATION. Observation status is judged to be reasonable and necessary in order to provide the required intensity of service to ensure the patient's safety. The patient's presenting symptoms, physical exam findings, and initial radiographic and laboratory data in the context of their medical condition is felt to place them at decreased risk for further clinical deterioration. Furthermore, it is anticipated that the patient will be medically stable for discharge from the hospital within 2 midnights of admission.   Author: Rendall Carwin, MD 05/30/2024 5:47 PM  For on call review [email or web address].

## 2024-05-30 NOTE — Progress Notes (Signed)
 Mobility Specialist Progress Note:    05/30/24 1220  Mobility  Activity Ambulated with assistance  Level of Assistance Modified independent, requires aide device or extra time  Assistive Device None  Distance Ambulated (ft) 20 ft  Range of Motion/Exercises Active;All extremities  Activity Response Tolerated well  Mobility Referral Yes  Mobility visit 1 Mobility  Mobility Specialist Start Time (ACUTE ONLY) 1220  Mobility Specialist Stop Time (ACUTE ONLY) 1235  Mobility Specialist Time Calculation (min) (ACUTE ONLY) 15 min   Pt received in bed, requesting assistance to bathroom. ModI to stand and ambulate with no AD. Tolerated well,asx throughout. Returned supine, alarm on. All needs met.  Crystal [name] Mobility Specialist Please contact via Special educational needs teacher or  Rehab office at [phone]

## 2024-05-30 NOTE — ED Triage Notes (Signed)
 Patient arrived via [facility] from [facility] for c/o fall this morning while on toilet. Also fell last night and went to [facility]. Weakness and nausea began after fall last night. Right eye bruised. CBG 130

## 2024-05-30 NOTE — Progress Notes (Signed)
 Patient has been up multiple times since arrival to the unit. We moved patient closer to the nurses station and notified patient's son that she was on our unit. Have notified primary care nurse & physician that patient continues to need redirection.

## 2024-05-30 NOTE — ED Notes (Signed)
 [id number] Off floor to CT

## 2024-05-31 ENCOUNTER — Observation Stay (HOSPITAL_COMMUNITY)

## 2024-05-31 DIAGNOSIS — R55 Syncope and collapse: Secondary | ICD-10-CM | POA: Diagnosis not present

## 2024-05-31 LAB — CORTISOL-AM, BLOOD: Cortisol - AM: 12.5 ug/dL (ref 6.7–22.6)

## 2024-05-31 MED ORDER — ASPIRIN 81 MG PO TABS: 81.0000 mg | ORAL_TABLET | Freq: Every day | ORAL | 11 refills | Status: AC

## 2024-05-31 MED ORDER — TRAZODONE HCL 50 MG PO TABS: 50.0000 mg | ORAL_TABLET | Freq: Every day | ORAL | 1 refills | Status: AC

## 2024-05-31 MED ORDER — POLYETHYLENE GLYCOL 3350 17 G PO PACK: 17.0000 g | PACK | ORAL | 5 refills | Status: AC

## 2024-05-31 MED ORDER — AMLODIPINE BESYLATE 5 MG PO TABS: 5.0000 mg | ORAL_TABLET | Freq: Every day | ORAL | 11 refills | Status: AC

## 2024-05-31 NOTE — Discharge Instructions (Addendum)
 1)Very Low-salt diet advised---Less than 2 gm of Sodium per day advised----ok to use Mrs DASH salt substitute instead of Salt 2)Fall Precautions --ambulate with walker 3)Reduce Amlodipine  to 5 mg daily    Rolling walker to be delivered by [employer] [phone] )  [facility] to provide physical therapy and will contact you to schedule a time. Hedda (home health nurse) [facility] [address], [place], [place], [address] [phone]

## 2024-05-31 NOTE — Plan of Care (Signed)
   Problem: Education: Goal: Knowledge of General Education information will improve Description Including pain rating scale, medication(s)/side effects and non-pharmacologic comfort measures Outcome: Progressing   Problem: Health Behavior/Discharge Planning: Goal: Ability to manage health-related needs will improve Outcome: Progressing

## 2024-05-31 NOTE — TOC Transition Note (Signed)
 Transition of Care [facility]) - Discharge Note  Patient Details  Name: Crystal [name] MRN: [id number] Date of Birth: 11/04/1945  Transition of Care [facility]) CM/SW Contact:  Nena LITTIE Coffee, RN Phone Number: 05/31/2024, 2:43 PM  Clinical Narrative:    Pt to return to [facility] [place] c/HHPT provided by [facility]. [employer] is to deliver a rollator to [facility].   Final next level of care: Assisted Living [facility] [place]) Barriers to Discharge: Barriers Resolved  Patient Goals and CMS Choice   Discharge Placement   Discharge Plan and Services Additional resources added to the After Visit Summary for          DME Arranged: Walker rolling DME Agency: [employer] Date DME Agency Contacted: 05/31/24   Representative spoke with at DME Agency: [facility] Arranged: PT [facility] Agency: [facility] Date [facility] Agency Contacted: 05/31/24   Representative spoke with at [facility] Agency: Darleene  Social Drivers of Health (SDOH) Interventions SDOH Screenings   Food Insecurity: No Food Insecurity (05/30/2024)  Housing: Unknown (05/30/2024)  Transportation Needs: No Transportation Needs (05/30/2024)  Utilities: Not At Risk (05/30/2024)  Depression (PHQ2-9): Low Risk  (02/09/2020)  Financial Resource Strain: Low Risk  (05/22/2018)  Physical Activity: Inactive (05/22/2018)  Social Connections: Moderately Isolated (05/30/2024)  Stress: No Stress Concern Present (05/22/2018)  Tobacco Use: Medium Risk (05/30/2024)   Readmission Risk Interventions     No data to display

## 2024-05-31 NOTE — Care Management Obs Status (Signed)
 MEDICARE OBSERVATION STATUS NOTIFICATION   Patient Details  Name: Crystal [name] MRN: [id number] Date of Birth: 1946-05-20   Medicare Observation Status Notification Given:  Yes    Nena LITTIE Coffee, RN 05/31/2024, 12:05 PM

## 2024-05-31 NOTE — Progress Notes (Signed)
   05/31/24 1326  TOC Brief Assessment  Insurance and Status Reviewed  Patient has primary care physician Yes  Home environment has been reviewed [place] [facility]  Prior level of function: Monitored, mostly independent  Prior/Current Home Services No current home services  Social Drivers of Health Review SDOH reviewed no interventions necessary  Readmission risk has been reviewed Yes  Transition of care needs no transition of care needs at this time   Pt has been a resident of [place] [facility] for approximately one month. Prior to [facility] she was at [facility] for short-term rehab after sustaining injuries/weakness from multiple falls at home where she lived alone.

## 2024-05-31 NOTE — Plan of Care (Signed)

## 2024-05-31 NOTE — Discharge Summary (Signed)
 Crystal [name], is a 78 y.o. female  DOB [birth year]  MRN [id number].  Admission date:  05/30/2024  Admitting Physician  Rendall Carwin, MD  Discharge Date:  05/31/2024   Primary MD  Patient, No Pcp Per  Recommendations for primary care physician for things to follow:  1)Very Low-salt diet advised---Less than 2 gm of Sodium per day advised----ok to use Mrs DASH salt substitute instead of Salt 2)Fall Precautions --ambulate with walker 3)Reduce Amlodipine  to 5 mg daily  Admission Diagnosis  Syncope and collapse [R55]  Discharge Diagnosis  Syncope and collapse [R55]    Principal Problem:   Syncope and collapse Active Problems:   Multiple falls   Hyperlipidemia   Essential hypertension   Cerebrovascular disease   Peripheral vascular disease (HCC)   Breast cancer of lower-inner quadrant of right female breast [facility])      Past Medical History:  Diagnosis Date   Abdominal aortic ectasia (HCC) 01/31/2010   Qualifier: Diagnosis of  By: Harlow MD, Ozell BRAVO    Adjustment disorder with depressed mood    Anxiety    Breast cancer (HCC) 03/11/12   right breast lumpectomy=invasive lobular ca,grade I/III,ER?PR=positive   CEREBROVASCULAR DISEASE 06/16/2010   Qualifier: Diagnosis of  By: Norleen MD, Lynwood ORN    CONSTIPATION 01/12/2009   Qualifier: Diagnosis of  By: Genie CMA (AAMA), Leisha     Eosinophilic esophagitis    ESOPHAGEAL STRICTURE 01/12/2009   Qualifier: Diagnosis of  By: Genie CMA (AAMA), Leisha     EXTERNAL HEMORRHOIDS 01/12/2009   Qualifier: Diagnosis of  By: Genie CMA (AAMA), Leisha     GERD 05/10/2007   Qualifier: Diagnosis of  By: Wilhemina RMA, Lucy     HYPERLIPIDEMIA 05/10/2007   Qualifier: Diagnosis of  By: Wilhemina KRAFT, Lucy     HYPERTENSION 05/10/2007   Qualifier: Diagnosis of  By: Wilhemina RMA, Lucy     IBS (irritable bowel syndrome)    Incontinence    Irritable bowel syndrome 01/12/2009    Qualifier: Diagnosis of  By: Genie CMA (AAMA), Leisha     Melanosis    Orthostatic hypotension    Overactive detrusor    PERIPHERAL VASCULAR DISEASE 06/17/2010   Qualifier: Diagnosis of  By: Norleen MD, Lynwood ORN    Personal history of radiation therapy 2013   Radiation 04/21/2012-06/11/2012   64 gray right breast   RECTAL FISSURE 02/16/2009   Qualifier: Diagnosis of  By: Genie CMA (AAMA), Leisha     Rectal stenosis    STENOSIS, RECTAL 02/16/2009   Qualifier: Diagnosis of  By: Genie CMA (AAMA), Chick     Tachyarrhythmia 09/03/2009   Qualifier: Diagnosis of  By: Harlow MD, Ozell BRAVO    TRANSIENT ISCHEMIC ATTACK 03/02/2010   Qualifier: Diagnosis of  By: Harlow MD, Ozell BRAVO    VENOUS INSUFFICIENCY, LEGS 07/18/2010   Qualifier: Diagnosis of  By: Harlow MD, Ozell BRAVO     Past Surgical History:  Procedure Laterality Date   ABDOMINAL HYSTERECTOMY     BREAST BIOPSY Right 2014  BREAST BIOPSY Right 2013   malignant   BREAST EXCISIONAL BIOPSY Left 1972   BREAST LUMPECTOMY Right 2013   BREAST SURGERY Left    left breast x 2   cardiac catherization     CARDIAC CATHETERIZATION     edg  04/21/2007   ELECTROCARDIOGRAM  10/30/2006   esophageal tumor excised; posterior 1986     hemmorhoidectomy     x 3    HPI  from the history and physical done on the day of admission:     HPI: Crystal [name] is a 78 y.o. female with medical history significant for dementia with significant memory deficit, history of prior CVA, history of breast cancer, HTN, GERD peripheral vascular disease depression/anxiety disorder who presents to the ED from [facility] after another fall - Additional history obtained from patient's son Marcey at bedside -Patient apparently had a fall on 05/29/2024 was sent to [facility] ER for evaluation and discharged back to ALF then fell again on 05/30/2024 and now presents - Review of records reveals multiple falls - CT head, CT cervical spine CT maxillofacial without significant acute  findings--IMPRESSION: 1. No acute intracranial abnormality. Atrophy with chronic small vessel white matter ischemic disease. 2. Right frontal scalp contusion with superficial contusion/hematoma overlying the upper right orbital region. No underlying fracture. 3. Rightward deviation of the nasal bones is stable since prior head CT consistent with age indeterminate but nonacute trauma. - Chest x-ray without acute findings -Initial troponin 5 repeat troponin is 6 - EKG sinus rhythm without acute findings - UA unremarkable - WBC 11.7 hemoglobin 10.9 - Creatinine 0.76   Review of Systems: As mentioned in the history of present illness. All other systems reviewed and are negative.    Hospital Course:     Assessment and Plan: 1)Recurrent Falls/??? Syncope- -CT head, CT cervical spine CT maxillofacial without New significant acute findings -On Telemetry monitored unit no significant arrhythmias,  -Ruled out for ACS by serial troponins and EKG -Echocardiogram from 05/30/24-- w/o  significant aortic stenosis or other outflow obstruction, No Mitral Stenosis, EF is 60 to 65 % and w/o segmental/Regional wall motion abnormalities - Carotid artery Dopplers without hemodynamically significant stenosis -AM cortisol level is 12.5 UA---wnl Phys Therapy--eval appreciated -Fall Precautions - Discharged with home health PT and walker   2)Chronic Anemia -- Hgb >>10.9   3)Breast cancer of lower-inner quadrant of right female breast (HCC) 2013.  Status post lumpectomy, radiation.  Currently not on medications.   4)HTN----reduce amlodipine  to 5 mg daily   5)Dementia/Anxiety---- with behavioral and memory deficits - Xanax  as needed for anxiety - Trazodone  nightly  Discharge Condition: stable  Diet and Activity recommendation:  As advised  Discharge Instructions    Discharge Instructions     Call MD for:  difficulty breathing, headache or visual disturbances   Complete by: As directed     Call MD for:  persistant dizziness or light-headedness   Complete by: As directed    Call MD for:  persistant nausea and vomiting   Complete by: As directed    Call MD for:  temperature >100.4   Complete by: As directed    Diet - low sodium heart healthy   Complete by: As directed    Discharge instructions   Complete by: As directed    1)Very Low-salt diet advised---Less than 2 gm of Sodium per day advised----ok to use Mrs DASH salt substitute instead of Salt 2)Fall Precautions --ambulate with walker 3)Reduce Amlodipine  to 5 mg daily  Increase activity slowly   Complete by: As directed        Discharge Medications     Allergies as of 05/31/2024   No Known Allergies      Medication List     TAKE these medications    acetaminophen  500 MG tablet Commonly known as: TYLENOL  Take 500 mg by mouth every 6 (six) hours as needed for mild pain (pain score 1-3).   amLODipine  5 MG tablet Commonly known as: NORVASC  Take 1 tablet (5 mg total) by mouth daily. What changed:  medication strength how much to take   aspirin  81 MG tablet Take 1 tablet (81 mg total) by mouth daily with breakfast. What changed: when to take this   lactose free nutrition Liqd Take 237 mLs by mouth daily. Vanilla   polyethylene glycol 17 g packet Commonly known as: MIRALAX  / GLYCOLAX  Take 17 g by mouth every Monday, Wednesday, and Friday. Monday, Wednesday, and Saturday Start taking on: June 01, 2024 What changed: when to take this   traZODone  50 MG tablet Commonly known as: DESYREL  Take 1 tablet (50 mg total) by mouth at bedtime.   Vitamin D3 50 MCG (2000 UT) capsule Take 2,000 Units by mouth daily.        Major procedures and Radiology Reports - PLEASE review detailed and final reports for all details, in brief -   US  Carotid Bilateral Result Date: 05/31/2024 CLINICAL DATA:  Syncope and collapse. EXAM: BILATERAL CAROTID DUPLEX ULTRASOUND TECHNIQUE: Elnor scale imaging, color Doppler and  duplex ultrasound were performed of bilateral carotid and vertebral arteries in the neck. COMPARISON:  None Available. FINDINGS: Criteria: Quantification of carotid stenosis is based on velocity parameters that correlate the residual internal carotid diameter with NASCET-based stenosis levels, using the diameter of the distal internal carotid lumen as the denominator for stenosis measurement. The following velocity measurements were obtained: RIGHT ICA: 85/18 cm/sec CCA: 82/13 cm/sec SYSTOLIC ICA/CCA RATIO:  1.0 ECA: 130 cm/sec LEFT ICA: 151/35 cm/sec CCA: 88/7 cm/sec SYSTOLIC ICA/CCA RATIO:  1.7 ECA: 133 cm/sec RIGHT CAROTID ARTERY: Echogenic plaque at the right carotid bulb. External carotid artery is patent with normal waveform. Echogenic and heterogeneous plaque in the proximal internal carotid artery. Normal waveforms and velocities in the internal carotid artery. RIGHT VERTEBRAL ARTERY: Antegrade flow and normal waveform in the right vertebral artery. LEFT CAROTID ARTERY: Echogenic plaque at the left carotid bulb. Peak systolic velocity at the left carotid bulb is 150 cm/sec. External carotid artery is patent with normal waveform. Peak systolic velocity in the proximal internal carotid artery is slightly elevated measuring 151 cm/sec. LEFT VERTEBRAL ARTERY: Antegrade flow and normal waveform in the left vertebral artery. IMPRESSION: 1. Atherosclerotic plaque involving bilateral carotid arteries. 2. Estimated degree of stenosis in left internal carotid artery measures between 50-69%. 3. Estimated degree of stenosis in the right internal carotid artery is less than 50%. 4. Patent vertebral arteries with antegrade flow. Electronically Signed   By: Juliene Balder M.D.   On: 05/31/2024 12:19   ECHOCARDIOGRAM COMPLETE Result Date: 05/30/2024    ECHOCARDIOGRAM REPORT   Patient Name:   Crystal [name] Date of Exam: 05/30/2024 Medical Rec #:  [id number]          Height:       63.0 in Accession #:    [id number]          Weight:       79.1 lb Date of Birth:  1946-08-22  BSA:          1.304 m Patient Age:    78 years           BP:           121/47 mmHg Patient Gender: F                  HR:           61 bpm. Exam Location:  [facility] Procedure: 2D Echo, 3D Echo, Cardiac Doppler and Color Doppler (Both Spectral            and Color Flow Doppler were utilized during procedure). Indications:    Syncope  History:        Patient has no prior history of Echocardiogram examinations.                 TIA, Signs/Symptoms:Syncope; Risk Factors:Former Smoker,                 Hypertension and Dyslipidemia. Breast Cancer.  Sonographer:    Orvil Holmes RDCS Referring Phys: [id number] [facility]  Sonographer Comments: Image acquisition challenging due to patient body habitus. IMPRESSIONS  1. Left ventricular ejection fraction, by estimation, is 60 to 65%. The left ventricle has normal function. The left ventricle has no regional wall motion abnormalities. Left ventricular diastolic parameters are indeterminate.  2. Right ventricular systolic function is normal. The right ventricular size is normal. Tricuspid regurgitation signal is inadequate for assessing PA pressure.  3. The mitral valve is normal in structure. No evidence of mitral valve regurgitation. No evidence of mitral stenosis.  4. The aortic valve is tricuspid. There is mild calcification of the aortic valve. There is mild thickening of the aortic valve. Aortic valve regurgitation is moderate. No aortic stenosis is present.  5. The inferior vena cava is normal in size with greater than 50% respiratory variability, suggesting right atrial pressure of 3 mmHg. FINDINGS  Left Ventricle: Left ventricular ejection fraction, by estimation, is 60 to 65%. The left ventricle has normal function. The left ventricle has no regional wall motion abnormalities. The left ventricular internal cavity size was normal in size. There is  no left ventricular hypertrophy. Left ventricular diastolic  parameters are indeterminate. Right Ventricle: The right ventricular size is normal. Right vetricular wall thickness was not well visualized. Right ventricular systolic function is normal. Tricuspid regurgitation signal is inadequate for assessing PA pressure. Left Atrium: Left atrial size was normal in size. Right Atrium: Right atrial size was normal in size. Pericardium: There is no evidence of pericardial effusion. Mitral Valve: The mitral valve is normal in structure. No evidence of mitral valve regurgitation. No evidence of mitral valve stenosis. Tricuspid Valve: The tricuspid valve is not well visualized. Tricuspid valve regurgitation is not demonstrated. No evidence of tricuspid stenosis. Aortic Valve: The aortic valve is tricuspid. There is mild calcification of the aortic valve. There is mild thickening of the aortic valve. Aortic valve regurgitation is moderate. Aortic regurgitation PHT measures 350 msec. No aortic stenosis is present.  Aortic valve mean gradient measures 1.7 mmHg. Aortic valve peak gradient measures 3.3 mmHg. Aortic valve area, by VTI measures 1.82 cm. Pulmonic Valve: The pulmonic valve was not well visualized. Pulmonic valve regurgitation is mild. No evidence of pulmonic stenosis. Aorta: The aortic root and ascending aorta are structurally normal, with no evidence of dilitation. Venous: The inferior vena cava is normal in size with greater than 50% respiratory variability, suggesting right atrial pressure of 3  mmHg. IAS/Shunts: No atrial level shunt detected by color flow Doppler.  LEFT VENTRICLE PLAX 2D LVIDd:         3.30 cm   Diastology LVIDs:         1.70 cm   LV e' lateral:   10.20 cm/s LV PW:         0.90 cm   LV E/e' lateral: 0.6 LV IVS:        0.90 cm LVOT diam:     1.75 cm LV SV:         40 LV SV Index:   30 LVOT Area:     2.41 cm  RIGHT VENTRICLE RV Basal diam:  3.00 cm RV Mid diam:    2.00 cm RV S prime:     12.90 cm/s TAPSE (M-mode): 2.6 cm LEFT ATRIUM           Index LA  diam:      2.40 cm 1.84 cm/m LA Vol (A4C): 11.8 ml 9.05 ml/m  AORTIC VALVE AV Area (Vmax):    1.75 cm AV Area (Vmean):   1.74 cm AV Area (VTI):     1.82 cm AV Vmax:           91.44 cm/s AV Vmean:          61.208 cm/s AV VTI:            0.218 m AV Peak Grad:      3.3 mmHg AV Mean Grad:      1.7 mmHg LVOT Vmax:         66.50 cm/s LVOT Vmean:        44.300 cm/s LVOT VTI:          0.165 m LVOT/AV VTI ratio: 0.76 AI PHT:            350 msec  AORTA Ao Root diam: 2.70 cm Ao Asc diam:  3.20 cm MV E velocity: 5.87 cm/s                           SHUNTS                           Systemic VTI:  0.16 m                           Systemic Diam: 1.75 cm Dorn Ross MD Electronically signed by Dorn Ross MD Signature Date/Time: 05/30/2024/2:30:33 PM    Final    CT Cervical Spine Wo Contrast Result Date: 05/30/2024 EXAM: CT CERVICAL SPINE WITHOUT CONTRAST 05/30/2024 08:46:30 AM TECHNIQUE: CT of the cervical spine was performed without the administration of intravenous contrast. Multiplanar reformatted images are provided for review. Automated exposure control, iterative reconstruction, and/or weight based adjustment of the mA/kV was utilized to reduce the radiation dose to as low as reasonably achievable. COMPARISON: CT cervical spine 04/17/2024. CLINICAL HISTORY: Neck trauma (Age >= 65y). Trauma; arrived via [facility] from [facility] for c/o fall this morning while on toilet. Also fell last night and went to [facility]. Weakness and nausea began after fall last night. Right eye bruised. CBG 130. FINDINGS: CERVICAL SPINE: BONES AND ALIGNMENT: No acute fracture or traumatic malalignment. DEGENERATIVE CHANGES: Slight degenerative retrolisthesis at C4-5 and stable. Chronic loss of disc height at C5-6 and C6-7 is stable. SOFT TISSUES: No prevertebral soft tissue swelling. VASCULATURE: Atherosclerotic calcifications are  present at the carotid bifurcations bilaterally. IMPRESSION: 1. No acute abnormality of the  cervical spine related to the reported trauma. 2. Stable slight degenerative retrolisthesis at C4-5 and chronic loss of disc height at C5-6 and C6-7. Electronically signed by: Lonni Necessary MD 05/30/2024 09:08 AM EDT RP Workstation: [id number]   CT Head Wo Contrast Result Date: 05/30/2024 CLINICAL DATA:  Fall from toilet.  Facial trauma. EXAM: CT HEAD WITHOUT CONTRAST CT MAXILLOFACIAL WITHOUT CONTRAST TECHNIQUE: Multidetector CT imaging of the head and maxillofacial structures were performed using the standard protocol without intravenous contrast. Multiplanar CT image reconstructions of the maxillofacial structures were also generated. RADIATION DOSE REDUCTION: This exam was performed according to the departmental dose-optimization program which includes automated exposure control, adjustment of the mA and/or kV according to patient size and/or use of iterative reconstruction technique. COMPARISON:  Head CT 04/17/2024. FINDINGS: CT HEAD FINDINGS Brain: There is no evidence for acute hemorrhage, hydrocephalus, mass lesion, or abnormal extra-axial fluid collection. No definite CT evidence for acute infarction. Diffuse loss of parenchymal volume is consistent with atrophy. Patchy low attenuation in the deep hemispheric and periventricular white matter is nonspecific, but likely reflects chronic microvascular ischemic demyelination. Vascular: No hyperdense vessel or unexpected calcification. Skull: No evidence for fracture. No worrisome lytic or sclerotic lesion. Other: None. CT MAXILLOFACIAL FINDINGS Osseous: No fracture or mandibular dislocation. No destructive process. Rightward deviation of the nasal bones is stable since prior head CT consistent with age indeterminate but nonacute trauma. Orbits: Negative. No traumatic bony finding. Sinuses: Clear. Soft tissues: Right frontal scalp contusion noted with contusion/hematoma overlying the upper right orbital region. IMPRESSION: 1. No acute intracranial  abnormality. Atrophy with chronic small vessel white matter ischemic disease. 2. Right frontal scalp contusion with superficial contusion/hematoma overlying the upper right orbital region. No underlying fracture. 3. Rightward deviation of the nasal bones is stable since prior head CT consistent with age indeterminate but nonacute trauma. Electronically Signed   By: Camellia Candle M.D.   On: 05/30/2024 09:05   CT Maxillofacial Wo Contrast Result Date: 05/30/2024 CLINICAL DATA:  Fall from toilet.  Facial trauma. EXAM: CT HEAD WITHOUT CONTRAST CT MAXILLOFACIAL WITHOUT CONTRAST TECHNIQUE: Multidetector CT imaging of the head and maxillofacial structures were performed using the standard protocol without intravenous contrast. Multiplanar CT image reconstructions of the maxillofacial structures were also generated. RADIATION DOSE REDUCTION: This exam was performed according to the departmental dose-optimization program which includes automated exposure control, adjustment of the mA and/or kV according to patient size and/or use of iterative reconstruction technique. COMPARISON:  Head CT 04/17/2024. FINDINGS: CT HEAD FINDINGS Brain: There is no evidence for acute hemorrhage, hydrocephalus, mass lesion, or abnormal extra-axial fluid collection. No definite CT evidence for acute infarction. Diffuse loss of parenchymal volume is consistent with atrophy. Patchy low attenuation in the deep hemispheric and periventricular white matter is nonspecific, but likely reflects chronic microvascular ischemic demyelination. Vascular: No hyperdense vessel or unexpected calcification. Skull: No evidence for fracture. No worrisome lytic or sclerotic lesion. Other: None. CT MAXILLOFACIAL FINDINGS Osseous: No fracture or mandibular dislocation. No destructive process. Rightward deviation of the nasal bones is stable since prior head CT consistent with age indeterminate but nonacute trauma. Orbits: Negative. No traumatic bony finding.  Sinuses: Clear. Soft tissues: Right frontal scalp contusion noted with contusion/hematoma overlying the upper right orbital region. IMPRESSION: 1. No acute intracranial abnormality. Atrophy with chronic small vessel white matter ischemic disease. 2. Right frontal scalp contusion with superficial contusion/hematoma overlying the upper right orbital region.  No underlying fracture. 3. Rightward deviation of the nasal bones is stable since prior head CT consistent with age indeterminate but nonacute trauma. Electronically Signed   By: Camellia Candle M.D.   On: 05/30/2024 09:05   DG Chest Port 1 View Result Date: 05/30/2024 CLINICAL DATA:  Fall. EXAM: PORTABLE CHEST 1 VIEW COMPARISON:  04/15/2024 FINDINGS: Hyperexpansion, as before. The lungs are clear without focal pneumonia, edema, pneumothorax or pleural effusion. Interstitial markings are diffusely coarsened with chronic features. Stable scarring at the right base. Bones are diffusely demineralized. Telemetry leads overlie the chest. IMPRESSION: Hyperexpansion with chronic interstitial coarsening. No acute cardiopulmonary findings. Electronically Signed   By: Camellia Candle M.D.   On: 05/30/2024 08:25   Today   Subjective    Crystal [name] today has no new complaints '- Son at bedside --questions answered   Patient has been seen and examined prior to discharge   Objective   Blood pressure (!) 129/51, pulse 76, temperature 100.1 F (37.8 C), temperature source Oral, resp. rate 16, height 5' 3 (1.6 m), weight 35.9 kg, SpO2 98%.   Intake/Output Summary (Last 24 hours) at 05/31/2024 1407 Last data filed at 05/31/2024 1100 Gross per 24 hour  Intake 846 ml  Output --  Net 846 ml    Exam Gen:- Awake Alert, no acute distress  HEENT:- [place].AT, No sclera icterus, right frontal/periorbital area ecchymosis, hemostatic Neck-Supple Neck,No JVD,.  Lungs-  CTAB , good air movement bilaterally CV- S1, S2 normal, regular Abd-  +ve B.Sounds, Abd Soft, No  tenderness,    Extremity/Skin:- No  edema,   good pulses Psych-affect is appropriate, forgetful, obvious memory and cognitive deficits Neuro-generalized weakness, no new focal deficits, no tremors    Data Review   CBC w Diff:  Lab Results  Component Value Date   WBC 11.7 (H) 05/30/2024   HGB 10.9 (L) 05/30/2024   HGB 13.8 02/08/2020   HGB 13.0 02/18/2015   HCT 34.1 (L) 05/30/2024   HCT 42.2 02/08/2020   HCT 38.7 02/18/2015   PLT 257 05/30/2024   PLT 299 02/08/2020   LYMPHOPCT 6 05/30/2024   LYMPHOPCT 13.6 (L) 02/18/2015   MONOPCT 4 05/30/2024   MONOPCT 6.4 02/18/2015   EOSPCT 0 05/30/2024   EOSPCT 0.9 02/18/2015   BASOPCT 0 05/30/2024   BASOPCT 0.5 02/18/2015   CMP:  Lab Results  Component Value Date   NA 140 05/30/2024   NA 144 02/08/2020   NA 143 02/18/2015   K 4.1 05/30/2024   K 4.1 02/18/2015   CL 104 05/30/2024   CL 105 03/25/2013   CO2 26 05/30/2024   CO2 27 02/18/2015   BUN 25 (H) 05/30/2024   BUN 15 02/08/2020   BUN 13.4 02/18/2015   CREATININE 0.76 05/30/2024   CREATININE 0.71 11/13/2017   CREATININE 0.7 02/18/2015   PROT 6.6 05/30/2024   PROT 7.5 02/08/2020   PROT 6.9 02/18/2015   ALBUMIN 3.9 05/30/2024   ALBUMIN 5.0 (H) 02/08/2020   ALBUMIN 4.1 02/18/2015   BILITOT 1.3 (H) 05/30/2024   BILITOT 1.5 (H) 02/08/2020   BILITOT 0.86 02/18/2015   ALKPHOS 89 05/30/2024   ALKPHOS 79 02/18/2015   AST 26 05/30/2024   AST 20 02/18/2015   ALT 14 05/30/2024   ALT 14 02/18/2015   Total Discharge time is about 33 minutes  Rendall Carwin M.D on 05/31/2024 at 2:07 PM  Go to [email or web address] -  for contact info  [facility] - Office  [phone]

## 2024-05-31 NOTE — Progress Notes (Signed)
 PT Cancellation Note  Patient Details Name: Crystal [name] MRN: [id number] DOB: October 11, 1946   Cancelled Treatment:    Reason Eval/Treat Not Completed: Patient's level of consciousness  Therapist is not able to arouse pt; nursing states pt had medication at 6:30 to sleep as she had not slept the whole night.  Montie Metro, PT CLT [phone]  05/31/2024, 9:45 AM

## 2024-05-31 NOTE — Progress Notes (Signed)
 Patient had a sitter providing 1:1 care for her during the night for safety as this patient has constantly attempted to get OOB and trying to gather her purse to go home. Patient if fixated on going home. Pt tried to stay up for most of the night watching the clock so that when the sun came up this am, she would know it would be time to find her car keys so she could drive home. Pt is easily redirected and doesn't become upset when she is reminded she has to stay a little longer until the MD states that she is well enough to go home. Pt doesn't argue. Pt has however been picking at the scabs on bilateral ears where her earrings were at and on the contusion/laceration to her R eyebrow causing both areas to bleed. Pt did have a dressing on her R eye at the beginning of the shift but the patients sitter stated that she had removed it herself (the patient that is) during the night. This Clinical research associate cleansed all areas and covered areas with a foam dressing that has been cut to fit. Pt promised this Clinical research associate that she would not be picking at areas or removing the dressings that this writer just placed over wounds this am. Pt did have a low grade temp of 100.1 and did c/o of generalized soreness likely r/t fall so pt was medicated with PRN Tylenol  as well as PRN Xanax  for anxiety as pt was worked up and anxious about going home along with presenting with anxiety like behaviors such as picking at skin/scabs. Sitter remains at bedside. Pt remains in bed with call light within reach and bed alarm on and active.
